# Patient Record
Sex: Female | Born: 1965
Health system: Southern US, Community
[De-identification: ages and names within clinical notes are randomized; demographics above are authoritative.]

## PROBLEM LIST (undated history)

## (undated) DIAGNOSIS — C220 Liver cell carcinoma: Secondary | ICD-10-CM

## (undated) DIAGNOSIS — R519 Headache, unspecified: Secondary | ICD-10-CM

## (undated) DIAGNOSIS — D689 Coagulation defect, unspecified: Secondary | ICD-10-CM

## (undated) DIAGNOSIS — B1920 Unspecified viral hepatitis C without hepatic coma: Secondary | ICD-10-CM

## (undated) DIAGNOSIS — K7460 Unspecified cirrhosis of liver: Secondary | ICD-10-CM

## (undated) DIAGNOSIS — R569 Unspecified convulsions: Secondary | ICD-10-CM

## (undated) DIAGNOSIS — F32A Depression, unspecified: Secondary | ICD-10-CM

## (undated) DIAGNOSIS — I10 Essential (primary) hypertension: Secondary | ICD-10-CM

## (undated) DIAGNOSIS — C3490 Malignant neoplasm of unspecified part of unspecified bronchus or lung: Secondary | ICD-10-CM

## (undated) DIAGNOSIS — F445 Conversion disorder with seizures or convulsions: Secondary | ICD-10-CM

## (undated) DIAGNOSIS — G8929 Other chronic pain: Secondary | ICD-10-CM

## (undated) DIAGNOSIS — F319 Bipolar disorder, unspecified: Secondary | ICD-10-CM

## (undated) HISTORY — DX: Malignant neoplasm of unspecified part of unspecified bronchus or lung: C34.90

## (undated) HISTORY — DX: Coagulation defect, unspecified: D68.9

## (undated) HISTORY — DX: Depression, unspecified: F32.A

## (undated) HISTORY — DX: Essential (primary) hypertension: I10

## (undated) HISTORY — DX: Other chronic pain: G89.29

## (undated) HISTORY — DX: Headache, unspecified: R51.9

---

## 2016-11-26 ENCOUNTER — Emergency Department (HOSPITAL_COMMUNITY): Payer: Self-pay

## 2016-11-26 ENCOUNTER — Observation Stay (HOSPITAL_COMMUNITY)
Admission: EM | Admit: 2016-11-26 | Discharge: 2016-11-27 | Disposition: A | Discharge status: Home / Self Care | Payer: Self-pay | Attending: Internal Medicine | Admitting: Internal Medicine

## 2016-11-26 ENCOUNTER — Encounter (HOSPITAL_COMMUNITY): Payer: Self-pay

## 2016-11-26 DIAGNOSIS — I10 Essential (primary) hypertension: Secondary | ICD-10-CM | POA: Insufficient documentation

## 2016-11-26 DIAGNOSIS — F1210 Cannabis abuse, uncomplicated: Secondary | ICD-10-CM

## 2016-11-26 DIAGNOSIS — B1920 Unspecified viral hepatitis C without hepatic coma: Secondary | ICD-10-CM | POA: Diagnosis present

## 2016-11-26 DIAGNOSIS — R9082 White matter disease, unspecified: Secondary | ICD-10-CM | POA: Insufficient documentation

## 2016-11-26 DIAGNOSIS — R748 Abnormal levels of other serum enzymes: Secondary | ICD-10-CM | POA: Diagnosis present

## 2016-11-26 DIAGNOSIS — D696 Thrombocytopenia, unspecified: Secondary | ICD-10-CM | POA: Diagnosis present

## 2016-11-26 DIAGNOSIS — Z79899 Other long term (current) drug therapy: Secondary | ICD-10-CM | POA: Insufficient documentation

## 2016-11-26 DIAGNOSIS — D649 Anemia, unspecified: Secondary | ICD-10-CM

## 2016-11-26 DIAGNOSIS — R531 Weakness: Secondary | ICD-10-CM

## 2016-11-26 DIAGNOSIS — G451 Carotid artery syndrome (hemispheric): Secondary | ICD-10-CM

## 2016-11-26 DIAGNOSIS — F17200 Nicotine dependence, unspecified, uncomplicated: Secondary | ICD-10-CM | POA: Insufficient documentation

## 2016-11-26 DIAGNOSIS — Z833 Family history of diabetes mellitus: Secondary | ICD-10-CM | POA: Insufficient documentation

## 2016-11-26 DIAGNOSIS — G43409 Hemiplegic migraine, not intractable, without status migrainosus: Principal | ICD-10-CM

## 2016-11-26 DIAGNOSIS — F445 Conversion disorder with seizures or convulsions: Secondary | ICD-10-CM | POA: Insufficient documentation

## 2016-11-26 DIAGNOSIS — Z885 Allergy status to narcotic agent status: Secondary | ICD-10-CM | POA: Insufficient documentation

## 2016-11-26 DIAGNOSIS — K7460 Unspecified cirrhosis of liver: Secondary | ICD-10-CM | POA: Diagnosis present

## 2016-11-26 DIAGNOSIS — G43909 Migraine, unspecified, not intractable, without status migrainosus: Secondary | ICD-10-CM

## 2016-11-26 DIAGNOSIS — Z886 Allergy status to analgesic agent status: Secondary | ICD-10-CM | POA: Insufficient documentation

## 2016-11-26 HISTORY — DX: Unspecified cirrhosis of liver: K74.60

## 2016-11-26 HISTORY — DX: Unspecified viral hepatitis C without hepatic coma: B19.20

## 2016-11-26 HISTORY — DX: Unspecified convulsions: R56.9

## 2016-11-26 HISTORY — DX: Conversion disorder with seizures or convulsions: F44.5

## 2016-11-26 LAB — COMPREHENSIVE METABOLIC PANEL
ALT: 135 U/L — ABNORMAL HIGH (ref 14–54)
ANION GAP: 10 (ref 5–15)
AST: 119 U/L — AB (ref 15–41)
Albumin: 3.6 g/dL (ref 3.5–5.0)
Alkaline Phosphatase: 99 U/L (ref 38–126)
BILIRUBIN TOTAL: 1.3 mg/dL — AB (ref 0.3–1.2)
BUN: 11 mg/dL (ref 6–20)
CHLORIDE: 106 mmol/L (ref 101–111)
CO2: 22 mmol/L (ref 22–32)
Calcium: 9 mg/dL (ref 8.9–10.3)
Creatinine, Ser: 0.8 mg/dL (ref 0.44–1.00)
GFR calc Af Amer: >60 mL/min (ref >60)
GFR calc non Af Amer: >60 mL/min (ref >60)
GLUCOSE: 89 mg/dL (ref 65–99)
POTASSIUM: 3.6 mmol/L (ref 3.5–5.1)
Sodium: 138 mmol/L (ref 135–145)
TOTAL PROTEIN: 6.6 g/dL (ref 6.5–8.1)

## 2016-11-26 LAB — I-STAT CHEM 8, ED
BUN: 12 mg/dL (ref 6–20)
CALCIUM ION: 1.09 mmol/L — AB (ref 1.15–1.40)
Chloride: 104 mmol/L (ref 101–111)
Creatinine, Ser: 0.7 mg/dL (ref 0.44–1.00)
Glucose, Bld: 88 mg/dL (ref 65–99)
HEMATOCRIT: 36 % (ref 36.0–46.0)
Hemoglobin: 12.2 g/dL (ref 12.0–15.0)
Potassium: 3.7 mmol/L (ref 3.5–5.1)
SODIUM: 143 mmol/L (ref 135–145)
TCO2: 26 mmol/L (ref 0–100)

## 2016-11-26 LAB — DIFFERENTIAL
BASOS ABS: 0 10*3/uL (ref 0.0–0.1)
BASOS PCT: 0 %
EOS ABS: 0.1 10*3/uL (ref 0.0–0.7)
EOS PCT: 2 %
LYMPHS ABS: 3.7 10*3/uL (ref 0.7–4.0)
Lymphocytes Relative: 68 %
Monocytes Absolute: 0.3 10*3/uL (ref 0.1–1.0)
Monocytes Relative: 6 %
NEUTROS PCT: 25 %
Neutro Abs: 1.3 10*3/uL — ABNORMAL LOW (ref 1.7–7.7)

## 2016-11-26 LAB — CBC
HCT: 34.7 % — ABNORMAL LOW (ref 36.0–46.0)
HEMOGLOBIN: 12.2 g/dL (ref 12.0–15.0)
MCH: 32.4 pg (ref 26.0–34.0)
MCHC: 35.2 g/dL (ref 30.0–36.0)
MCV: 92.3 fL (ref 78.0–100.0)
PLATELETS: 55 10*3/uL — AB (ref 150–400)
RBC: 3.76 MIL/uL — ABNORMAL LOW (ref 3.87–5.11)
RDW: 13.6 % (ref 11.5–15.5)
WBC: 5.4 10*3/uL (ref 4.0–10.5)

## 2016-11-26 LAB — CBG MONITORING, ED: GLUCOSE-CAPILLARY: 89 mg/dL (ref 65–99)

## 2016-11-26 LAB — APTT: APTT: 34 s (ref 24–36)

## 2016-11-26 LAB — I-STAT TROPONIN, ED: TROPONIN I, POC: 0 ng/mL (ref 0.00–0.08)

## 2016-11-26 LAB — AMMONIA: Ammonia: 30 umol/L (ref 9–35)

## 2016-11-26 LAB — PROTIME-INR
INR: 1.1
PROTHROMBIN TIME: 14.2 s (ref 11.4–15.2)

## 2016-11-26 MED ORDER — STROKE: EARLY STAGES OF RECOVERY BOOK
Freq: Once | Status: DC
  Filled 2016-11-26: qty 1

## 2016-11-26 MED ORDER — ACETAMINOPHEN 160 MG/5ML PO SOLN: 650.0000 mg | ORAL | Status: DC | PRN

## 2016-11-26 MED ORDER — ACETAMINOPHEN 650 MG RE SUPP: 650.0000 mg | RECTAL | Status: DC | PRN

## 2016-11-26 MED ORDER — SENNOSIDES-DOCUSATE SODIUM 8.6-50 MG PO TABS
1.0000 | ORAL_TABLET | Freq: Every evening | ORAL | Status: DC | PRN
  Filled 2016-11-26: qty 1

## 2016-11-26 MED ORDER — SODIUM CHLORIDE 0.9 % IV SOLN
INTRAVENOUS | Status: DC
  Administered 2016-11-27: 04:00:00 via INTRAVENOUS

## 2016-11-26 MED ORDER — ACETAMINOPHEN 325 MG PO TABS: 650.0000 mg | ORAL_TABLET | ORAL | Status: DC | PRN

## 2016-11-26 MED ORDER — ASPIRIN 300 MG RE SUPP: 300.0000 mg | Freq: Every day | RECTAL | Status: DC

## 2016-11-26 MED ORDER — ASPIRIN 325 MG PO TABS
325.0000 mg | ORAL_TABLET | Freq: Every day | ORAL | Status: DC
  Administered 2016-11-27: 325 mg via ORAL
  Filled 2016-11-26: qty 1

## 2016-11-26 NOTE — ED Notes (Signed)
Code Stroke cancelled @ 2049

## 2016-11-26 NOTE — ED Triage Notes (Signed)
Pt comes via [facility]. LSN 2007 R sided facial spasm and R sided weakness with abnormal gait and leg cramps today. Facial spasm resolved upon EMS arrival.

## 2016-11-26 NOTE — ED Provider Notes (Signed)
[facility] DEPT Provider Note   CSN: [id number] Arrival date & time: 11/26/16  2041   An emergency department physician performed an initial assessment on this suspected stroke patient at 2041.  History   Chief Complaint Chief Complaint  Patient presents with  . Transient Ischemic Attack    HPI Valerie [name] is a 51 y.o. female.  HPI  51 year old female history of cirrhosis and pseudoseizures who presents today via EMS as code stroke. Port is that last known normal was 8 PM. EMS was called and she has resolution of symptoms on presentation to ED. She is initially seen at the bridge with the neurologist. He cleared her as a code stroke and feels that she needs to be evaluated as a [place].  Report is that she had leg cramping and difficulty walking earlier today. She then had right-sided leg, arm and face weakness.  She is complaining of paresthesia of the rue.    Past Medical History:  Diagnosis Date  . Liver cirrhosis ([place])   . Pseudoseizures     There are no active problems to display for this patient.   Past Surgical History:  Procedure Laterality Date  . CESAREAN SECTION      OB History    No data available       Home Medications    Prior to Admission medications   Not on File    Family History No family history on file.  Social History Social History  Substance Use Topics  . Smoking status: Current Every Day Smoker  . Smokeless tobacco: Never Used  . Alcohol use No     Allergies   Patient has no known allergies.   Review of Systems Review of Systems  All other systems reviewed and are negative.    Physical Exam Updated Vital Signs BP (!) 161/97   Pulse 72   Temp 97.8 F (36.6 C)   Resp 18   Ht 1.676 m (5\' 6" )   Wt 66.7 kg (147 lb)   SpO2 100%   BMI 23.73 kg/m   Physical Exam  Constitutional: She is oriented to person, place, and time. She appears well-developed and well-nourished. No distress.  HENT:  Head: Normocephalic and  atraumatic.  Right Ear: External ear normal.  Left Ear: External ear normal.  Nose: Nose normal.  Mouth/Throat: Oropharynx is clear and moist.  Eyes: Pupils are equal, round, and reactive to light. EOM are normal.  Neck: Normal range of motion. Neck supple.  Cardiovascular: Normal rate and regular rhythm.   Pulmonary/Chest: Effort normal and breath sounds normal.  Abdominal: Soft. Bowel sounds are normal.  Musculoskeletal: Normal range of motion.  Neurological: She is alert and oriented to person, place, and time. She displays normal reflexes. No cranial nerve deficit or sensory deficit. She exhibits normal muscle tone. Coordination normal.  Patient with some decreased strength rle >lle, mild palmar drift. No sensory deficit  Skin: Skin is warm and dry. Capillary refill takes less than 2 seconds.  Psychiatric: She has a normal mood and affect.  Nursing note and vitals reviewed.    ED Treatments / Results  Labs (all labs ordered are listed, but only abnormal results are displayed) Labs Reviewed  CBC - Abnormal; Notable for the following:       Result Value   RBC 3.76 (*)    HCT 34.7 (*)    All other components within normal limits  DIFFERENTIAL - Abnormal; Notable for the following:    Neutro Abs 1.3 (*)  All other components within normal limits  COMPREHENSIVE METABOLIC PANEL - Abnormal; Notable for the following:    AST 119 (*)    ALT 135 (*)    Total Bilirubin 1.3 (*)    All other components within normal limits  I-STAT CHEM 8, ED - Abnormal; Notable for the following:    Calcium, Ion 1.09 (*)    All other components within normal limits  APTT  PROTIME-INR  AMMONIA  I-STAT TROPONIN, ED  CBG MONITORING, ED    EKG  EKG Interpretation  Date/Time:  Saturday November 26 2016 21:04:51 EDT Ventricular Rate:  70 PR Interval:    QRS Duration: 93 QT Interval:  439 QTC Calculation: 474 R Axis:   117 Text Interpretation:  Right and left arm electrode reversal,  interpretation assumes no reversal Sinus or ectopic atrial rhythm Short PR interval Probable lateral infarct, age indeterminate Confirmed by Pattricia Boss [phone]) on 11/26/2016 10:20:49 PM       Radiology Ct Head Code Stroke Wo Contrast  Result Date: 11/26/2016 CLINICAL DATA:  Code stroke. Facial droop and facial weakness. Code stroke. EXAM: CT HEAD WITHOUT CONTRAST TECHNIQUE: Contiguous axial images were obtained from the base of the skull through the vertex without intravenous contrast. COMPARISON:  None. FINDINGS: Brain: The brain has normal appearance without evidence of atrophy, old or acute infarction, mass lesion, hemorrhage, hydrocephalus or extra-axial collection. Vascular: There is atherosclerotic calcification of the major vessels at the base of the brain. Skull: Normal Sinuses/Orbits: Clear/normal Other: None ASPECTS ([place] Stroke Program Early CT Score) - Ganglionic level infarction (caudate, lentiform nuclei, internal capsule, insula, M1-M3 cortex): 7 - Supraganglionic infarction (M4-M6 cortex): 3 Total score (0-10 with 10 being normal): 10 IMPRESSION: 1. Normal parenchymal head CT. The patient does have some calcification of the major vessels at the base of the brain, premature for age. 2. ASPECTS is 10. Page placed at the time of interpretation on 11/26/2016 at 9:00 pm to Dr. Cheral Marker. Electronically Signed   By: Nelson Chimes M.D.   On: 11/26/2016 21:02    Procedures Procedures (including critical care time)  Medications Ordered in ED Medications - No data to display   Initial Impression / Assessment and Plan / ED Course  I have reviewed the triage vital signs and the nursing notes.  Pertinent labs & imaging results that were available during my care of the patient were reviewed by me and considered in my medical decision making (see chart for details).     51 year old female presents with episode of right-sided weakness. Symptoms had resolved prior to evaluation although  appears to have some mild right upper extremity and right lower extremity weakness patient seen and evaluated by neurology on arrival. Dr. Cheral Marker advises that no acute intervention is indicated-specifically TPA. On my reevaluation, patient appears to have no acute neurological deficit. Review of head CT shows normal parenchymal head CT, although some calcification of the major vessels at the base of the brain premature for age.   Vitals:   11/26/16 2200 11/26/16 2230  BP: (!) 115/59 111/76  Pulse: 72 74  Resp: 15 18  Temp:    SpO2: 97% 98%   Consult placed for mission for TIA evaluation. Final Clinical Impressions(s) / ED Diagnoses   Final diagnoses:  Hemispheric carotid artery syndrome    New Prescriptions New Prescriptions   No medications on file     Pattricia Boss, MD 11/27/16 1614

## 2016-11-26 NOTE — H&P (Signed)
History and Physical    [name] KWI:[id number] DOB: 11/08/1967 DOA: 11/26/2016  Referring MD/NP/PA: Dr. Pattricia Boss PCP: No primary care provider on file.  Patient coming from:   via EMS   Chief Complaint: Right-sided weakness  HPI: Valerie [name] is a 51 y.o. female with medical history significant of pseudo-seizures, hepatitis C, liver cirrhosis; who presents with complaints of right-sided weakness which start while she was moving out of her current place of residence sometime after 8 PM last night. Patient is a poor historian. She reports having facial spasms on the right as well as inability to move her right arm or leg. Denies any alcohol or illicit drug use. She did not report any palpitations, loss of consciousness, change in vision, or speech.  She reports having a history of hepatitis C for which she states she's been treated previously at [facility] in [place], [place] , and/or [facility]. Denies having any nausea, vomiting, or bleeding.  ED Course: Patient was brought in with EMS as a code stroke. Upon admission to the emergency department patient was seen to be afebrile with vital signs relatively within normal limits. Dr. Cheral Marker of neurology saw the patient and some of patient's symptoms were improving. It was recommended TRH admit for completion of TIA workup. Labs revealed plate count of 55, AST 119, ALT 135, total bilirubin 1.3. CT scan of the brain showed no acute abnormalities.  Review of Systems: Review of Systems  Constitutional: Negative for chills, fever and malaise/fatigue.  HENT: Negative for ear pain and tinnitus.   Eyes: Negative for double vision.  Respiratory: Negative for sputum production and shortness of breath.   Cardiovascular: Positive for chest pain. Negative for leg swelling.  Gastrointestinal: Negative for abdominal pain, diarrhea and vomiting.  Genitourinary: Negative for dysuria and flank pain.  Musculoskeletal: Negative for falls.  Skin:  Negative for itching and rash.  Neurological: Positive for sensory change and focal weakness.  Psychiatric/Behavioral: Negative for memory loss and substance abuse.    Past Medical History:  Diagnosis Date  . Liver cirrhosis ([place])   . Pseudoseizures     Past Surgical History:  Procedure Laterality Date  . CESAREAN SECTION       reports that she has been smoking.  She has never used smokeless tobacco. She reports that she does not drink alcohol or use drugs.  No Known Allergies  Family History  Problem Relation Age of Onset  . Heart disease Neg Hx   Positive family history for diabetes  Prior to Admission medications   None     Physical Exam:  Constitutional: Female who appears older than stated age is currently resting, but easily awakened and will follow commands. Vitals:   11/26/16 2130 11/26/16 2200 11/26/16 2230 11/26/16 2241  BP: 131/78 (!) 115/59 111/76   Pulse: 75 72 74   Resp: 15 15 18    Temp:    97.6 F (36.4 C)  SpO2: 97% 97% 98%   Weight:      Height:       Eyes: PERRL, lids and conjunctivae normal ENMT: Mucous membranes are moist. Posterior pharynx clear of any exudate or lesions.Poor dentition.  Neck: normal, supple, no masses, no thyromegaly Respiratory: clear to auscultation bilaterally, no wheezing, no crackles. Normal respiratory effort. No accessory muscle use.  Cardiovascular: Regular rate and rhythm, no murmurs / rubs / gallops. No extremity edema. 2+ pedal pulses. No carotid bruits.  Abdomen: no tenderness, no masses palpated. No hepatosplenomegaly. Bowel sounds positive.  Musculoskeletal: no clubbing / cyanosis. No joint deformity upper and lower extremities. Good ROM, no contractures. Normal muscle tone.  Skin: no rashes, lesions, ulcers. No induration Neurologic: CN 2-12 grossly intact. Sensation intact, DTR normal. Strength 5/5 on the left upper and lower extremity. Strength 4/5 on the right upper extremity and 4/5 on the left lower  extremity with positive drift. Psychiatric: Normal judgment and insight. Alert and oriented x 3. Normal mood.     Labs on Admission: I have personally reviewed following labs and imaging studies  CBC:  Recent Labs Lab 11/26/16 2043 11/26/16 2055  WBC 5.4  --   NEUTROABS 1.3*  --   HGB 12.2 12.2  HCT 34.7* 36.0  MCV 92.3  --   PLT 55*  --    Basic Metabolic Panel:  Recent Labs Lab 11/26/16 2043 11/26/16 2055  NA 138 143  K 3.6 3.7  CL 106 104  CO2 22  --   GLUCOSE 89 88  BUN 11 12  CREATININE 0.80 0.70  CALCIUM 9.0  --    GFR: Estimated Creatinine Clearance: 79.6 mL/min (by C-G formula based on SCr of 0.7 mg/dL). Liver Function Tests:  Recent Labs Lab 11/26/16 2043  AST 119*  ALT 135*  ALKPHOS 99  BILITOT 1.3*  PROT 6.6  ALBUMIN 3.6   No results for input(s): LIPASE, AMYLASE in the last 168 hours.  Recent Labs Lab 11/26/16 2052  AMMONIA 30   Coagulation Profile:  Recent Labs Lab 11/26/16 2039  INR 1.10   Cardiac Enzymes: No results for input(s): CKTOTAL, CKMB, CKMBINDEX, TROPONINI in the last 168 hours. BNP (last 3 results) No results for input(s): PROBNP in the last 8760 hours. HbA1C: No results for input(s): HGBA1C in the last 72 hours. CBG:  Recent Labs Lab 11/26/16 2044  GLUCAP 89   Lipid Profile: No results for input(s): CHOL, HDL, LDLCALC, TRIG, CHOLHDL, LDLDIRECT in the last 72 hours. Thyroid Function Tests: No results for input(s): TSH, T4TOTAL, FREET4, T3FREE, THYROIDAB in the last 72 hours. Anemia Panel: No results for input(s): VITAMINB12, FOLATE, FERRITIN, TIBC, IRON, RETICCTPCT in the last 72 hours. Urine analysis: No results found for: COLORURINE, APPEARANCEUR, LABSPEC, PHURINE, GLUCOSEU, HGBUR, BILIRUBINUR, KETONESUR, PROTEINUR, UROBILINOGEN, NITRITE, LEUKOCYTESUR Sepsis Labs: No results found for this or any previous visit (from the past 240 hour(s)).   Radiological Exams on Admission: Ct Head Code Stroke Wo  Contrast  Result Date: 11/26/2016 CLINICAL DATA:  Code stroke. Facial droop and facial weakness. Code stroke. EXAM: CT HEAD WITHOUT CONTRAST TECHNIQUE: Contiguous axial images were obtained from the base of the skull through the vertex without intravenous contrast. COMPARISON:  None. FINDINGS: Brain: The brain has normal appearance without evidence of atrophy, old or acute infarction, mass lesion, hemorrhage, hydrocephalus or extra-axial collection. Vascular: There is atherosclerotic calcification of the major vessels at the base of the brain. Skull: Normal Sinuses/Orbits: Clear/normal Other: None ASPECTS ([place] Stroke Program Early CT Score) - Ganglionic level infarction (caudate, lentiform nuclei, internal capsule, insula, M1-M3 cortex): 7 - Supraganglionic infarction (M4-M6 cortex): 3 Total score (0-10 with 10 being normal): 10 IMPRESSION: 1. Normal parenchymal head CT. The patient does have some calcification of the major vessels at the base of the brain, premature for age. 2. ASPECTS is 10. Page placed at the time of interpretation on 11/26/2016 at 9:00 pm to Dr. Cheral Marker. Electronically Signed   By: Nelson Chimes M.D.   On: 11/26/2016 21:02    EKG: Independently reviewed. Sinus rhythm  Assessment/Plan  Right-sided weakness: Acute. Patient presents with right-sided weakness similar symptoms appear to be resolving at this time. Initial CT scan of brain negative for any signs of acute stroke. Neurology was consulted and recommended completion of stroke workup. - Admit to telemetry bed - Stroke order set initiated - Neuro checks -  Follow up  MRI  head w/o contrast - PT/OT/Speech to eval and treat - Check echocardiogram and Vas carotid U/S - Check Hemoglobin A1c and lipid panel in a.m. - ASA - Appreciate neurology consultative services, will follow-up   History of hepatitis C, Liver cirrhosis with elevated liver enzymes: Patient presents with elevated AST, ALT, and total bilirubin on admission.  Ammonia level noted to be within normal limits. Unable to find outside records on car everywhere.  - Check acute hepatitis panel - May warrant further investigation if unable to find outside records  Thrombocytopenia: Initial platelet count 55. No previous lab work available at this time to compare. - Recheck CBC in a.m.  DVT prophylaxis: SCDs Code Status: Full Family Communication: Discuss plan of care with the patient and family present at bedside Disposition Plan: TBD Consults called: Neurology  Admission status: Observation  Norval Morton MD [facility] Pager [phone]   If 7PM-7AM, please contact night-coverage [email or web address] Password [email or web address]  11/26/2016, 11:19 PM

## 2016-11-27 ENCOUNTER — Observation Stay (HOSPITAL_COMMUNITY): Payer: Self-pay

## 2016-11-27 ENCOUNTER — Encounter (HOSPITAL_COMMUNITY): Payer: Self-pay | Admitting: Internal Medicine

## 2016-11-27 DIAGNOSIS — R531 Weakness: Secondary | ICD-10-CM

## 2016-11-27 DIAGNOSIS — K7460 Unspecified cirrhosis of liver: Secondary | ICD-10-CM | POA: Diagnosis present

## 2016-11-27 DIAGNOSIS — R748 Abnormal levels of other serum enzymes: Secondary | ICD-10-CM | POA: Diagnosis present

## 2016-11-27 DIAGNOSIS — G451 Carotid artery syndrome (hemispheric): Secondary | ICD-10-CM

## 2016-11-27 DIAGNOSIS — D649 Anemia, unspecified: Secondary | ICD-10-CM

## 2016-11-27 DIAGNOSIS — G43909 Migraine, unspecified, not intractable, without status migrainosus: Secondary | ICD-10-CM

## 2016-11-27 DIAGNOSIS — F1210 Cannabis abuse, uncomplicated: Secondary | ICD-10-CM

## 2016-11-27 DIAGNOSIS — G43409 Hemiplegic migraine, not intractable, without status migrainosus: Secondary | ICD-10-CM

## 2016-11-27 DIAGNOSIS — D696 Thrombocytopenia, unspecified: Secondary | ICD-10-CM

## 2016-11-27 DIAGNOSIS — B1920 Unspecified viral hepatitis C without hepatic coma: Secondary | ICD-10-CM

## 2016-11-27 LAB — CBC WITH DIFFERENTIAL/PLATELET
BASOS ABS: 0 10*3/uL (ref 0.0–0.1)
BASOS PCT: 0 %
EOS ABS: 0.1 10*3/uL (ref 0.0–0.7)
Eosinophils Relative: 1 %
HCT: 34.3 % — ABNORMAL LOW (ref 36.0–46.0)
HEMOGLOBIN: 11.7 g/dL — AB (ref 12.0–15.0)
Lymphocytes Relative: 74 %
Lymphs Abs: 4.3 10*3/uL — ABNORMAL HIGH (ref 0.7–4.0)
MCH: 31 pg (ref 26.0–34.0)
MCHC: 34.1 g/dL (ref 30.0–36.0)
MCV: 91 fL (ref 78.0–100.0)
Monocytes Absolute: 0.4 10*3/uL (ref 0.1–1.0)
Monocytes Relative: 6 %
NEUTROS PCT: 19 %
Neutro Abs: 1.1 10*3/uL — ABNORMAL LOW (ref 1.7–7.7)
Platelets: 52 10*3/uL — ABNORMAL LOW (ref 150–400)
RBC: 3.77 MIL/uL — ABNORMAL LOW (ref 3.87–5.11)
RDW: 13.5 % (ref 11.5–15.5)
WBC: 5.8 10*3/uL (ref 4.0–10.5)

## 2016-11-27 LAB — RAPID URINE DRUG SCREEN, HOSP PERFORMED
Amphetamines: NOT DETECTED
Barbiturates: NOT DETECTED
Benzodiazepines: NOT DETECTED
COCAINE: NOT DETECTED
OPIATES: NOT DETECTED
TETRAHYDROCANNABINOL: POSITIVE — AB

## 2016-11-27 LAB — TROPONIN I: Troponin I: <0.03 ng/mL (ref <0.03)

## 2016-11-27 MED ORDER — KETOROLAC TROMETHAMINE 30 MG/ML IJ SOLN
30.0000 mg | Freq: Once | INTRAMUSCULAR | Status: AC
  Administered 2016-11-27: 30 mg via INTRAVENOUS
  Filled 2016-11-27: qty 1

## 2016-11-27 MED ORDER — ASPIRIN EC 81 MG PO TBEC: 81.0000 mg | DELAYED_RELEASE_TABLET | Freq: Every day | ORAL | 0 refills | Status: AC

## 2016-11-27 MED ORDER — MAGNESIUM SULFATE IN D5W 1-5 GM/100ML-% IV SOLN
1.0000 g | Freq: Once | INTRAVENOUS | Status: AC
  Administered 2016-11-27: 1 g via INTRAVENOUS
  Filled 2016-11-27: qty 100

## 2016-11-27 NOTE — ED Notes (Signed)
Neurologist at bedside. 

## 2016-11-27 NOTE — Consult Note (Addendum)
Referring Physician: Dr. Tamala Julian    Chief Complaint: Right facial droop with RUE and RLE weakness  HPI: Valerie [name] is an 51 y.o. female who was in her USOH until 8:07 PM, when her daughter noticed a right sided facial spasm in conjunction with abnormal gait. The patient states that with the above, her RUE and RLE were weak. She notes that she has had bilateral leg cramps today. Symptoms of right sided weakness were still present during EMS transport, but had resolved upon arrival to the ED, with subtle residual RLE weakness detectable on exam.   LSN: 2007 tPA Given: No: Symptoms resolved except for minimal residual RLE deficit  Past Medical History:  Diagnosis Date  . Liver cirrhosis ([place])   . Pseudoseizures     Past Surgical History:  Procedure Laterality Date  . CESAREAN SECTION      No family history on file. Social History:  reports that she has been smoking.  She has never used smokeless tobacco. She reports that she does not drink alcohol or use drugs.  Allergies: No Known Allergies  Home Medications:  No home medications listed in EPIC  ROS: RUE paresthesias. Other ROS as per HPI,  Physical Examination: Blood pressure 118/71, pulse 69, temperature 97.6 F (36.4 C), resp. rate 16, height '5\' 6"'$  (1.676 m), weight 66.7 kg (147 lb), SpO2 98 %.  HEENT: [place]/AT Lungs: Respirations unlabored Ext: No edema  Neurologic Examination: Mental Status: Alert, oriented, thought content appropriate.  Speech fluent without evidence of aphasia.  Able to follow all commands without difficulty. Cranial Nerves: II:  Visual fields intact, PERRL  III,IV, VI: ptosis not present, EOMI without nystagmus V,VII: smile symmetric, facial temp sensation normal bilaterally VIII: hearing intact to conversation IX,X: no hypophonia XI: symmetric XII: midline tongue extension  Motor: Right : Upper extremity   5/5    Left:     Upper extremity   5/5  Lower extremity   4+/5     Lower extremity    5/5 Tone and bulk: Normal tone throughout; no atrophy noted Sensory: Temp and light touch intact throughout, bilaterally. No extinction. Deep Tendon Reflexes:  2+ bilateral upper and lower extremities. Toes downgoing bilaterally.  Cerebellar: No ataxia with FNF bilaterally. Gait: Deferred  Results for orders placed or performed during the hospital encounter of 11/26/16 (from the past 48 hour(s))  Protime-INR     Status: None   Collection Time: 11/26/16  8:39 PM  Result Value Ref Range   Prothrombin Time 14.2 11.4 - 15.2 seconds   INR 1.10   APTT     Status: None   Collection Time: 11/26/16  8:43 PM  Result Value Ref Range   aPTT 34 24 - 36 seconds  CBC     Status: Abnormal   Collection Time: 11/26/16  8:43 PM  Result Value Ref Range   WBC 5.4 4.0 - 10.5 K/uL   RBC 3.76 (L) 3.87 - 5.11 MIL/uL   Hemoglobin 12.2 12.0 - 15.0 g/dL   HCT 34.7 (L) 36.0 - 46.0 %   MCV 92.3 78.0 - 100.0 fL   MCH 32.4 26.0 - 34.0 pg   MCHC 35.2 30.0 - 36.0 g/dL   RDW 13.6 11.5 - 15.5 %   Platelets 55 (L) 150 - 400 K/uL    Comment: REPEATED TO VERIFY PLATELET COUNT CONFIRMED BY SMEAR   Differential     Status: Abnormal   Collection Time: 11/26/16  8:43 PM  Result Value Ref Range  Neutrophils Relative % 25 %   Neutro Abs 1.3 (L) 1.7 - 7.7 K/uL   Lymphocytes Relative 68 %   Lymphs Abs 3.7 0.7 - 4.0 K/uL   Monocytes Relative 6 %   Monocytes Absolute 0.3 0.1 - 1.0 K/uL   Eosinophils Relative 2 %   Eosinophils Absolute 0.1 0.0 - 0.7 K/uL   Basophils Relative 0 %   Basophils Absolute 0.0 0.0 - 0.1 K/uL  Comprehensive metabolic panel     Status: Abnormal   Collection Time: 11/26/16  8:43 PM  Result Value Ref Range   Sodium 138 135 - 145 mmol/L   Potassium 3.6 3.5 - 5.1 mmol/L   Chloride 106 101 - 111 mmol/L   CO2 22 22 - 32 mmol/L   Glucose, Bld 89 65 - 99 mg/dL   BUN 11 6 - 20 mg/dL   Creatinine, Ser 0.80 0.44 - 1.00 mg/dL   Calcium 9.0 8.9 - 10.3 mg/dL   Total Protein 6.6 6.5 - 8.1 g/dL    Albumin 3.6 3.5 - 5.0 g/dL   AST 119 (H) 15 - 41 U/L   ALT 135 (H) 14 - 54 U/L   Alkaline Phosphatase 99 38 - 126 U/L   Total Bilirubin 1.3 (H) 0.3 - 1.2 mg/dL   GFR calc non Af Amer >60 >60 mL/min   GFR calc Af Amer >60 >60 mL/min    Comment: (NOTE) The eGFR has been calculated using the CKD EPI equation. This calculation has not been validated in all clinical situations. eGFR's persistently <60 mL/min signify possible Chronic Kidney Disease.    Anion gap 10 5 - 15  CBG monitoring, ED     Status: None   Collection Time: 11/26/16  8:44 PM  Result Value Ref Range   Glucose-Capillary 89 65 - 99 mg/dL  Ammonia     Status: None   Collection Time: 11/26/16  8:52 PM  Result Value Ref Range   Ammonia 30 9 - 35 umol/L  I-stat troponin, ED     Status: None   Collection Time: 11/26/16  8:53 PM  Result Value Ref Range   Troponin i, poc 0.00 0.00 - 0.08 ng/mL   Comment 3            Comment: Due to the release kinetics of cTnI, a negative result within the first hours of the onset of symptoms does not rule out myocardial infarction with certainty. If myocardial infarction is still suspected, repeat the test at appropriate intervals.   I-Stat Chem 8, ED     Status: Abnormal   Collection Time: 11/26/16  8:55 PM  Result Value Ref Range   Sodium 143 135 - 145 mmol/L   Potassium 3.7 3.5 - 5.1 mmol/L   Chloride 104 101 - 111 mmol/L   BUN 12 6 - 20 mg/dL   Creatinine, Ser 0.70 0.44 - 1.00 mg/dL   Glucose, Bld 88 65 - 99 mg/dL   Calcium, Ion 1.09 (L) 1.15 - 1.40 mmol/L   TCO2 26 0 - 100 mmol/L   Hemoglobin 12.2 12.0 - 15.0 g/dL   HCT 36.0 36.0 - 46.0 %   Ct Head Code Stroke Wo Contrast  Result Date: 11/26/2016 CLINICAL DATA:  Code stroke. Facial droop and facial weakness. Code stroke. EXAM: CT HEAD WITHOUT CONTRAST TECHNIQUE: Contiguous axial images were obtained from the base of the skull through the vertex without intravenous contrast. COMPARISON:  None. FINDINGS: Brain: The brain has  normal appearance without evidence of atrophy,  old or acute infarction, mass lesion, hemorrhage, hydrocephalus or extra-axial collection. Vascular: There is atherosclerotic calcification of the major vessels at the base of the brain. Skull: Normal Sinuses/Orbits: Clear/normal Other: None ASPECTS ([place] Stroke Program Early CT Score) - Ganglionic level infarction (caudate, lentiform nuclei, internal capsule, insula, M1-M3 cortex): 7 - Supraganglionic infarction (M4-M6 cortex): 3 Total score (0-10 with 10 being normal): 10 IMPRESSION: 1. Normal parenchymal head CT. The patient does have some calcification of the major vessels at the base of the brain, premature for age. 2. ASPECTS is 10. Page placed at the time of interpretation on 11/26/2016 at 9:00 pm to Dr. Cheral Marker. Electronically Signed   By: Nelson Chimes M.D.   On: 11/26/2016 21:02    Assessment: 51 y.o. female with acute onset of right facial droop with RUE and RLE weakness. 1. CT head without acute abnormality.  2. Hepatitis C with liver cirrhosis 3. History of pseudoseizures  Plan: 1. HgbA1c, fasting lipid panel 2. MRI, MRA of the brain without contrast 3. PT consult, OT consult, Speech consult 4. Echocardiogram 5. Carotid dopplers 6. Start ASA 81 mg po qd 7. Risk factor modification 8. Telemetry monitoring 9. Frequent neuro checks 10. Permissive HTN x 24 hours 11. Given Hepatitis C with liver cirrhosis, benefits of statin may be outweighed by risks  '@Electronically'$  signed: Dr. Kerney Elbe  11/27/2016, 12:27 AM

## 2016-11-27 NOTE — Discharge Summary (Signed)
Physician Discharge Summary  Valerie [name] ZOX:[id number] DOB: 11/08/1967 DOA: 11/26/2016  PCP: Patient, No Pcp Per  Admit date: 11/26/2016 Discharge date: 11/27/2016  Admitted From: Home Disposition:  Home  Recommendations for Outpatient Follow-up:  1. Follow up with and establish with PCP in 1-2 weeks; Case Management consulted to arrange Follow Up 2. Outpatient follow up for Elevated LFT's by getting U/S of the Abdomen and Acute Hepatitis Panel 3. Outpatient follow up and workup  for Thrombocytopenia and Normocytic Anemia 4. Please obtain CMP/CBC, Mag, Phos in one week 5. Follow up with Neurology as an outpatient. Ambulatory Referral to [facility] sent  Home Health: No  Equipment/Devices: None  Discharge Condition: Stable CODE STATUS: FULL CODE Diet recommendation: Heart Healthy Diet  Brief/Interim Summary: Valerie [name] is a 51 y.o. female with medical history significant of pseudo-seizures, hepatitis C, liver cirrhosis; who presents with complaints of right-sided weakness which start while she was moving out of her current place of residence sometime after 8 PM last night. Patient is a poor historian. She reports having facial spasms on the right as well as inability to move her right arm or leg. Denies any alcohol or illicit drug use. She did not report any palpitations, loss of consciousness, change in vision, or speech.  She reports having a history of hepatitis C for which she states she's been treated previously at [facility] in [place], [place] , and/or [facility]. Denies having any nausea, vomiting, or bleeding. Patient was brought in with EMS as a code stroke. Upon admission to the emergency department patient was seen to be afebrile with vital signs relatively within normal limits. Dr. Cheral Marker of neurology saw the patient and some of patient's symptoms were improving. It was recommended [facility] admit for completion of TIA workup. Labs revealed plate count of 55, AST 119, ALT 135, total  bilirubin 1.3. CT scan of the brain showed no acute abnormalities. MRI and MRA was negative. Neurology Dr. Lorraine Lax saw the patient and felt that it was not a TIA and felt as if her symptoms were related to a Complex Migraine. She was given IV Toradol and IV Magnesium with improvement of her symptoms. She was given ASA 81 mg script at D/C at the recommendation of Neurology. A referral to Ambulatory Neurology was made. Care Management was consulted to help get patient established with a PCP as patient had no insurance. She was deemed medically stable at this time as symptoms improved and will be D/C'd Home.   Discharge Diagnoses:  Principal Problem:   Right sided weakness Active Problems:   Hepatitis C   Thrombocytopenia (HCC)   Liver cirrhosis (HCC)   Elevated liver enzymes   Migraine   Hemiplegic migraine   Marijuana abuse   Normocytic anemia  Right-sided weakness 2/2 to Complex Migraine; Improved and essentially Resolved  -Acute. Patient presents with right-sided weakness similar symptoms appear to be resolving at this time. Initial CT scan of brain negative for any signs of acute stroke. Neurology was consulted and recommended completion of stroke workup. -MRI of Brain done and showed No acute intracranial process.  Minimal white matter changes most compatible with chronic small vessel ischemic disease -MRA showed symmetric carotid and vertebral arteries. Fetal type left PCA. No branch occlusion, beading, stenosis, or aneurysm and was read as a Negative intracranial MRA -Stroke order set initiated but Neurology Consulted and recommended D/Cing as MRI/MRA were negative -Given IV Ketorolac 30 mg and 1 gram of IV Magnesium with improvement in Symptoms -Neurology  recommending ASA 81 mg as an outpatient and no Migraine prophylaxis -Ambulatory referral made to [facility] Neurology   -Patient has Hx of Pseudoseizures -Hemoglobin A1c and lipid panel can be done as an outpatient -Care Management consulted  to have patient establish with PCP  History of Hepatitis C, Liver Cirrhosis With elevated Liver enzymes:  -Liver Cirrhosis does not seem Decompensated -Patient presents with elevated AST, ALT, and total bilirubin on admission.  -AST was 119, ALT was 135, and T Bili was 1.3 -Ammonia level noted to be within normal limits at 30. Unable to find outside records on care everywhere.  -Outpatient work up with Acute Hepatitis Panel and RUQ of Abdomen -Patient not complaining of any Abdominal Pain -Will need to establish with PCP and follow up within 1-2 weeks and Care Management consulted to assist in PCP follow Up  Thrombocytopenia: -Initial platelet count 55 and repeat was 52. No previous lab work available at this time to compare. -Not Actively Bleeding -Likely from Cirrhosis  -Establish with and follow up with PCP; Care Management Consulted to help. Per Nurse Care Management not here on weekends but will call patient to establish PCP follow up.  Marijuana Abuse -UDS Positive for THC -Counseling Given  Normocytic Anemia -Mild as Hb/Hct went from 12.2/34.7 -> 11.7/34.3 -Outpatient workup  -Will need to follow up with and Establish with PCP  Discharge Instructions  Discharge Instructions    Ambulatory referral to Neurology    Complete by:  As directed    An appointment is requested in approximately: 1-2 Weeks; Complex Migraine   Call MD for:  difficulty breathing, headache or visual disturbances    Complete by:  As directed    Call MD for:  extreme fatigue    Complete by:  As directed    Call MD for:  hives    Complete by:  As directed    Call MD for:  persistant dizziness or light-headedness    Complete by:  As directed    Call MD for:  persistant nausea and vomiting    Complete by:  As directed    Call MD for:  redness, tenderness, or signs of infection (pain, swelling, redness, odor or green/yellow discharge around incision site)    Complete by:  As directed    Call MD for:   severe uncontrolled pain    Complete by:  As directed    Call MD for:  temperature >100.4    Complete by:  As directed    Diet - low sodium heart healthy    Complete by:  As directed    Discharge instructions    Complete by:  As directed    Follow up with PCP at D/C and take all medications as prescribed. If symptoms change or worsen please return to the ED for evaluation.   Increase activity slowly    Complete by:  As directed      Allergies as of 11/27/2016      Reactions   Norco [hydrocodone-acetaminophen] Anaphylaxis, Shortness Of Breath   Tylenol [acetaminophen] Other (See Comments)   Cannot take due to liver      Medication List    TAKE these medications   aspirin EC 81 MG tablet Take 1 tablet (81 mg total) by mouth daily.       Allergies  Allergen Reactions  . Norco [Hydrocodone-Acetaminophen] Anaphylaxis and Shortness Of Breath  . Tylenol [Acetaminophen] Other (See Comments)    Cannot take due to liver   Consultations:  Neurology Dr. Judyann Munson. [place]  Procedures/Studies: Mr Jodene Nam Head Wo Contrast  Result Date: 11/27/2016 CLINICAL DATA:  TIA. Acute onset of right facial spasm abnormal gait and right-sided weakness. EXAM: MRA HEAD WITHOUT CONTRAST TECHNIQUE: Angiographic images of the Circle of Willis were obtained using MRA technique without intravenous contrast. COMPARISON:  Brain MRI from earlier today FINDINGS: Symmetric carotid and vertebral arteries. Fetal type left PCA. No branch occlusion, beading, stenosis, or aneurysm. IMPRESSION: Negative intracranial MRA. Electronically Signed   By: Monte Fantasia M.D.   On: 11/27/2016 09:06   Mr Brain Wo Contrast  Result Date: 11/27/2016 CLINICAL DATA:  Acute onset RIGHT facial spasm, abnormal gait and RIGHT-sided weakness. Last seen normal at 2007 hours. History of cirrhosis and pseudo seizures. EXAM: MRI HEAD WITHOUT CONTRAST TECHNIQUE: Multiplanar, multiecho pulse sequences of the brain and surrounding structures  were obtained without intravenous contrast. COMPARISON:  CT HEAD November 26, 2016 FINDINGS: BRAIN: No reduced diffusion to suggest acute ischemia, hyperacute demyelination or postictal state. No susceptibility artifact to suggest hemorrhage. The ventricles and sulci are normal for patient's age. Scattered punctate supratentorial white matter FLAIR T2 hyperintensities in a nonspecific distribution. No suspicious parenchymal signal, mass or mass effect. No abnormal extra-axial fluid collections. VASCULAR: Normal major intracranial vascular flow voids present at skull base. SKULL AND UPPER CERVICAL SPINE: No abnormal sellar expansion. No suspicious calvarial bone marrow signal. Craniocervical junction maintained. SINUSES/ORBITS: The mastoid air-cells and included paranasal sinuses are well-aerated. The included ocular globes and orbital contents are non-suspicious. OTHER: Absent maxillary teeth. IMPRESSION: 1. No acute intracranial process. 2. Minimal white matter changes most compatible with chronic small vessel ischemic disease. Electronically Signed   By: Elon Alas M.D.   On: 11/27/2016 02:57   Ct Head Code Stroke Wo Contrast  Result Date: 11/26/2016 CLINICAL DATA:  Code stroke. Facial droop and facial weakness. Code stroke. EXAM: CT HEAD WITHOUT CONTRAST TECHNIQUE: Contiguous axial images were obtained from the base of the skull through the vertex without intravenous contrast. COMPARISON:  None. FINDINGS: Brain: The brain has normal appearance without evidence of atrophy, old or acute infarction, mass lesion, hemorrhage, hydrocephalus or extra-axial collection. Vascular: There is atherosclerotic calcification of the major vessels at the base of the brain. Skull: Normal Sinuses/Orbits: Clear/normal Other: None ASPECTS ([place] Stroke Program Early CT Score) - Ganglionic level infarction (caudate, lentiform nuclei, internal capsule, insula, M1-M3 cortex): 7 - Supraganglionic infarction (M4-M6 cortex): 3  Total score (0-10 with 10 being normal): 10 IMPRESSION: 1. Normal parenchymal head CT. The patient does have some calcification of the major vessels at the base of the brain, premature for age. 2. ASPECTS is 10. Page placed at the time of interpretation on 11/26/2016 at 9:00 pm to Dr. Cheral Marker. Electronically Signed   By: Nelson Chimes M.D.   On: 11/26/2016 21:02    Subjective: Seen and examined and symptoms had essentially resolved. Patient stated the heaviness feeling in her arm totally resolved. Still had minor tingling in fingers but had significantly improved. Also stated headache was improving. No CP or SOB. No abdominal Pain and denied any bloody stools. Ready to go home.   Discharge Exam: Vitals:   11/27/16 1040 11/27/16 1045  BP: (!) 140/96   Pulse: 70 61  Resp: 18 18  Temp:    SpO2: 98% 98%   Vitals:   11/27/16 0930 11/27/16 0945 11/27/16 1040 11/27/16 1045  BP: (!) 150/84 131/82 (!) 140/96   Pulse: 69 71 70 61  Resp: 14 18 18  18  Temp:      SpO2: 98% 98% 98% 98%  Weight:      Height:       General: Pt is alert, awake, not in acute distress Cardiovascular: RRR, S1/S2 +, no rubs, no gallops Respiratory: CTA bilaterally, no wheezing, no rhonchi Abdominal: Soft, NT, ND, bowel sounds + Extremities: no edema, no cyanosis  The results of significant diagnostics from this hospitalization (including imaging, microbiology, ancillary and laboratory) are listed below for reference.    Microbiology: No results found for this or any previous visit (from the past 240 hour(s)).   Labs: BNP (last 3 results) No results for input(s): BNP in the last 8760 hours. Basic Metabolic Panel:  Recent Labs Lab 11/26/16 2043 11/26/16 2055  NA 138 143  K 3.6 3.7  CL 106 104  CO2 22  --   GLUCOSE 89 88  BUN 11 12  CREATININE 0.80 0.70  CALCIUM 9.0  --    Liver Function Tests:  Recent Labs Lab 11/26/16 2043  AST 119*  ALT 135*  ALKPHOS 99  BILITOT 1.3*  PROT 6.6  ALBUMIN 3.6    No results for input(s): LIPASE, AMYLASE in the last 168 hours.  Recent Labs Lab 11/26/16 2052  AMMONIA 30   CBC:  Recent Labs Lab 11/26/16 2043 11/26/16 2055 11/27/16 0027  WBC 5.4  --  5.8  NEUTROABS 1.3*  --  1.1*  HGB 12.2 12.2 11.7*  HCT 34.7* 36.0 34.3*  MCV 92.3  --  91.0  PLT 55*  --  52*   Cardiac Enzymes:  Recent Labs Lab 11/27/16 0027  TROPONINI <0.03   BNP: Invalid input(s): POCBNP CBG:  Recent Labs Lab 11/26/16 2044  GLUCAP 89   D-Dimer No results for input(s): DDIMER in the last 72 hours. Hgb A1c No results for input(s): HGBA1C in the last 72 hours. Lipid Profile No results for input(s): CHOL, HDL, LDLCALC, TRIG, CHOLHDL, LDLDIRECT in the last 72 hours. Thyroid function studies No results for input(s): TSH, T4TOTAL, T3FREE, THYROIDAB in the last 72 hours.  Invalid input(s): FREET3 Anemia work up No results for input(s): VITAMINB12, FOLATE, FERRITIN, TIBC, IRON, RETICCTPCT in the last 72 hours. Urinalysis No results found for: COLORURINE, APPEARANCEUR, LABSPEC, [place], GLUCOSEU, HGBUR, BILIRUBINUR, KETONESUR, PROTEINUR, UROBILINOGEN, NITRITE, LEUKOCYTESUR Sepsis Labs Invalid input(s): PROCALCITONIN,  WBC,  LACTICIDVEN Microbiology No results found for this or any previous visit (from the past 240 hour(s)).  Time coordinating discharge: 35 minutes  SIGNED:  Kerney Elbe, DO [facility] 11/27/2016, 11:57 AM Pager [phone]  If 7PM-7AM, please contact night-coverage [email or web address] Password [email or web address]

## 2016-11-27 NOTE — ED Notes (Signed)
Patient transported to MRI 

## 2016-11-27 NOTE — Progress Notes (Signed)
Patient still feels weak on subjectively weak on right side, however MRI Negative, MRa shows no significant stenosis.  She has r side throbbing headache associated Nasuea x 2 days. Her symptoms are likely from a complicated migraine and remote migraine history.   Recommend IV toradol 30 mg. Magnesium.  Can continue ASA 81mg   as outpatient. No need for migraine ppx as heasaches rare.  Can be discharged if feeling better.

## 2017-06-11 ENCOUNTER — Emergency Department (HOSPITAL_COMMUNITY)
Admission: EM | Admit: 2017-06-11 | Discharge: 2017-06-11 | Disposition: A | Discharge status: Home / Self Care | Payer: Self-pay | Attending: Emergency Medicine | Admitting: Emergency Medicine

## 2017-06-11 ENCOUNTER — Encounter (HOSPITAL_COMMUNITY): Payer: Self-pay | Admitting: Emergency Medicine

## 2017-06-11 ENCOUNTER — Emergency Department (HOSPITAL_COMMUNITY): Payer: Self-pay

## 2017-06-11 DIAGNOSIS — F17210 Nicotine dependence, cigarettes, uncomplicated: Secondary | ICD-10-CM | POA: Insufficient documentation

## 2017-06-11 DIAGNOSIS — Z7982 Long term (current) use of aspirin: Secondary | ICD-10-CM | POA: Insufficient documentation

## 2017-06-11 DIAGNOSIS — M79671 Pain in right foot: Secondary | ICD-10-CM | POA: Insufficient documentation

## 2017-06-11 MED ORDER — MELOXICAM 15 MG PO TABS: 15.0000 mg | ORAL_TABLET | Freq: Every day | ORAL | 0 refills | Status: DC

## 2017-06-11 NOTE — ED Notes (Signed)
Declined W/C at D/C and was escorted to lobby by RN. 

## 2017-06-11 NOTE — ED Provider Notes (Signed)
[facility] EMERGENCY DEPARTMENT Provider Note   CSN: [id number] Arrival date & time: 06/11/17  0704     History   Chief Complaint Chief Complaint  Patient presents with  . Foot Pain    HPI Valerie [name] is a 52 y.o. female who presents the emergency department chief complaint of right foot pain.  Patient states that she injured her right foot back in December 2018.  It got better however over the past week she has had worsening pain in the foot and now states she has difficulty ambulating.  She claims of pain along the medial arch and in the right 1st MTP joint.  She does not have pain with palpation or movement but has sharp pain every time she tries to bear weight.  HPI  Past Medical History:  Diagnosis Date  . Hepatitis C   . Liver cirrhosis ([place])   . Pseudoseizures     Patient Active Problem List   Diagnosis Date Noted  . Hepatitis C 11/27/2016  . Thrombocytopenia ([place]) 11/27/2016  . Liver cirrhosis ([place]) 11/27/2016  . Elevated liver enzymes 11/27/2016  . Migraine 11/27/2016  . Hemiplegic migraine 11/27/2016  . Marijuana abuse 11/27/2016  . Normocytic anemia 11/27/2016  . Right sided weakness 11/26/2016    Past Surgical History:  Procedure Laterality Date  . CESAREAN SECTION      OB History    No data available       Home Medications    Prior to Admission medications   Medication Sig Start Date End Date Taking? Authorizing Provider  aspirin EC 81 MG tablet Take 1 tablet (81 mg total) by mouth daily. 11/27/16 11/27/17  Kerney Elbe, DO    Family History Family History  Problem Relation Age of Onset  . Heart disease Neg Hx     Social History Social History   Tobacco Use  . Smoking status: Current Every Day Smoker  . Smokeless tobacco: Never Used  Substance Use Topics  . Alcohol use: No  . Drug use: No     Allergies   Norco [hydrocodone-acetaminophen] and Tylenol [acetaminophen]   Review of Systems Review of  Systems  Ten systems reviewed and are negative for acute change, except as noted in the HPI.   Physical Exam Updated Vital Signs BP (!) 154/92 (BP Location: Right Arm)   Pulse 79   Temp 98.1 F (36.7 C) (Oral)   Resp 18   SpO2 100%   Physical Exam  Constitutional: She is oriented to person, place, and time. She appears well-developed and well-nourished. No distress.  HENT:  Head: Normocephalic and atraumatic.  Eyes: Conjunctivae are normal. No scleral icterus.  Neck: Normal range of motion.  Cardiovascular: Normal rate, regular rhythm and normal heart sounds. Exam reveals no gallop and no friction rub.  No murmur heard. Pulmonary/Chest: Effort normal and breath sounds normal. No respiratory distress.  Abdominal: Soft. Bowel sounds are normal. She exhibits no distension and no mass. There is no tenderness. There is no guarding.  Musculoskeletal:  Right foot without any significant deformities, swelling or tenderness to palpation.  Patient has pain with ambulation and weightbearing, antalgic gait.  Normal pulse and sensation.  Neurological: She is alert and oriented to person, place, and time.  Skin: Skin is warm and dry. She is not diaphoretic.  Psychiatric: Her behavior is normal.  Nursing note and vitals reviewed.    ED Treatments / Results  Labs (all labs ordered are listed, but only abnormal results  are displayed) Labs Reviewed - No data to display  EKG  EKG Interpretation None       Radiology Dg Foot Complete Right  Result Date: 06/11/2017 CLINICAL DATA:  Right foot pain for the past 2 months. Patient reports injury in December of last year. EXAM: RIGHT FOOT COMPLETE - 3+ VIEW COMPARISON:  None. FINDINGS: No fracture or dislocation. Joint spaces are preserved. No significant hallux valgus deformity. Diminutive appearance of the tuft of the distal phalanx of the second digit, potentially the sequela of remote trauma. No erosions. No plantar calcaneal spur. Regional  soft tissues appear normal. IMPRESSION: No explanation for patient's right foot pain. Electronically Signed   By: Sandi Mariscal M.D.   On: 06/11/2017 08:15    Procedures Procedures (including critical care time)  Medications Ordered in ED Medications - No data to display   Initial Impression / Assessment and Plan / ED Course  I have reviewed the triage vital signs and the nursing notes.  Pertinent labs & imaging results that were available during my care of the patient were reviewed by me and considered in my medical decision making (see chart for details).     Patient X-Ray negative for obvious fracture or dislocation. Pain managed in ED. Pt advised to follow up with orthopedics if symptoms persist for possibility of missed fracture diagnosis. Patient given brace while in ED, conservative therapy recommended and discussed. Patient will be dc home & is agreeable with above plan.   Final Clinical Impressions(s) / ED Diagnoses   Final diagnoses:  Foot pain, right    ED Discharge Orders    None       Margarita Mail, PA-C 06/11/17 1509    Virgel Manifold, MD 06/11/17 1529

## 2017-06-11 NOTE — ED Triage Notes (Addendum)
Pt to ER for evaluation of right foot pain x2 months, states before christmas, she hit her foot on the edge of a counter. Complaining of right medial foot pain, worse with walking. States after the injury it got better, but then the pain came back. No redness, swelling. ROM intact.

## 2017-06-11 NOTE — Discharge Instructions (Signed)
Get help right away if: Your foot is numb or tingling. Your foot or toes are swollen. Your foot or toes turn white or blue. You have warmth and redness along your foot.

## 2017-09-02 ENCOUNTER — Encounter (HOSPITAL_COMMUNITY): Payer: Self-pay

## 2017-09-02 ENCOUNTER — Emergency Department (HOSPITAL_COMMUNITY): Payer: Self-pay

## 2017-09-02 ENCOUNTER — Other Ambulatory Visit: Payer: Self-pay

## 2017-09-02 ENCOUNTER — Emergency Department (HOSPITAL_COMMUNITY)
Admission: EM | Admit: 2017-09-02 | Discharge: 2017-09-02 | Disposition: A | Discharge status: Home / Self Care | Payer: Self-pay | Attending: Emergency Medicine | Admitting: Emergency Medicine

## 2017-09-02 DIAGNOSIS — F17200 Nicotine dependence, unspecified, uncomplicated: Secondary | ICD-10-CM | POA: Insufficient documentation

## 2017-09-02 DIAGNOSIS — Z7982 Long term (current) use of aspirin: Secondary | ICD-10-CM | POA: Insufficient documentation

## 2017-09-02 DIAGNOSIS — Z79899 Other long term (current) drug therapy: Secondary | ICD-10-CM | POA: Insufficient documentation

## 2017-09-02 DIAGNOSIS — F445 Conversion disorder with seizures or convulsions: Secondary | ICD-10-CM | POA: Insufficient documentation

## 2017-09-02 LAB — COMPREHENSIVE METABOLIC PANEL
ALT: 144 U/L — ABNORMAL HIGH (ref 14–54)
AST: 123 U/L — AB (ref 15–41)
Albumin: 3.6 g/dL (ref 3.5–5.0)
Alkaline Phosphatase: 113 U/L (ref 38–126)
Anion gap: 9 (ref 5–15)
BUN: 9 mg/dL (ref 6–20)
CO2: 24 mmol/L (ref 22–32)
CREATININE: 0.8 mg/dL (ref 0.44–1.00)
Calcium: 9.2 mg/dL (ref 8.9–10.3)
Chloride: 109 mmol/L (ref 101–111)
GFR calc Af Amer: >60 mL/min (ref >60)
Glucose, Bld: 84 mg/dL (ref 65–99)
Potassium: 3.8 mmol/L (ref 3.5–5.1)
Sodium: 142 mmol/L (ref 135–145)
Total Bilirubin: 0.9 mg/dL (ref 0.3–1.2)
Total Protein: 7 g/dL (ref 6.5–8.1)

## 2017-09-02 LAB — CBG MONITORING, ED: Glucose-Capillary: 74 mg/dL (ref 65–99)

## 2017-09-02 LAB — CBC WITH DIFFERENTIAL/PLATELET
Basophils Absolute: 0 10*3/uL (ref 0.0–0.1)
Basophils Relative: 0 %
EOS ABS: 0 10*3/uL (ref 0.0–0.7)
EOS PCT: 1 %
HCT: 36.8 % (ref 36.0–46.0)
Hemoglobin: 12.3 g/dL (ref 12.0–15.0)
LYMPHS ABS: 2 10*3/uL (ref 0.7–4.0)
Lymphocytes Relative: 54 %
MCH: 31.4 pg (ref 26.0–34.0)
MCHC: 33.4 g/dL (ref 30.0–36.0)
MCV: 93.9 fL (ref 78.0–100.0)
Monocytes Absolute: 0.3 10*3/uL (ref 0.1–1.0)
Monocytes Relative: 7 %
Neutro Abs: 1.4 10*3/uL — ABNORMAL LOW (ref 1.7–7.7)
Neutrophils Relative %: 38 %
PLATELETS: 48 10*3/uL — AB (ref 150–400)
RBC: 3.92 MIL/uL (ref 3.87–5.11)
RDW: 13.8 % (ref 11.5–15.5)
WBC: 3.7 10*3/uL — AB (ref 4.0–10.5)

## 2017-09-02 MED ORDER — HYDROXYZINE HCL 25 MG PO TABS
25.0000 mg | ORAL_TABLET | Freq: Once | ORAL | Status: AC
  Administered 2017-09-02: 25 mg via ORAL
  Filled 2017-09-02: qty 1

## 2017-09-02 MED ORDER — HYDROXYZINE HCL 25 MG PO TABS: 25.0000 mg | ORAL_TABLET | Freq: Three times a day (TID) | ORAL | 0 refills | Status: DC | PRN

## 2017-09-02 NOTE — ED Notes (Signed)
Bed: WA02 Expected date: 09/02/17 Expected time: 12:29 PM Means of arrival: Ambulance Comments: seizure

## 2017-09-02 NOTE — ED Triage Notes (Signed)
Pt arrives from home via EMS after having 4 witnessed (by family) seizures today. Each around 1-2 min, normal for pt. Hx of seizures, has not taken depakote in over 2 months. A/ox4, ambulatory.

## 2017-09-02 NOTE — ED Provider Notes (Signed)
[facility] DEPT Provider Note   CSN: [id number] Arrival date & time: 09/02/17  1228     History   Chief Complaint Chief Complaint  Patient presents with  . Seizures    HPI Valerie [name] is a 52 y.o. female.  HPI Patient has history of pseudoseizures.  States she had for the seizures today.  States they come on with stress.  States she has a history of this.  Previously was on Depakote but states she is not on that anymore because she was told was bad for liver and she has a history of cirrhosis.  Smokes marijuana but otherwise denies substance abuse.  States he does have a headache today which she normally gets with the seizures.  States she did however hit her head.  No chest or abdominal pain.  No extremity pain. Past Medical History:  Diagnosis Date  . Hepatitis C   . Liver cirrhosis ([place])   . Pseudoseizures     Patient Active Problem List   Diagnosis Date Noted  . Hepatitis C 11/27/2016  . Thrombocytopenia ([place]) 11/27/2016  . Liver cirrhosis ([place]) 11/27/2016  . Elevated liver enzymes 11/27/2016  . Migraine 11/27/2016  . Hemiplegic migraine 11/27/2016  . Marijuana abuse 11/27/2016  . Normocytic anemia 11/27/2016  . Right sided weakness 11/26/2016    Past Surgical History:  Procedure Laterality Date  . CESAREAN SECTION       OB History   None      Home Medications    Prior to Admission medications   Medication Sig Start Date End Date Taking? Authorizing Provider  aspirin EC 81 MG tablet Take 1 tablet (81 mg total) by mouth daily. Patient not taking: Reported on 09/02/2017 11/27/16 11/27/17  Raiford Noble Latif, DO  hydrOXYzine (ATARAX/VISTARIL) 25 MG tablet Take 1 tablet (25 mg total) by mouth every 8 (eight) hours as needed for anxiety. 09/02/17   Davonna Belling, MD  meloxicam (MOBIC) 15 MG tablet Take 1 tablet (15 mg total) by mouth daily. Patient not taking: Reported on 09/02/2017 06/11/17   Margarita Mail, PA-C    Family  History Family History  Problem Relation Age of Onset  . Heart disease Neg Hx     Social History Social History   Tobacco Use  . Smoking status: Current Every Day Smoker  . Smokeless tobacco: Never Used  Substance Use Topics  . Alcohol use: No  . Drug use: No     Allergies   Norco [hydrocodone-acetaminophen] and Tylenol [acetaminophen]   Review of Systems Review of Systems  Constitutional: Negative for appetite change.  Cardiovascular: Negative for chest pain.  Gastrointestinal: Negative for abdominal pain.  Musculoskeletal: Negative for back pain.  Neurological: Positive for seizures and headaches.  Psychiatric/Behavioral: The patient is nervous/anxious.      Physical Exam Updated Vital Signs BP 135/81 (BP Location: Right Arm)   Pulse 75   Temp 98.1 F (36.7 C) (Oral)   Resp (!) 22   Ht 5\' 6"  (1.676 m)   Wt 61.2 kg (135 lb)   SpO2 100%   BMI 21.79 kg/m   Physical Exam  Constitutional: She appears well-developed.  HENT:  Occipital tenderness  Eyes: Pupils are equal, round, and reactive to light.  Neck: Neck supple.  Cardiovascular: Normal rate.  Pulmonary/Chest: Effort normal.  Abdominal: Soft.  Musculoskeletal: She exhibits no tenderness.  Neurological: She is alert.  Skin: Skin is warm. Capillary refill takes less than 2 seconds.  Psychiatric: She has a normal  mood and affect.     ED Treatments / Results  Labs (all labs ordered are listed, but only abnormal results are displayed) Labs Reviewed  COMPREHENSIVE METABOLIC PANEL - Abnormal; Notable for the following components:      Result Value   AST 123 (*)    ALT 144 (*)    All other components within normal limits  CBC WITH DIFFERENTIAL/PLATELET - Abnormal; Notable for the following components:   WBC 3.7 (*)    Platelets 48 (*)    Neutro Abs 1.4 (*)    All other components within normal limits  CBG MONITORING, ED    EKG None  Radiology Ct Head Wo Contrast  Result Date:  09/02/2017 CLINICAL DATA:  Frontal headache after having a seizure and falling and hitting her head today. EXAM: CT HEAD WITHOUT CONTRAST TECHNIQUE: Contiguous axial images were obtained from the base of the skull through the vertex without intravenous contrast. COMPARISON:  Head CT dated 11/26/2016 and brain MR dated 11/27/2016. FINDINGS: Brain: Normal appearing cerebral hemispheres and posterior fossa structures. Normal size and position of the ventricles. No intracranial hemorrhage, mass lesion or CT evidence of acute infarction. Vascular: No hyperdense vessel or unexpected calcification. Skull: Normal. Negative for fracture or focal lesion. Sinuses/Orbits: Unremarkable. Other: None. IMPRESSION: Normal examination. Electronically Signed   By: Claudie Revering M.D.   On: 09/02/2017 14:16    Procedures Procedures (including critical care time)  Medications Ordered in ED Medications  hydrOXYzine (ATARAX/VISTARIL) tablet 25 mg (25 mg Oral Given 09/02/17 1456)     Initial Impression / Assessment and Plan / ED Course  I have reviewed the triage vital signs and the nursing notes.  Pertinent labs & imaging results that were available during my care of the patient were reviewed by me and considered in my medical decision making (see chart for details).     Patient with likely pseudoseizure.  Had been on Depakote but no longer on it.  Given Atarax.  Head CT done and reassuring.  Continues to be at baseline.  Will discharge home and given resources for follow-up.  Final Clinical Impressions(s) / ED Diagnoses   Final diagnoses:  Psychogenic nonepileptic seizure    ED Discharge Orders        Ordered    hydrOXYzine (ATARAX/VISTARIL) 25 MG tablet  Every 8 hours PRN     09/02/17 1605       Davonna Belling, MD 09/02/17 1609

## 2018-08-02 ENCOUNTER — Ambulatory Visit: Payer: Self-pay | Admitting: Family Medicine

## 2019-06-09 ENCOUNTER — Emergency Department (HOSPITAL_COMMUNITY)
Admission: EM | Admit: 2019-06-09 | Discharge: 2019-06-09 | Disposition: A | Discharge status: Home / Self Care | Payer: Medicaid Other | Attending: Emergency Medicine | Admitting: Emergency Medicine

## 2019-06-09 ENCOUNTER — Encounter (HOSPITAL_COMMUNITY): Payer: Self-pay | Admitting: Emergency Medicine

## 2019-06-09 ENCOUNTER — Other Ambulatory Visit: Payer: Self-pay

## 2019-06-09 DIAGNOSIS — Y999 Unspecified external cause status: Secondary | ICD-10-CM | POA: Insufficient documentation

## 2019-06-09 DIAGNOSIS — M546 Pain in thoracic spine: Secondary | ICD-10-CM | POA: Insufficient documentation

## 2019-06-09 DIAGNOSIS — Y939 Activity, unspecified: Secondary | ICD-10-CM | POA: Insufficient documentation

## 2019-06-09 DIAGNOSIS — F17210 Nicotine dependence, cigarettes, uncomplicated: Secondary | ICD-10-CM | POA: Insufficient documentation

## 2019-06-09 DIAGNOSIS — Y929 Unspecified place or not applicable: Secondary | ICD-10-CM | POA: Insufficient documentation

## 2019-06-09 DIAGNOSIS — X58XXXA Exposure to other specified factors, initial encounter: Secondary | ICD-10-CM | POA: Insufficient documentation

## 2019-06-09 DIAGNOSIS — M25511 Pain in right shoulder: Secondary | ICD-10-CM | POA: Insufficient documentation

## 2019-06-09 DIAGNOSIS — S161XXA Strain of muscle, fascia and tendon at neck level, initial encounter: Secondary | ICD-10-CM | POA: Insufficient documentation

## 2019-06-09 DIAGNOSIS — R0789 Other chest pain: Secondary | ICD-10-CM | POA: Insufficient documentation

## 2019-06-09 MED ORDER — METHOCARBAMOL 500 MG PO TABS
500.0000 mg | ORAL_TABLET | Freq: Once | ORAL | Status: AC
  Administered 2019-06-09: 08:00:00 500 mg via ORAL
  Filled 2019-06-09: qty 1

## 2019-06-09 MED ORDER — KETOROLAC TROMETHAMINE 30 MG/ML IJ SOLN
15.0000 mg | Freq: Once | INTRAMUSCULAR | Status: AC
  Administered 2019-06-09: 08:00:00 15 mg via INTRAMUSCULAR
  Filled 2019-06-09: qty 1

## 2019-06-09 MED ORDER — OXYCODONE HCL 5 MG PO TABS
5.0000 mg | ORAL_TABLET | Freq: Once | ORAL | Status: AC
  Administered 2019-06-09: 08:00:00 5 mg via ORAL
  Filled 2019-06-09: qty 1

## 2019-06-09 MED ORDER — DICLOFENAC SODIUM 1 % EX GEL: 2.0000 g | Freq: Four times a day (QID) | CUTANEOUS | 0 refills | Status: DC

## 2019-06-09 MED ORDER — METHOCARBAMOL 500 MG PO TABS: 500.0000 mg | ORAL_TABLET | Freq: Two times a day (BID) | ORAL | 0 refills | Status: DC

## 2019-06-09 MED ORDER — METHYLPREDNISOLONE 4 MG PO TBPK: ORAL_TABLET | ORAL | 0 refills | Status: DC

## 2019-06-09 NOTE — Discharge Instructions (Addendum)
Take the medications as prescribed.  Take the entire course of steroids regardless of symptom improvement. Follow-up with your primary care provider or the primary care provider listed below for further evaluation and management of your symptoms. Return to the ED if you start to develop worsening chest pain, shortness of breath, neck stiffness with fever, injuries or falls, numbness in arms or legs.

## 2019-06-09 NOTE — ED Triage Notes (Signed)
Patient reports right lateral neck pain radiating to right shoulder and right upper back onset this week , denies injury , pain increases with movement / changing positions .

## 2019-06-09 NOTE — ED Provider Notes (Signed)
[facility] EMERGENCY DEPARTMENT Provider Note   CSN: [id number] Arrival date & time: 06/09/19  4098     History Chief Complaint  Patient presents with  . Neck Pain    Valerie [name] is a 54 y.o. female with a past medical history of hep C, cirrhosis, presenting to the ED with a chief complaint of right-sided neck pain.  Describes the pain as aching, worse with movement and changing positions.  Pain will intermittently radiate to her right shoulder, right upper back and right upper chest.  She has been taking over-the-counter painkillers and muscle rubs with some improvement in her symptoms.  States that she had trouble lifting and moving things at work secondary to the pain.  She denies any shortness of breath, cough but sometimes pain will radiate throughout her entire lower back.  Denies any injuries, falls, numbness in arms or legs, vomiting, neck stiffness or fever, headache, vision changes, leg swelling.  She believes that the pain could be due to muscle pain rather than viral cause such as influenza or Covid.  HPI     Past Medical History:  Diagnosis Date  . Hepatitis C   . Liver cirrhosis ([place])   . Pseudoseizures     Patient Active Problem List   Diagnosis Date Noted  . Hepatitis C 11/27/2016  . Thrombocytopenia ([place]) 11/27/2016  . Liver cirrhosis ([place]) 11/27/2016  . Elevated liver enzymes 11/27/2016  . Migraine 11/27/2016  . Hemiplegic migraine 11/27/2016  . Marijuana abuse 11/27/2016  . Normocytic anemia 11/27/2016  . Right sided weakness 11/26/2016    Past Surgical History:  Procedure Laterality Date  . CESAREAN SECTION       OB History   No obstetric history on file.     Family History  Problem Relation Age of Onset  . Heart disease Neg Hx     Social History   Tobacco Use  . Smoking status: Current Every Day Smoker  . Smokeless tobacco: Never Used  Substance Use Topics  . Alcohol use: No  . Drug use: No    Home  Medications Prior to Admission medications   Medication Sig Start Date End Date Taking? Authorizing Provider  diclofenac Sodium (VOLTAREN) 1 % GEL Apply 2 g topically 4 (four) times daily. 06/09/19   Chyrel Taha, PA-C  hydrOXYzine (ATARAX/VISTARIL) 25 MG tablet Take 1 tablet (25 mg total) by mouth every 8 (eight) hours as needed for anxiety. 09/02/17   Davonna Belling, MD  meloxicam (MOBIC) 15 MG tablet Take 1 tablet (15 mg total) by mouth daily. Patient not taking: Reported on 09/02/2017 06/11/17   Margarita Mail, PA-C  methocarbamol (ROBAXIN) 500 MG tablet Take 1 tablet (500 mg total) by mouth 2 (two) times daily. 06/09/19   Nikeshia Keetch, PA-C  methylPREDNISolone (MEDROL DOSEPAK) 4 MG TBPK tablet Taper over 6 days. 06/09/19   Delia Heady, PA-C    Allergies    Norco [hydrocodone-acetaminophen] and Tylenol [acetaminophen]  Review of Systems   Review of Systems  Constitutional: Negative for appetite change, chills and fever.  HENT: Negative for ear pain, rhinorrhea, sneezing and sore throat.   Eyes: Negative for photophobia and visual disturbance.  Respiratory: Negative for cough, chest tightness, shortness of breath and wheezing.   Cardiovascular: Negative for chest pain and palpitations.  Gastrointestinal: Negative for abdominal pain, blood in stool, constipation, diarrhea, nausea and vomiting.  Genitourinary: Negative for dysuria, hematuria and urgency.  Musculoskeletal: Positive for back pain and myalgias.  Skin: Negative for rash.  Neurological: Negative for dizziness, weakness and light-headedness.    Physical Exam Updated Vital Signs BP (!) 142/93 (BP Location: Right Arm)   Pulse 98   Temp 99.1 F (37.3 C) (Oral)   Resp 16   Ht 5\' 2"  (1.575 m)   Wt 60 kg   SpO2 99%   BMI 24.19 kg/m   Physical Exam Vitals and nursing note reviewed.  Constitutional:      General: She is not in acute distress.    Appearance: She is well-developed.  HENT:     Head: Normocephalic and  atraumatic.     Nose: Nose normal.  Eyes:     General: No scleral icterus.       Left eye: No discharge.     Conjunctiva/sclera: Conjunctivae normal.  Neck:      Comments: No midline spinal tenderness present in lumbar, thoracic or cervical spine. No step-off palpated. No visible bruising, edema or temperature change noted. No objective signs of numbness present. No saddle anesthesia. 2+ DP pulses bilaterally. Sensation intact to light touch. Strength 5/5 in bilateral lower extremities. Cardiovascular:     Rate and Rhythm: Normal rate and regular rhythm.     Heart sounds: Normal heart sounds. No murmur. No friction rub. No gallop.   Pulmonary:     Effort: Pulmonary effort is normal. No respiratory distress.     Breath sounds: Normal breath sounds.  Abdominal:     General: Bowel sounds are normal. There is no distension.     Palpations: Abdomen is soft.     Tenderness: There is no abdominal tenderness. There is no guarding.  Musculoskeletal:        General: Normal range of motion.     Cervical back: Normal range of motion and neck supple. Muscular tenderness present.     Comments: Full range of motion of neck, bilateral shoulders without difficulty.  2+ radial pulse palpated bilaterally.  Equal grip strength bilaterally.  Strength 5/5 in bilateral lower extremities.  Skin:    General: Skin is warm and dry.     Findings: No rash.  Neurological:     Mental Status: She is alert.     Motor: No abnormal muscle tone.     Coordination: Coordination normal.     ED Results / Procedures / Treatments   Labs (all labs ordered are listed, but only abnormal results are displayed) Labs Reviewed - No data to display  EKG EKG Interpretation  Date/Time:  Sunday June 09 2019 07:45:26 EST Ventricular Rate:  87 PR Interval:    QRS Duration: 89 QT Interval:  361 QTC Calculation: 435 R Axis:   82 Text Interpretation: Sinus rhythm No significant change since last tracing Confirmed by  Dorie Rank [phone]) on 06/09/2019 8:08:15 AM   Radiology No results found.  Procedures Procedures (including critical care time)  Medications Ordered in ED Medications  ketorolac (TORADOL) 30 MG/ML injection 15 mg (15 mg Intramuscular Given 06/09/19 0751)  methocarbamol (ROBAXIN) tablet 500 mg (500 mg Oral Given 06/09/19 0750)  oxyCODONE (Oxy IR/ROXICODONE) immediate release tablet 5 mg (5 mg Oral Given 06/09/19 0750)    ED Course  I have reviewed the triage vital signs and the nursing notes.  Pertinent labs & imaging results that were available during my care of the patient were reviewed by me and considered in my medical decision making (see chart for details).  Clinical Course as of Jun 08 834  Sun Jun 09, 2019  0835 Resting comfortably, speaking on  phone during reassessment.   [HK]    Clinical Course User Index [HK] Delia Heady, PA-C   MDM Rules/Calculators/A&P                      54 year old female presents to the ED with a chief complaint of right-sided neck pain.  Pain is aching, worse with movement and will radiate to right shoulder and arm, right upper back and right upper chest.  Denies any injuries or falls and symptoms have been going on for the past 3 to 4 days.  Denies any shortness of breath, cough.  Pain is worse with lifting at work.  On exam there is some tenderness palpation of the right lateral neck without midline tenderness.  Upper extremities and lower extremities are neurovascularly intact.  Normal range of motion noted of the neck and shoulders.  Lungs are clear to auscultation bilaterally.  No leg swelling, calf tenderness of concern me for DVT.  No recent immobilization.  Due to chest pain, EKG was obtained which showed normal sinus rhythm, no changes from prior tracings.  Vital signs are within normal limits.  Patient was given anti-inflammatories, Robaxin and oxycodone with improvement in her symptoms.  She is concerned that it could be due to muscle pain  and spasms. I doubt infectious cause such as meningitis or vascular cause such as dissection or other emergent cause based on her reassuring exam and vital signs today.  We will have her continue medications at home and return for worsening symptoms.  Patient is hemodynamically stable, in NAD, and able to ambulate in the ED. Evaluation does not show pathology that would require ongoing emergent intervention or inpatient treatment. I have personally reviewed and interpreted all lab work and imaging at today's ED visit. I explained the diagnosis to the patient. Pain has been managed and has no complaints prior to discharge. Patient is comfortable with above plan and is stable for discharge at this time. All questions were answered prior to disposition. Strict return precautions for returning to the ED were discussed. Encouraged follow up with PCP.   An After Visit Summary was printed and given to the patient.   Portions of this note were generated with [employer]. Dictation errors may occur despite best attempts at proofreading.  Final Clinical Impression(s) / ED Diagnoses Final diagnoses:  Acute strain of neck muscle, initial encounter    Rx / DC Orders ED Discharge Orders         Ordered    methylPREDNISolone (MEDROL DOSEPAK) 4 MG TBPK tablet  Status:  Discontinued     06/09/19 0833    methocarbamol (ROBAXIN) 500 MG tablet  2 times daily     06/09/19 0833    diclofenac Sodium (VOLTAREN) 1 % GEL  4 times daily     06/09/19 0833    methylPREDNISolone (MEDROL DOSEPAK) 4 MG TBPK tablet     06/09/19 0834           Delia Heady, PA-C 06/09/19 8280    Dorie Rank, MD 06/10/19 1016

## 2019-06-21 ENCOUNTER — Emergency Department (HOSPITAL_COMMUNITY)
Admission: EM | Admit: 2019-06-21 | Discharge: 2019-06-22 | Disposition: A | Discharge status: Home / Self Care | Payer: Medicaid Other | Attending: Emergency Medicine | Admitting: Emergency Medicine

## 2019-06-21 ENCOUNTER — Emergency Department (HOSPITAL_COMMUNITY): Payer: Medicaid Other

## 2019-06-21 ENCOUNTER — Encounter (HOSPITAL_COMMUNITY): Payer: Self-pay | Admitting: Emergency Medicine

## 2019-06-21 ENCOUNTER — Other Ambulatory Visit: Payer: Self-pay

## 2019-06-21 DIAGNOSIS — B182 Chronic viral hepatitis C: Secondary | ICD-10-CM | POA: Insufficient documentation

## 2019-06-21 DIAGNOSIS — Z20822 Contact with and (suspected) exposure to covid-19: Secondary | ICD-10-CM | POA: Diagnosis not present

## 2019-06-21 DIAGNOSIS — R112 Nausea with vomiting, unspecified: Secondary | ICD-10-CM | POA: Insufficient documentation

## 2019-06-21 DIAGNOSIS — R197 Diarrhea, unspecified: Secondary | ICD-10-CM | POA: Diagnosis not present

## 2019-06-21 DIAGNOSIS — F17210 Nicotine dependence, cigarettes, uncomplicated: Secondary | ICD-10-CM | POA: Diagnosis not present

## 2019-06-21 DIAGNOSIS — M7918 Myalgia, other site: Secondary | ICD-10-CM | POA: Insufficient documentation

## 2019-06-21 DIAGNOSIS — R1110 Vomiting, unspecified: Secondary | ICD-10-CM

## 2019-06-21 DIAGNOSIS — R0789 Other chest pain: Secondary | ICD-10-CM | POA: Insufficient documentation

## 2019-06-21 LAB — TROPONIN I (HIGH SENSITIVITY): Troponin I (High Sensitivity): 6 ng/L (ref <18)

## 2019-06-21 LAB — BASIC METABOLIC PANEL
Anion gap: 8 (ref 5–15)
BUN: 11 mg/dL (ref 6–20)
CO2: 25 mmol/L (ref 22–32)
Calcium: 8.7 mg/dL — ABNORMAL LOW (ref 8.9–10.3)
Chloride: 102 mmol/L (ref 98–111)
Creatinine, Ser: 0.82 mg/dL (ref 0.44–1.00)
GFR calc Af Amer: >60 mL/min (ref >60)
GFR calc non Af Amer: >60 mL/min (ref >60)
Glucose, Bld: 134 mg/dL — ABNORMAL HIGH (ref 70–99)
Potassium: 4.1 mmol/L (ref 3.5–5.1)
Sodium: 135 mmol/L (ref 135–145)

## 2019-06-21 LAB — CBC
HCT: 36.5 % (ref 36.0–46.0)
Hemoglobin: 12 g/dL (ref 12.0–15.0)
MCH: 31.5 pg (ref 26.0–34.0)
MCHC: 32.9 g/dL (ref 30.0–36.0)
MCV: 95.8 fL (ref 80.0–100.0)
Platelets: UNDETERMINED 10*3/uL (ref 150–400)
RBC: 3.81 MIL/uL — ABNORMAL LOW (ref 3.87–5.11)
RDW: 14.7 % (ref 11.5–15.5)
WBC: 6.8 10*3/uL (ref 4.0–10.5)
nRBC: 0 % (ref 0.0–0.2)

## 2019-06-21 LAB — I-STAT BETA HCG BLOOD, ED (MC, WL, AP ONLY): I-stat hCG, quantitative: <5 m[IU]/mL (ref <5)

## 2019-06-21 LAB — PROTIME-INR
INR: 1 (ref 0.8–1.2)
Prothrombin Time: 13.5 seconds (ref 11.4–15.2)

## 2019-06-21 MED ORDER — SODIUM CHLORIDE 0.9% FLUSH: 3.0000 mL | Freq: Once | INTRAVENOUS | Status: DC

## 2019-06-21 NOTE — ED Triage Notes (Signed)
Patient arrived with EMS from home reports persistent central chest pain and generalized abdominal pain for 1 week with emesis and diarrhea . Occasional dry cough and exertional dyspnea.

## 2019-06-22 LAB — SARS CORONAVIRUS 2 (TAT 6-24 HRS): SARS Coronavirus 2: NEGATIVE

## 2019-06-22 MED ORDER — METHOCARBAMOL 500 MG PO TABS
750.0000 mg | ORAL_TABLET | Freq: Once | ORAL | Status: AC
  Administered 2019-06-22: 750 mg via ORAL
  Filled 2019-06-22: qty 2

## 2019-06-22 MED ORDER — IBUPROFEN 600 MG PO TABS: 600.0000 mg | ORAL_TABLET | Freq: Four times a day (QID) | ORAL | 0 refills | Status: DC | PRN

## 2019-06-22 MED ORDER — DIPHENOXYLATE-ATROPINE 2.5-0.025 MG PO TABS: 2.0000 | ORAL_TABLET | Freq: Four times a day (QID) | ORAL | 0 refills | Status: DC | PRN

## 2019-06-22 MED ORDER — METHOCARBAMOL 500 MG PO TABS: 500.0000 mg | ORAL_TABLET | Freq: Two times a day (BID) | ORAL | 0 refills | Status: DC

## 2019-06-22 MED ORDER — IBUPROFEN 400 MG PO TABS
600.0000 mg | ORAL_TABLET | Freq: Once | ORAL | Status: AC
  Administered 2019-06-22: 600 mg via ORAL
  Filled 2019-06-22: qty 1

## 2019-06-22 MED ORDER — ONDANSETRON 4 MG PO TBDP: 4.0000 mg | ORAL_TABLET | Freq: Three times a day (TID) | ORAL | 0 refills | Status: DC | PRN

## 2019-06-22 NOTE — ED Notes (Signed)
Jonell Cluck, daughter, [phone] would like to speak to a nurse when possible about her mother's condition

## 2019-06-22 NOTE — ED Provider Notes (Signed)
[facility] EMERGENCY DEPARTMENT Provider Note   CSN: [id number] Arrival date & time: 06/21/19  2126     History Chief Complaint  Patient presents with  . Chest Pain  . Abdominal Pain    Valerie [name] is a 54 y.o. female.  Patient to ED with pain in the right side of her neck that radiates to the right shoulder and right chest wall. Symptoms started 2 weeks ago. She was seen and evaluated in the ED for same and treated with Robaxin, Voltaren Gel and a steroid dose pack. She states she got some relief but her symptoms are now heightened again. 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 She is taking over-the-counter medications at this point but reports little relief. The pain is exacerbated by movement, improved by lying on her right side. No headache, SoB, cough, congestion, fever. She reports onset nausea with infrequent vomiting and >10 daily episodes diarrhea that started 3 days ago. She feels she has had a fever for the same 3 days. No urinary symptoms. She reports a positive COVID exposure at work over the past week.  The history is provided by the patient. No language interpreter was used.  Chest Pain Associated symptoms: nausea and vomiting   Associated symptoms: no abdominal pain, no cough, no fever and no shortness of breath   Abdominal Pain Associated symptoms: chest pain, diarrhea, nausea and vomiting   Associated symptoms: no chills, no cough, no fever and no shortness of breath        Past Medical History:  Diagnosis Date  . Hepatitis C   . Liver cirrhosis ([place])   . Pseudoseizures     Patient Active Problem List   Diagnosis Date Noted  . Hepatitis C 11/27/2016  . Thrombocytopenia ([place]) 11/27/2016  . Liver cirrhosis ([place]) 11/27/2016  . Elevated liver enzymes 11/27/2016  . Migraine 11/27/2016  . Hemiplegic migraine 11/27/2016  . Marijuana abuse 11/27/2016  . Normocytic anemia 11/27/2016  . Right sided weakness 11/26/2016     Past Surgical History:  Procedure Laterality Date  . CESAREAN SECTION       OB History   No obstetric history on file.     Family History  Problem Relation Age of Onset  . Heart disease Neg Hx     Social History   Tobacco Use  . Smoking status: Current Every Day Smoker  . Smokeless tobacco: Never Used  Substance Use Topics  . Alcohol use: No  . Drug use: No    Home Medications Prior to Admission medications   Medication Sig Start Date End Date Taking? Authorizing Provider  diclofenac Sodium (VOLTAREN) 1 % GEL Apply 2 g topically 4 (four) times daily. Patient not taking: Reported on 06/22/2019 06/09/19   Delia Heady, PA-C  hydrOXYzine (ATARAX/VISTARIL) 25 MG tablet Take 1 tablet (25 mg total) by mouth every 8 (eight) hours as needed for anxiety. Patient not taking: Reported on 06/22/2019 09/02/17   Davonna Belling, MD  methocarbamol (ROBAXIN) 500 MG tablet Take 1 tablet (500 mg total) by mouth 2 (two) times daily. Patient not taking: Reported on 06/22/2019 06/09/19   Delia Heady, PA-C  methylPREDNISolone (MEDROL DOSEPAK) 4 MG TBPK tablet Taper over 6 days. Patient not taking: Reported on 06/22/2019 06/09/19   Delia Heady, PA-C    Allergies    Norco [hydrocodone-acetaminophen] and Tylenol [acetaminophen]  Review of Systems   Review of Systems  Constitutional: Positive for appetite change. Negative for chills and fever.  HENT: Negative.   Respiratory: Negative.  Negative for cough and shortness of breath.   Cardiovascular: Positive for chest pain.  Gastrointestinal: Positive for diarrhea, nausea and vomiting. Negative for abdominal pain.  Genitourinary: Negative.   Musculoskeletal:       See HPI.  Skin: Negative.   Neurological: Negative.     Physical Exam Updated Vital Signs BP (!) 157/94   Pulse 96   Temp 100 F (37.8 C) (Oral)   Resp 16   Ht 5\' 2"  (1.575 m)   Wt 60 kg   SpO2 100%   BMI 24.19 kg/m   Physical Exam Vitals and nursing note reviewed.   Constitutional:      General: She is not in acute distress.    Appearance: She is well-developed.  HENT:     Head: Normocephalic and atraumatic.  Cardiovascular:     Rate and Rhythm: Normal rate and regular rhythm.     Heart sounds: No systolic murmur.  Pulmonary:     Effort: Pulmonary effort is normal.     Breath sounds: Normal breath sounds. No wheezing, rhonchi or rales.  Chest:     Chest wall: Tenderness (right anterior chest wall) present.  Abdominal:     General: Bowel sounds are normal.     Palpations: Abdomen is soft.     Tenderness: There is no abdominal tenderness. There is no guarding or rebound.  Musculoskeletal:        General: Normal range of motion.     Cervical back: Normal range of motion and neck supple.     Comments: Tenderness to right lateral neck without induration or mass. Movement of the right UE exacerbates neck and chest pain. No swelling. No midline cervical tenderness.   Skin:    General: Skin is warm and dry.     Findings: No rash.  Neurological:     Mental Status: She is alert and oriented to person, place, and time.     ED Results / Procedures / Treatments   Labs (all labs ordered are listed, but only abnormal results are displayed) Labs Reviewed  BASIC METABOLIC PANEL - Abnormal; Notable for the following components:      Result Value   Glucose, Bld 134 (*)    Calcium 8.7 (*)    All other components within normal limits  CBC - Abnormal; Notable for the following components:   RBC 3.81 (*)    All other components within normal limits  SARS CORONAVIRUS 2 (TAT 6-24 HRS)  PROTIME-INR  I-STAT BETA HCG BLOOD, ED ([facility], [facility], [facility] ONLY)  TROPONIN I (HIGH SENSITIVITY)  TROPONIN I (HIGH SENSITIVITY)   Results for orders placed or performed during the hospital encounter of 78/93/81  Basic metabolic panel  Result Value Ref Range   Sodium 135 135 - 145 mmol/L   Potassium 4.1 3.5 - 5.1 mmol/L   Chloride 102 98 - 111 mmol/L   CO2 25 22 - 32 mmol/L    Glucose, Bld 134 (H) 70 - 99 mg/dL   BUN 11 6 - 20 mg/dL   Creatinine, Ser 0.82 0.44 - 1.00 mg/dL   Calcium 8.7 (L) 8.9 - 10.3 mg/dL   GFR calc non Af Amer >60 >60 mL/min   GFR calc Af Amer >60 >60 mL/min   Anion gap 8 5 - 15  CBC  Result Value Ref Range   WBC 6.8 4.0 - 10.5 K/uL   RBC 3.81 (L) 3.87 - 5.11 MIL/uL   Hemoglobin 12.0 12.0 -  15.0 g/dL   HCT 36.5 36.0 - 46.0 %   MCV 95.8 80.0 - 100.0 fL   MCH 31.5 26.0 - 34.0 pg   MCHC 32.9 30.0 - 36.0 g/dL   RDW 14.7 11.5 - 15.5 %   Platelets PLATELET CLUMPS NOTED ON SMEAR, UNABLE TO ESTIMATE 150 - 400 K/uL   nRBC 0.0 0.0 - 0.2 %  Protime-INR (order if Patient is taking Coumadin / Warfarin)  Result Value Ref Range   Prothrombin Time 13.5 11.4 - 15.2 seconds   INR 1.0 0.8 - 1.2  I-Stat beta hCG blood, ED  Result Value Ref Range   I-stat hCG, quantitative <5.0 <5 mIU/mL   Comment 3          Troponin I (High Sensitivity)  Result Value Ref Range   Troponin I (High Sensitivity) 6 <18 ng/L    EKG None  Radiology DG Chest 2 View  Result Date: 06/21/2019 CLINICAL DATA:  Chest pain EXAM: CHEST - 2 VIEW COMPARISON:  None. FINDINGS: Cardiac shadow is within normal limits. Mild right basilar atelectasis is noted. No effusion is noted. No bony abnormality is seen. Aortic calcifications are noted. IMPRESSION: Mild right basilar atelectasis. Electronically Signed   By: Inez Catalina M.D.   On: 06/21/2019 22:05    Procedures Procedures (including critical care time)  Medications Ordered in ED Medications  sodium chloride flush (NS) 0.9 % injection 3 mL (3 mLs Intravenous Not Given 06/22/19 0206)  methocarbamol (ROBAXIN) tablet 750 mg (has no administration in time range)  ibuprofen (ADVIL) tablet 600 mg (has no administration in time range)    ED Course  I have reviewed the triage vital signs and the nursing notes.  Pertinent labs & imaging results that were available during my care of the patient were reviewed by me and considered in  my medical decision making (see chart for details).    MDM Rules/Calculators/A&P                      Patient to ED with ongoing, progressive right sided neck and chest pain, identical to ED presentation on 2/28. Pain is reproducible to palpation. She reports muscle relaxers and anti-inflammatories helped symptoms improve. Likely ongoing musculoskeletal pain.   She has had new onset nausea, vomiting, diarrhea over the last 3 days. New COVID exposure. Denies SOB, CP, cough. COVID swab collected for send out. She has tried Imodium with limited benefit. Will provide Rx Zofran and Lomotil. No vomiting witnessed in the ED. No multiple trips to the bathroom for bowel movements. She does not appear dehydrated.   She was referred to [facility] but failed to make an appointment for follow up. This was discussed. She is strongly encouraged to call for an appointment.  Final Clinical Impression(s) / ED Diagnoses Final diagnoses:  None   1. Muscular neck pain 2. Chest wall pain 3. Vomiting and diarrhea.   Rx / DC Orders ED Discharge Orders    None       Charlann Lange, PA-C 06/22/19 7989    Ward, Delice Bison, DO 06/22/19 0345

## 2019-06-25 ENCOUNTER — Telehealth: Payer: Self-pay

## 2019-06-25 NOTE — Telephone Encounter (Signed)
Pt notified of negative COVID-19 results. Understanding verbalized.  [name]   

## 2019-06-28 ENCOUNTER — Encounter (HOSPITAL_COMMUNITY): Payer: Self-pay | Admitting: *Deleted

## 2019-06-28 ENCOUNTER — Other Ambulatory Visit: Payer: Self-pay

## 2019-06-28 ENCOUNTER — Ambulatory Visit (HOSPITAL_COMMUNITY)
Admission: EM | Admit: 2019-06-28 | Discharge: 2019-06-28 | Payer: Medicaid Other | Attending: Family Medicine | Admitting: Family Medicine

## 2019-06-28 ENCOUNTER — Emergency Department (HOSPITAL_COMMUNITY): Payer: Medicaid Other

## 2019-06-28 ENCOUNTER — Inpatient Hospital Stay (HOSPITAL_COMMUNITY)
Admission: EM | Admit: 2019-06-28 | Discharge: 2019-07-04 | DRG: 435 | Disposition: A | Discharge status: Home / Self Care | Payer: Medicaid Other | Attending: Internal Medicine | Admitting: Internal Medicine

## 2019-06-28 ENCOUNTER — Encounter (HOSPITAL_COMMUNITY): Payer: Self-pay

## 2019-06-28 DIAGNOSIS — I700 Atherosclerosis of aorta: Secondary | ICD-10-CM | POA: Diagnosis present

## 2019-06-28 DIAGNOSIS — R160 Hepatomegaly, not elsewhere classified: Secondary | ICD-10-CM

## 2019-06-28 DIAGNOSIS — D638 Anemia in other chronic diseases classified elsewhere: Secondary | ICD-10-CM | POA: Diagnosis present

## 2019-06-28 DIAGNOSIS — F319 Bipolar disorder, unspecified: Secondary | ICD-10-CM | POA: Diagnosis present

## 2019-06-28 DIAGNOSIS — K819 Cholecystitis, unspecified: Secondary | ICD-10-CM | POA: Diagnosis present

## 2019-06-28 DIAGNOSIS — H538 Other visual disturbances: Secondary | ICD-10-CM | POA: Diagnosis present

## 2019-06-28 DIAGNOSIS — F17210 Nicotine dependence, cigarettes, uncomplicated: Secondary | ICD-10-CM | POA: Diagnosis present

## 2019-06-28 DIAGNOSIS — D696 Thrombocytopenia, unspecified: Secondary | ICD-10-CM | POA: Diagnosis present

## 2019-06-28 DIAGNOSIS — R109 Unspecified abdominal pain: Secondary | ICD-10-CM | POA: Diagnosis present

## 2019-06-28 DIAGNOSIS — C787 Secondary malignant neoplasm of liver and intrahepatic bile duct: Principal | ICD-10-CM | POA: Diagnosis present

## 2019-06-28 DIAGNOSIS — R400 Somnolence: Secondary | ICD-10-CM | POA: Diagnosis not present

## 2019-06-28 DIAGNOSIS — Z923 Personal history of irradiation: Secondary | ICD-10-CM

## 2019-06-28 DIAGNOSIS — E440 Moderate protein-calorie malnutrition: Secondary | ICD-10-CM | POA: Diagnosis present

## 2019-06-28 DIAGNOSIS — E876 Hypokalemia: Secondary | ICD-10-CM | POA: Diagnosis present

## 2019-06-28 DIAGNOSIS — Z20822 Contact with and (suspected) exposure to covid-19: Secondary | ICD-10-CM | POA: Diagnosis present

## 2019-06-28 DIAGNOSIS — R748 Abnormal levels of other serum enzymes: Secondary | ICD-10-CM | POA: Diagnosis present

## 2019-06-28 DIAGNOSIS — R519 Headache, unspecified: Secondary | ICD-10-CM | POA: Diagnosis present

## 2019-06-28 DIAGNOSIS — I8510 Secondary esophageal varices without bleeding: Secondary | ICD-10-CM | POA: Diagnosis present

## 2019-06-28 DIAGNOSIS — Z515 Encounter for palliative care: Secondary | ICD-10-CM | POA: Diagnosis not present

## 2019-06-28 DIAGNOSIS — Z66 Do not resuscitate: Secondary | ICD-10-CM | POA: Diagnosis present

## 2019-06-28 DIAGNOSIS — K7460 Unspecified cirrhosis of liver: Secondary | ICD-10-CM | POA: Diagnosis present

## 2019-06-28 DIAGNOSIS — F419 Anxiety disorder, unspecified: Secondary | ICD-10-CM | POA: Diagnosis present

## 2019-06-28 DIAGNOSIS — M542 Cervicalgia: Secondary | ICD-10-CM

## 2019-06-28 DIAGNOSIS — D62 Acute posthemorrhagic anemia: Secondary | ICD-10-CM | POA: Diagnosis not present

## 2019-06-28 DIAGNOSIS — R000 Tachycardia, unspecified: Secondary | ICD-10-CM | POA: Diagnosis present

## 2019-06-28 DIAGNOSIS — R1011 Right upper quadrant pain: Secondary | ICD-10-CM

## 2019-06-28 DIAGNOSIS — R159 Full incontinence of feces: Secondary | ICD-10-CM

## 2019-06-28 DIAGNOSIS — R509 Fever, unspecified: Secondary | ICD-10-CM | POA: Diagnosis present

## 2019-06-28 DIAGNOSIS — I2510 Atherosclerotic heart disease of native coronary artery without angina pectoris: Secondary | ICD-10-CM | POA: Diagnosis present

## 2019-06-28 DIAGNOSIS — Z888 Allergy status to other drugs, medicaments and biological substances status: Secondary | ICD-10-CM

## 2019-06-28 DIAGNOSIS — Z8505 Personal history of malignant neoplasm of liver: Secondary | ICD-10-CM

## 2019-06-28 DIAGNOSIS — I81 Portal vein thrombosis: Secondary | ICD-10-CM | POA: Diagnosis not present

## 2019-06-28 DIAGNOSIS — F1210 Cannabis abuse, uncomplicated: Secondary | ICD-10-CM | POA: Diagnosis present

## 2019-06-28 DIAGNOSIS — I10 Essential (primary) hypertension: Secondary | ICD-10-CM | POA: Diagnosis present

## 2019-06-28 DIAGNOSIS — B182 Chronic viral hepatitis C: Secondary | ICD-10-CM | POA: Diagnosis present

## 2019-06-28 DIAGNOSIS — Z6822 Body mass index (BMI) 22.0-22.9, adult: Secondary | ICD-10-CM

## 2019-06-28 DIAGNOSIS — M25511 Pain in right shoulder: Secondary | ICD-10-CM

## 2019-06-28 DIAGNOSIS — R0789 Other chest pain: Secondary | ICD-10-CM | POA: Diagnosis present

## 2019-06-28 DIAGNOSIS — F445 Conversion disorder with seizures or convulsions: Secondary | ICD-10-CM | POA: Diagnosis present

## 2019-06-28 DIAGNOSIS — Z885 Allergy status to narcotic agent status: Secondary | ICD-10-CM

## 2019-06-28 DIAGNOSIS — K5790 Diverticulosis of intestine, part unspecified, without perforation or abscess without bleeding: Secondary | ICD-10-CM | POA: Diagnosis present

## 2019-06-28 DIAGNOSIS — R6510 Systemic inflammatory response syndrome (SIRS) of non-infectious origin without acute organ dysfunction: Secondary | ICD-10-CM | POA: Diagnosis present

## 2019-06-28 DIAGNOSIS — I864 Gastric varices: Secondary | ICD-10-CM | POA: Diagnosis present

## 2019-06-28 HISTORY — DX: Bipolar disorder, unspecified: F31.9

## 2019-06-28 LAB — COMPREHENSIVE METABOLIC PANEL
ALT: 62 U/L — ABNORMAL HIGH (ref 0–44)
AST: 187 U/L — ABNORMAL HIGH (ref 15–41)
Albumin: 2.9 g/dL — ABNORMAL LOW (ref 3.5–5.0)
Alkaline Phosphatase: 238 U/L — ABNORMAL HIGH (ref 38–126)
Anion gap: 9 (ref 5–15)
BUN: 11 mg/dL (ref 6–20)
CO2: 20 mmol/L — ABNORMAL LOW (ref 22–32)
Calcium: 8.6 mg/dL — ABNORMAL LOW (ref 8.9–10.3)
Chloride: 108 mmol/L (ref 98–111)
Creatinine, Ser: 0.67 mg/dL (ref 0.44–1.00)
GFR calc Af Amer: >60 mL/min (ref >60)
GFR calc non Af Amer: >60 mL/min (ref >60)
Glucose, Bld: 123 mg/dL — ABNORMAL HIGH (ref 70–99)
Potassium: 4.1 mmol/L (ref 3.5–5.1)
Sodium: 137 mmol/L (ref 135–145)
Total Bilirubin: 1.5 mg/dL — ABNORMAL HIGH (ref 0.3–1.2)
Total Protein: 7.1 g/dL (ref 6.5–8.1)

## 2019-06-28 LAB — PROTIME-INR
INR: 1.1 (ref 0.8–1.2)
Prothrombin Time: 14.1 seconds (ref 11.4–15.2)

## 2019-06-28 LAB — CBC WITH DIFFERENTIAL/PLATELET
Abs Immature Granulocytes: 0.02 10*3/uL (ref 0.00–0.07)
Basophils Absolute: 0 10*3/uL (ref 0.0–0.1)
Basophils Relative: 0 %
Eosinophils Absolute: 0.1 10*3/uL (ref 0.0–0.5)
Eosinophils Relative: 2 %
HCT: 34.8 % — ABNORMAL LOW (ref 36.0–46.0)
Hemoglobin: 11.7 g/dL — ABNORMAL LOW (ref 12.0–15.0)
Immature Granulocytes: 0 %
Lymphocytes Relative: 23 %
Lymphs Abs: 1.3 10*3/uL (ref 0.7–4.0)
MCH: 31.7 pg (ref 26.0–34.0)
MCHC: 33.6 g/dL (ref 30.0–36.0)
MCV: 94.3 fL (ref 80.0–100.0)
Monocytes Absolute: 0.5 10*3/uL (ref 0.1–1.0)
Monocytes Relative: 8 %
Neutro Abs: 3.9 10*3/uL (ref 1.7–7.7)
Neutrophils Relative %: 67 %
Platelets: 108 10*3/uL — ABNORMAL LOW (ref 150–400)
RBC: 3.69 MIL/uL — ABNORMAL LOW (ref 3.87–5.11)
RDW: 15.3 % (ref 11.5–15.5)
WBC: 5.9 10*3/uL (ref 4.0–10.5)
nRBC: 0 % (ref 0.0–0.2)

## 2019-06-28 LAB — I-STAT BETA HCG BLOOD, ED (MC, WL, AP ONLY): I-stat hCG, quantitative: <5 m[IU]/mL (ref <5)

## 2019-06-28 LAB — URINALYSIS, ROUTINE W REFLEX MICROSCOPIC
Bilirubin Urine: NEGATIVE
Glucose, UA: NEGATIVE mg/dL
Ketones, ur: NEGATIVE mg/dL
Nitrite: NEGATIVE
Protein, ur: NEGATIVE mg/dL
Specific Gravity, Urine: 1.019 (ref 1.005–1.030)
pH: 5 (ref 5.0–8.0)

## 2019-06-28 LAB — LIPASE, BLOOD: Lipase: 43 U/L (ref 11–51)

## 2019-06-28 LAB — TROPONIN I (HIGH SENSITIVITY): Troponin I (High Sensitivity): 6 ng/L (ref <18)

## 2019-06-28 LAB — LACTIC ACID, PLASMA: Lactic Acid, Venous: 1.2 mmol/L (ref 0.5–1.9)

## 2019-06-28 LAB — APTT: aPTT: 32 seconds (ref 24–36)

## 2019-06-28 MED ORDER — METRONIDAZOLE IN NACL 5-0.79 MG/ML-% IV SOLN
500.0000 mg | Freq: Once | INTRAVENOUS | Status: AC
  Administered 2019-06-28: 500 mg via INTRAVENOUS
  Filled 2019-06-28: qty 100

## 2019-06-28 MED ORDER — ONDANSETRON 4 MG PO TBDP
4.0000 mg | ORAL_TABLET | Freq: Once | ORAL | Status: AC
  Administered 2019-06-28: 4 mg via ORAL

## 2019-06-28 MED ORDER — ONDANSETRON 4 MG PO TBDP
ORAL_TABLET | ORAL | Status: AC
  Filled 2019-06-28: qty 1

## 2019-06-28 MED ORDER — SODIUM CHLORIDE 0.9 % IV BOLUS
1000.0000 mL | Freq: Once | INTRAVENOUS | Status: AC
  Administered 2019-06-28: 1000 mL via INTRAVENOUS

## 2019-06-28 MED ORDER — FENTANYL CITRATE (PF) 100 MCG/2ML IJ SOLN: 50.0000 ug | Freq: Once | INTRAMUSCULAR | Status: DC

## 2019-06-28 MED ORDER — SODIUM CHLORIDE 0.9 % IV SOLN
2.0000 g | Freq: Once | INTRAVENOUS | Status: AC
  Administered 2019-06-28: 2 g via INTRAVENOUS
  Filled 2019-06-28: qty 20

## 2019-06-28 MED ORDER — IBUPROFEN 400 MG PO TABS
600.0000 mg | ORAL_TABLET | Freq: Once | ORAL | Status: AC
  Administered 2019-06-28: 600 mg via ORAL
  Filled 2019-06-28: qty 1

## 2019-06-28 MED ORDER — IOHEXOL 300 MG/ML  SOLN
100.0000 mL | Freq: Once | INTRAMUSCULAR | Status: AC | PRN
  Administered 2019-06-28: 100 mL via INTRAVENOUS

## 2019-06-28 NOTE — ED Notes (Signed)
Patient is being discharged from the Urgent [facility] and sent to the Emergency Department via wheelchair by staff. Per Dr Joseph Art, patient is stable but in need of higher level of care due to possible aneurysm. Patient is aware and verbalizes understanding of plan of care.    Vitals:   06/28/19 1909  BP: (!) 162/101  Pulse: (!) 114  Resp: (!) 24  SpO2: 97%

## 2019-06-28 NOTE — ED Triage Notes (Addendum)
Pt reports that for three weeks, she has had right posterior head pain, radiates down the right side of her neck and around to her chest. She has had diarrhea, vomiting, and leg cramping. Pt seen here prior for same symptoms, sent from urgent care today for further eval, see notes. Pt febrile in triage, says that she has also had fevers for 3 weeks.

## 2019-06-28 NOTE — ED Provider Notes (Signed)
[facility] EMERGENCY DEPARTMENT Provider Note   CSN: [id number] Arrival date & time: 06/28/19  2011     History Chief Complaint  Patient presents with  . Headache    Valerie [name] is a 54 y.o. female with a past medical history significant for hepatitis C, liver cirrhosis, pseudoseizures, and thrombocytopenia who presents to the ED from urgent care due to multiple complaints.  Patient states she has been having right-sided neck pain that radiates into her right shoulder for the past 3 weeks.  Pain also radiates to the posterior aspect the right side of her head.  Headache and neck pain associated with blurry vision and photophobia.  She was seen in the ER on 2/28 and 3/12 for the same complaint and diagnosed with MSK related neck pain.  Patient admits to intermittent fevers for the past 3 weeks which typically occur in the evenings.  T-max 102.5 on 3/12.  Patient also admits to generalized abdominal pain worse in the right upper quadrant and epigastric region associated with constant nonbloody diarrhea, nausea, and nonbloody, nonbilious emesis.  She also admits to chest pain that radiates throughout her bilateral breasts and down both her sides.  She has tried ibuprofen and muscle relaxer with no relief.  Denies sick contacts and Covid exposures.  History obtained from patient and past medical records. No interpreter used during encounter.      Past Medical History:  Diagnosis Date  . Hepatitis C   . Liver cirrhosis ([place])   . Pseudoseizures     Patient Active Problem List   Diagnosis Date Noted  . Hepatitis C 11/27/2016  . Thrombocytopenia ([place]) 11/27/2016  . Liver cirrhosis ([place]) 11/27/2016  . Elevated liver enzymes 11/27/2016  . Migraine 11/27/2016  . Hemiplegic migraine 11/27/2016  . Marijuana abuse 11/27/2016  . Normocytic anemia 11/27/2016  . Right sided weakness 11/26/2016    Past Surgical History:  Procedure Laterality Date  . CESAREAN SECTION        OB History   No obstetric history on file.     Family History  Problem Relation Age of Onset  . Heart disease Neg Hx     Social History   Tobacco Use  . Smoking status: Current Every Day Smoker  . Smokeless tobacco: Never Used  Substance Use Topics  . Alcohol use: No  . Drug use: No    Home Medications Prior to Admission medications   Medication Sig Start Date End Date Taking? Authorizing Provider  diclofenac Sodium (VOLTAREN) 1 % GEL Apply 2 g topically 4 (four) times daily. Patient not taking: Reported on 06/22/2019 06/09/19   Delia Heady, PA-C  diphenoxylate-atropine (LOMOTIL) 2.5-0.025 MG tablet Take 2 tablets by mouth 4 (four) times daily as needed for diarrhea or loose stools. 06/22/19   Charlann Lange, PA-C  hydrOXYzine (ATARAX/VISTARIL) 25 MG tablet Take 1 tablet (25 mg total) by mouth every 8 (eight) hours as needed for anxiety. Patient not taking: Reported on 06/22/2019 09/02/17   Davonna Belling, MD  ibuprofen (ADVIL) 600 MG tablet Take 1 tablet (600 mg total) by mouth every 6 (six) hours as needed. 06/22/19   Charlann Lange, PA-C  methocarbamol (ROBAXIN) 500 MG tablet Take 1 tablet (500 mg total) by mouth 2 (two) times daily. 06/22/19   Charlann Lange, PA-C  methylPREDNISolone (MEDROL DOSEPAK) 4 MG TBPK tablet Taper over 6 days. Patient not taking: Reported on 06/22/2019 06/09/19   Delia Heady, PA-C  ondansetron (ZOFRAN ODT) 4 MG disintegrating tablet Take 1  tablet (4 mg total) by mouth every 8 (eight) hours as needed for nausea or vomiting. 06/22/19   Charlann Lange, PA-C    Allergies    Norco [hydrocodone-acetaminophen] and Tylenol [acetaminophen]  Review of Systems   Review of Systems  Constitutional: Positive for chills and fever.  Respiratory: Positive for cough. Negative for shortness of breath.   Cardiovascular: Positive for chest pain. Negative for leg swelling.  Gastrointestinal: Positive for abdominal pain, diarrhea, nausea and vomiting.   Genitourinary: Negative for dysuria.  Musculoskeletal: Positive for arthralgias (right shoulder) and neck pain. Negative for neck stiffness.  Neurological: Positive for headaches.  All other systems reviewed and are negative.   Physical Exam Updated Vital Signs BP (!) 167/107   Pulse (!) 105   Temp (!) 101.5 F (38.6 C) (Oral)   Resp (!) 21   Ht '5\' 2"'$  (1.575 m)   Wt 58.5 kg   SpO2 98%   BMI 23.59 kg/m   Physical Exam Vitals and nursing note reviewed.  Constitutional:      General: She is not in acute distress.    Appearance: She is not toxic-appearing.  HENT:     Head: Normocephalic.  Eyes:     Pupils: Pupils are equal, round, and reactive to light.  Neck:     Comments: No cervical midline tenderness.  No meningismus.  Full range of motion of neck.  Reproducible right-sided trapezius tenderness to palpation. Cardiovascular:     Rate and Rhythm: Normal rate and regular rhythm.     Pulses: Normal pulses.     Heart sounds: Normal heart sounds. No murmur. No friction rub. No gallop.   Pulmonary:     Effort: Pulmonary effort is normal.     Breath sounds: Normal breath sounds.  Chest:       Comments: Reproducible anterior chest wall tenderness below anterior breasts Abdominal:     General: Abdomen is flat. Bowel sounds are normal. There is no distension.     Palpations: Abdomen is soft.     Tenderness: There is abdominal tenderness. There is guarding. There is no rebound.     Comments: Tenderness to palpation in RUQ and epigastric region with voluntary guarding.  Musculoskeletal:     Cervical back: Neck supple.  Skin:    General: Skin is warm and dry.  Neurological:     General: No focal deficit present.     Mental Status: She is alert.     Comments: Speech is clear, able to follow commands CN III-XII intact Normal strength in upper and lower extremities bilaterally including dorsiflexion and plantar flexion, strong and equal grip strength Sensation grossly intact  throughout Moves extremities without ataxia, coordination intact No pronator drift      ED Results / Procedures / Treatments   Labs (all labs ordered are listed, but only abnormal results are displayed) Labs Reviewed  CULTURE, BLOOD (ROUTINE X 2)  CULTURE, BLOOD (ROUTINE X 2)  URINE CULTURE  LACTIC ACID, PLASMA  LACTIC ACID, PLASMA  COMPREHENSIVE METABOLIC PANEL  CBC WITH DIFFERENTIAL/PLATELET  APTT  PROTIME-INR  URINALYSIS, ROUTINE W REFLEX MICROSCOPIC  LIPASE, BLOOD  I-STAT BETA HCG BLOOD, ED ([facility], [facility], [facility] ONLY)  TROPONIN I (HIGH SENSITIVITY)    EKG EKG Interpretation  Date/Time:  Friday June 28 2019 20:25:37 EDT Ventricular Rate:  108 PR Interval:  118 QRS Duration: 86 QT Interval:  340 QTC Calculation: 455 R Axis:   42 Text Interpretation: Sinus tachycardia no acute ST/T changes rate is faster  compared to Jun 21 2019 Confirmed by Sherwood Gambler [phone]) on 06/28/2019 8:52:55 PM   Radiology No results found.  Procedures Procedures (including critical care time)  Medications Ordered in ED Medications  cefTRIAXone (ROCEPHIN) 2 g in sodium chloride 0.9 % 100 mL IVPB (has no administration in time range)  sodium chloride 0.9 % bolus 1,000 mL (has no administration in time range)  ibuprofen (ADVIL) tablet 600 mg (has no administration in time range)    ED Course  I have reviewed the triage vital signs and the nursing notes.  Pertinent labs & imaging results that were available during my care of the patient were reviewed by me and considered in my medical decision making (see chart for details).  Clinical Course as of Jun 29 15  Fri Jun 28, 2019  2218 Lactic Acid, Venous: 1.2 [CA]  2218 WBC: 5.9 [CA]  2232 AST(!): 187 [CA]  2232 ALT(!): 62 [CA]  2232 Alkaline Phosphatase(!): 238 [CA]  2355 Discussed case with Dr. Bobbye Morton   [CA]    Clinical Course User Index [CA] Karel Jarvis Dois Davenport   MDM Rules/Calculators/A&P                     54 year old  female presents to the ED due to multiple complaints.  Patient admits to right-sided neck pain that radiates up to her head and down her right shoulder.  Patient also admits to severe abdominal pain associated with nonbloody diarrhea and nonbloody, nonbilious emesis.  She also admits to anterior chest wall pain located below her bilateral breasts.  Patient has been evaluated 2 times in the ED and once in urgent care for similar symptoms.  Upon arrival, patient febrile at 101.5 and tachycardic at 114. Patient in no acute distress and non-toxic appearing.  Abdomen soft, nondistended with tenderness in the right upper quadrant and epigastric region with voluntary guarding.  Reproducible right-sided neck tenderness.  No meningismus.  Doubt meningitis.  Reproducible anterior chest wall tenderness directly below her bilateral breasts.  Will obtain sepsis labs, abdominal labs, right upper quadrant ultrasound, and chest x-ray.  Patient started on IV fluids and IV antibiotics after initial evaluation.  CBC reassuring with no leukocytosis. CMP significant for elevated LFTs (AST 187 and ALT 62) and elevated alk phos at 238. LFTs appear to be chronically elevated in the past. Lipase normal at 43. Doubt pancreatitis.  UA significant for moderate amount of hematuria and trace leukocytes but rare bacteria.  Urine culture pending.  Right upper quadrant ultrasound personally reviewed which demonstrates: IMPRESSION:  Under distension of the gallbladder. Trace pericholecystic fluid is  nonspecific with absence of other features of acute cholecystitis.  Should be considered within the appropriate clinical setting.    Heterogeneous 4.6 cm mass in the posterior right lobe liver, highly  concerning for hepatocellular carcinoma given background of  cirrhosis. Consider further characterization with multiphase MRI or  CT, preferably on an outpatient basis when patient is better able to  tolerate scanning procedure.   Will  obtain CT abdomen for further evaluation of abdominal pain.  Chest x-ray personally reviewed which is significant for findings suggestive of congestive heart failure.  EKG personally reviewed which demonstrates sinus tachycardia with no acute ischemia.  Initial troponin normal at 6. Will obtain delta troponin to rule out ACS. Low suspicion given CP sounds atypical in nature. Patient will most likely need to be admitted to the hospital given repeat visits for further work-up.  CT abdomen personally reviewed which  demonstrates: IMPRESSION:  1. Probable liver cirrhosis. Large heterogeneous suspicion mass  measuring at least 8.8 cm, probably arising from the right hepatic  lobe of the liver but extending cephalad toward the right lung base  with possible violation of the capsule and invasion/involvement of  the right diaphragm. At least 1 additional smaller solid mass in the  inferior right hepatic lobe either representing metastatic disease  or multifocal disease. Correlation with MRI should be considered.  2. Numerous pulmonary nodules consistent with metastatic disease  3. Possible gallbladder wall thickening or small amount of  pericholecystic fluid as seen on recent ultrasound  4. Diffuse diverticular disease of the colon with possible wall  thickening/mild acute inflammation at the cecum and ascending colon.  5. Small amount of free fluid in the pelvis.   Discussed case with Dr. Regenia Skeeter who evaluated patient at bedside who agrees with assessment and plan. Will consult general surgery due to possible acute cholecystitis.  Discussed case with general surgery. See note above. Discussed case with Dr. Hal Hope who agrees to evaluate and admit patient for further work-up. Rapid COVID test ordered per request of Dr. Hal Hope if patient needs surgery for acute cholecystitis.  Final Clinical Impression(s) / ED Diagnoses Final diagnoses:  RUQ pain    Rx / DC Orders ED Discharge Orders     None       Suzy Bouchard, PA-C 06/29/19 0019    Sherwood Gambler, MD 06/30/19 [phone]

## 2019-06-28 NOTE — ED Triage Notes (Signed)
Patient reports continued symptoms of leg pain, neck pain that radiates into her chest wall, nausea, and inability to control bowels at time.

## 2019-06-28 NOTE — ED Notes (Signed)
Pt to US.

## 2019-06-28 NOTE — Discharge Instructions (Addendum)
You need emergency evaluation for this continuing pain

## 2019-06-28 NOTE — ED Provider Notes (Signed)
[facility]    CSN: [id number] Arrival date & time: 06/28/19  1856      History   Chief Complaint Chief Complaint  Patient presents with  . Nausea  . Neck Pain    HPI Valerie [name] is a 54 y.o. female.   Initial [facility] patient visit.  Patient reports continued symptoms of leg pain, neck pain that radiates into her chest wall, nausea, and inability to control bowels at time.   Patient has had spontaneous onset of posterior neck pain and right shoulder pain radiating down her side and arm for 2-1/2 weeks.  She normally is in good health and works a regular job.  This pain is totally incapacitated her.  She has been to the emergency room twice and a chest x-ray was done along with some lab work none of which showed the etiology of this pain.  Note from 06/25/2019: Patient to ED with pain in the right side of her neck that radiates to the right shoulder and right chest wall. Symptoms started 2 weeks ago. She was seen and evaluated in the ED for same and treated with Robaxin, Voltaren Gel and a steroid dose pack. She states she got some relief but her symptoms are now heightened again. 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 She is taking over-the-counter medications at this point but reports little relief. The pain is exacerbated by movement, improved by lying on her right side. No headache, SoB, cough, congestion, fever. She reports onset nausea with infrequent vomiting and >10 daily episodes diarrhea that started 3 days ago. She feels she has had a fever for the same 3 days. No urinary symptoms. She reports a positive COVID exposure at work over the past week.     Past Medical History:  Diagnosis Date  . Hepatitis C   . Liver cirrhosis ([place])   . Pseudoseizures     Patient Active Problem List   Diagnosis Date Noted  . Hepatitis C 11/27/2016  . Thrombocytopenia ([place]) 11/27/2016  . Liver cirrhosis ([place]) 11/27/2016  . Elevated liver enzymes 11/27/2016  .  Migraine 11/27/2016  . Hemiplegic migraine 11/27/2016  . Marijuana abuse 11/27/2016  . Normocytic anemia 11/27/2016  . Right sided weakness 11/26/2016    Past Surgical History:  Procedure Laterality Date  . CESAREAN SECTION      OB History   No obstetric history on file.      Home Medications    Prior to Admission medications   Medication Sig Start Date End Date Taking? Authorizing Provider  diclofenac Sodium (VOLTAREN) 1 % GEL Apply 2 g topically 4 (four) times daily. Patient not taking: Reported on 06/22/2019 06/09/19   Delia Heady, PA-C  diphenoxylate-atropine (LOMOTIL) 2.5-0.025 MG tablet Take 2 tablets by mouth 4 (four) times daily as needed for diarrhea or loose stools. 06/22/19   Charlann Lange, PA-C  hydrOXYzine (ATARAX/VISTARIL) 25 MG tablet Take 1 tablet (25 mg total) by mouth every 8 (eight) hours as needed for anxiety. Patient not taking: Reported on 06/22/2019 09/02/17   Davonna Belling, MD  ibuprofen (ADVIL) 600 MG tablet Take 1 tablet (600 mg total) by mouth every 6 (six) hours as needed. 06/22/19   Charlann Lange, PA-C  methocarbamol (ROBAXIN) 500 MG tablet Take 1 tablet (500 mg total) by mouth 2 (two) times daily. 06/22/19   Charlann Lange, PA-C  methylPREDNISolone (MEDROL DOSEPAK) 4 MG TBPK tablet Taper over 6 days. Patient not taking: Reported on 06/22/2019 06/09/19  Khatri, Hina, PA-C  ondansetron (ZOFRAN ODT) 4 MG disintegrating tablet Take 1 tablet (4 mg total) by mouth every 8 (eight) hours as needed for nausea or vomiting. 06/22/19   Charlann Lange, PA-C    Family History Family History  Problem Relation Age of Onset  . Heart disease Neg Hx     Social History Social History   Tobacco Use  . Smoking status: Current Every Day Smoker  . Smokeless tobacco: Never Used  Substance Use Topics  . Alcohol use: No  . Drug use: No     Allergies   Norco [hydrocodone-acetaminophen] and Tylenol [acetaminophen]   Review of Systems Review of Systems    Respiratory: Positive for chest tightness.   Gastrointestinal: Positive for diarrhea, nausea and vomiting.  All other systems reviewed and are negative.    Physical Exam Triage Vital Signs ED Triage Vitals  Enc Vitals Group     BP 06/28/19 1909 (!) 162/101     Pulse Rate 06/28/19 1909 (!) 114     Resp 06/28/19 1909 (!) 24     Temp --      Temp src --      SpO2 06/28/19 1909 97 %     Weight --      Height --      Head Circumference --      Peak Flow --      Pain Score 06/28/19 1908 10     Pain Loc --      Pain Edu? --      Excl. in [place]? --    No data found.  Updated Vital Signs BP (!) 162/101 (BP Location: Left Arm)   Pulse (!) 114   Resp (!) 24   SpO2 97%    Physical Exam Vitals and nursing note reviewed.  Constitutional:      General: She is in acute distress.     Appearance: She is ill-appearing.  HENT:     Head: Normocephalic.  Eyes:     Conjunctiva/sclera: Conjunctivae normal.  Neck:     Vascular: No carotid bruit.  Cardiovascular:     Rate and Rhythm: Tachycardia present.     Heart sounds: Normal heart sounds.  Pulmonary:     Effort: Pulmonary effort is normal.     Breath sounds: Normal breath sounds.  Abdominal:     Palpations: Abdomen is soft.     Tenderness: There is abdominal tenderness.  Musculoskeletal:        General: Normal range of motion.  Skin:    General: Skin is warm and dry.  Neurological:     General: No focal deficit present.     Mental Status: She is alert and oriented to person, place, and time.  Psychiatric:        Mood and Affect: Mood normal.        Behavior: Behavior normal.        Thought Content: Thought content normal.        Judgment: Judgment normal.   Patient is writhing on the exam table holding the back of her right neck and right rib cage.   UC Treatments / Results  Labs (all labs ordered are listed, but only abnormal results are displayed) Labs Reviewed - No data to display  EKG   Radiology No results  found.  Procedures Procedures (including critical care time)  Medications Ordered in UC Medications  ondansetron (ZOFRAN-ODT) disintegrating tablet 4 mg (4 mg Oral Given 06/28/19 1908)    Initial  Impression / Assessment and Plan / UC Course  I have reviewed the triage vital signs and the nursing notes.  Pertinent labs & imaging results that were available during my care of the patient were reviewed by me and considered in my medical decision making (see chart for details).  The only time I seen somebody in this much pain in this area has been a patient with an aneurysm.  Patient normally is very appropriate and is not a complainer.  This new syndrome needs further work-up.    Final Clinical Impressions(s) / UC Diagnoses   Final diagnoses:  Neck pain  Incontinence of feces, unspecified fecal incontinence type  Acute pain of right shoulder     Discharge Instructions     You need emergency evaluation for this continuing pain    ED Prescriptions    None     I have reviewed the PDMP during this encounter.   Robyn Haber, MD 06/28/19 2008

## 2019-06-29 ENCOUNTER — Inpatient Hospital Stay (HOSPITAL_COMMUNITY): Payer: Medicaid Other

## 2019-06-29 ENCOUNTER — Other Ambulatory Visit (HOSPITAL_COMMUNITY): Payer: Medicaid Other

## 2019-06-29 ENCOUNTER — Encounter (HOSPITAL_COMMUNITY): Payer: Self-pay | Admitting: Internal Medicine

## 2019-06-29 DIAGNOSIS — Z888 Allergy status to other drugs, medicaments and biological substances status: Secondary | ICD-10-CM | POA: Diagnosis not present

## 2019-06-29 DIAGNOSIS — B182 Chronic viral hepatitis C: Secondary | ICD-10-CM | POA: Diagnosis present

## 2019-06-29 DIAGNOSIS — D649 Anemia, unspecified: Secondary | ICD-10-CM

## 2019-06-29 DIAGNOSIS — R000 Tachycardia, unspecified: Secondary | ICD-10-CM | POA: Diagnosis present

## 2019-06-29 DIAGNOSIS — C787 Secondary malignant neoplasm of liver and intrahepatic bile duct: Secondary | ICD-10-CM | POA: Diagnosis present

## 2019-06-29 DIAGNOSIS — F1210 Cannabis abuse, uncomplicated: Secondary | ICD-10-CM | POA: Diagnosis present

## 2019-06-29 DIAGNOSIS — D693 Immune thrombocytopenic purpura: Secondary | ICD-10-CM

## 2019-06-29 DIAGNOSIS — Z66 Do not resuscitate: Secondary | ICD-10-CM | POA: Diagnosis present

## 2019-06-29 DIAGNOSIS — D638 Anemia in other chronic diseases classified elsewhere: Secondary | ICD-10-CM | POA: Diagnosis present

## 2019-06-29 DIAGNOSIS — Z515 Encounter for palliative care: Secondary | ICD-10-CM | POA: Diagnosis not present

## 2019-06-29 DIAGNOSIS — R1011 Right upper quadrant pain: Secondary | ICD-10-CM | POA: Diagnosis present

## 2019-06-29 DIAGNOSIS — R509 Fever, unspecified: Secondary | ICD-10-CM | POA: Diagnosis present

## 2019-06-29 DIAGNOSIS — R519 Headache, unspecified: Secondary | ICD-10-CM | POA: Diagnosis present

## 2019-06-29 DIAGNOSIS — Z885 Allergy status to narcotic agent status: Secondary | ICD-10-CM | POA: Diagnosis not present

## 2019-06-29 DIAGNOSIS — E440 Moderate protein-calorie malnutrition: Secondary | ICD-10-CM | POA: Diagnosis present

## 2019-06-29 DIAGNOSIS — R748 Abnormal levels of other serum enzymes: Secondary | ICD-10-CM

## 2019-06-29 DIAGNOSIS — D62 Acute posthemorrhagic anemia: Secondary | ICD-10-CM | POA: Diagnosis not present

## 2019-06-29 DIAGNOSIS — R109 Unspecified abdominal pain: Secondary | ICD-10-CM | POA: Diagnosis present

## 2019-06-29 DIAGNOSIS — I10 Essential (primary) hypertension: Secondary | ICD-10-CM

## 2019-06-29 DIAGNOSIS — Z923 Personal history of irradiation: Secondary | ICD-10-CM | POA: Diagnosis not present

## 2019-06-29 DIAGNOSIS — I8510 Secondary esophageal varices without bleeding: Secondary | ICD-10-CM | POA: Diagnosis present

## 2019-06-29 DIAGNOSIS — R6510 Systemic inflammatory response syndrome (SIRS) of non-infectious origin without acute organ dysfunction: Secondary | ICD-10-CM

## 2019-06-29 DIAGNOSIS — R0789 Other chest pain: Secondary | ICD-10-CM | POA: Diagnosis present

## 2019-06-29 DIAGNOSIS — I81 Portal vein thrombosis: Secondary | ICD-10-CM | POA: Diagnosis not present

## 2019-06-29 DIAGNOSIS — Z20822 Contact with and (suspected) exposure to covid-19: Secondary | ICD-10-CM | POA: Diagnosis present

## 2019-06-29 DIAGNOSIS — D696 Thrombocytopenia, unspecified: Secondary | ICD-10-CM

## 2019-06-29 DIAGNOSIS — F445 Conversion disorder with seizures or convulsions: Secondary | ICD-10-CM | POA: Diagnosis present

## 2019-06-29 DIAGNOSIS — F419 Anxiety disorder, unspecified: Secondary | ICD-10-CM | POA: Diagnosis present

## 2019-06-29 DIAGNOSIS — H538 Other visual disturbances: Secondary | ICD-10-CM | POA: Diagnosis present

## 2019-06-29 DIAGNOSIS — K7460 Unspecified cirrhosis of liver: Secondary | ICD-10-CM | POA: Diagnosis present

## 2019-06-29 LAB — PROTIME-INR
INR: 1.2 (ref 0.8–1.2)
Prothrombin Time: 14.7 seconds (ref 11.4–15.2)

## 2019-06-29 LAB — LACTIC ACID, PLASMA
Lactic Acid, Venous: 0.7 mmol/L (ref 0.5–1.9)
Lactic Acid, Venous: 1.1 mmol/L (ref 0.5–1.9)
Lactic Acid, Venous: 0.8 mmol/L (ref 0.5–1.9)

## 2019-06-29 LAB — IRON AND TIBC
Iron: 52 ug/dL (ref 28–170)
Saturation Ratios: 22 % (ref 10.4–31.8)
TIBC: 241 ug/dL — ABNORMAL LOW (ref 250–450)
UIBC: 189 ug/dL

## 2019-06-29 LAB — BASIC METABOLIC PANEL
Anion gap: 10 (ref 5–15)
BUN: 8 mg/dL (ref 6–20)
CO2: 21 mmol/L — ABNORMAL LOW (ref 22–32)
Calcium: 8.2 mg/dL — ABNORMAL LOW (ref 8.9–10.3)
Chloride: 106 mmol/L (ref 98–111)
Creatinine, Ser: 0.65 mg/dL (ref 0.44–1.00)
GFR calc Af Amer: >60 mL/min (ref >60)
GFR calc non Af Amer: >60 mL/min (ref >60)
Glucose, Bld: 128 mg/dL — ABNORMAL HIGH (ref 70–99)
Potassium: 3.2 mmol/L — ABNORMAL LOW (ref 3.5–5.1)
Sodium: 137 mmol/L (ref 135–145)

## 2019-06-29 LAB — CBC WITH DIFFERENTIAL/PLATELET
Abs Immature Granulocytes: 0.01 10*3/uL (ref 0.00–0.07)
Basophils Absolute: 0 10*3/uL (ref 0.0–0.1)
Basophils Relative: 0 %
Eosinophils Absolute: 0.1 10*3/uL (ref 0.0–0.5)
Eosinophils Relative: 2 %
HCT: 31.2 % — ABNORMAL LOW (ref 36.0–46.0)
Hemoglobin: 10.6 g/dL — ABNORMAL LOW (ref 12.0–15.0)
Immature Granulocytes: 0 %
Lymphocytes Relative: 32 %
Lymphs Abs: 1.9 10*3/uL (ref 0.7–4.0)
MCH: 32 pg (ref 26.0–34.0)
MCHC: 34 g/dL (ref 30.0–36.0)
MCV: 94.3 fL (ref 80.0–100.0)
Monocytes Absolute: 0.6 10*3/uL (ref 0.1–1.0)
Monocytes Relative: 10 %
Neutro Abs: 3.3 10*3/uL (ref 1.7–7.7)
Neutrophils Relative %: 56 %
Platelets: 97 10*3/uL — ABNORMAL LOW (ref 150–400)
RBC: 3.31 MIL/uL — ABNORMAL LOW (ref 3.87–5.11)
RDW: 15.3 % (ref 11.5–15.5)
WBC: 6 10*3/uL (ref 4.0–10.5)
nRBC: 0 % (ref 0.0–0.2)

## 2019-06-29 LAB — FOLATE: Folate: 7.2 ng/mL (ref >5.9)

## 2019-06-29 LAB — FERRITIN: Ferritin: 745 ng/mL — ABNORMAL HIGH (ref 11–307)

## 2019-06-29 LAB — HEPATIC FUNCTION PANEL
ALT: 53 U/L — ABNORMAL HIGH (ref 0–44)
AST: 151 U/L — ABNORMAL HIGH (ref 15–41)
Albumin: 2.5 g/dL — ABNORMAL LOW (ref 3.5–5.0)
Alkaline Phosphatase: 207 U/L — ABNORMAL HIGH (ref 38–126)
Bilirubin, Direct: 0.5 mg/dL — ABNORMAL HIGH (ref 0.0–0.2)
Indirect Bilirubin: 0.6 mg/dL (ref 0.3–0.9)
Total Bilirubin: 1.1 mg/dL (ref 0.3–1.2)
Total Protein: 6.1 g/dL — ABNORMAL LOW (ref 6.5–8.1)

## 2019-06-29 LAB — TROPONIN I (HIGH SENSITIVITY): Troponin I (High Sensitivity): 8 ng/L (ref <18)

## 2019-06-29 LAB — HIV ANTIBODY (ROUTINE TESTING W REFLEX): HIV Screen 4th Generation wRfx: NONREACTIVE

## 2019-06-29 LAB — RESPIRATORY PANEL BY RT PCR (FLU A&B, COVID)
Influenza A by PCR: NEGATIVE
Influenza B by PCR: NEGATIVE
SARS Coronavirus 2 by RT PCR: NEGATIVE

## 2019-06-29 LAB — RETICULOCYTES
Immature Retic Fract: 9.3 % (ref 2.3–15.9)
RBC.: 3.51 MIL/uL — ABNORMAL LOW (ref 3.87–5.11)
Retic Count, Absolute: 70.9 10*3/uL (ref 19.0–186.0)
Retic Ct Pct: 2 % (ref 0.4–3.1)

## 2019-06-29 LAB — PROCALCITONIN: Procalcitonin: 0.37 ng/mL

## 2019-06-29 LAB — APTT: aPTT: 32 seconds (ref 24–36)

## 2019-06-29 LAB — VITAMIN B12: Vitamin B-12: 2620 pg/mL — ABNORMAL HIGH (ref 180–914)

## 2019-06-29 MED ORDER — TRAMADOL HCL 50 MG PO TABS
50.0000 mg | ORAL_TABLET | Freq: Once | ORAL | Status: AC
  Administered 2019-06-29: 50 mg via ORAL
  Filled 2019-06-29: qty 1

## 2019-06-29 MED ORDER — ONDANSETRON HCL 4 MG/2ML IJ SOLN
4.0000 mg | Freq: Four times a day (QID) | INTRAMUSCULAR | Status: DC | PRN
  Administered 2019-06-30 – 2019-07-04 (×3): 4 mg via INTRAVENOUS
  Filled 2019-06-29 (×3): qty 2

## 2019-06-29 MED ORDER — TRAMADOL HCL 50 MG PO TABS
50.0000 mg | ORAL_TABLET | Freq: Four times a day (QID) | ORAL | Status: DC | PRN
  Administered 2019-06-29 – 2019-07-04 (×9): 50 mg via ORAL
  Filled 2019-06-29 (×9): qty 1

## 2019-06-29 MED ORDER — HYDRALAZINE HCL 20 MG/ML IJ SOLN
5.0000 mg | Freq: Four times a day (QID) | INTRAMUSCULAR | Status: DC | PRN
  Administered 2019-07-02: 5 mg via INTRAVENOUS
  Filled 2019-06-29: qty 1

## 2019-06-29 MED ORDER — HYDRALAZINE HCL 20 MG/ML IJ SOLN: 10.0000 mg | INTRAMUSCULAR | Status: DC | PRN

## 2019-06-29 MED ORDER — PIPERACILLIN-TAZOBACTAM 3.375 G IVPB 30 MIN
3.3750 g | Freq: Once | INTRAVENOUS | Status: AC
  Administered 2019-06-29: 3.375 g via INTRAVENOUS
  Filled 2019-06-29: qty 50

## 2019-06-29 MED ORDER — ENSURE ENLIVE PO LIQD
237.0000 mL | Freq: Two times a day (BID) | ORAL | Status: DC
  Administered 2019-06-29 – 2019-07-04 (×6): 237 mL via ORAL

## 2019-06-29 MED ORDER — POTASSIUM CHLORIDE 10 MEQ/100ML IV SOLN
10.0000 meq | Freq: Once | INTRAVENOUS | Status: AC
  Administered 2019-06-29: 10 meq via INTRAVENOUS
  Filled 2019-06-29: qty 100

## 2019-06-29 MED ORDER — FENTANYL CITRATE (PF) 100 MCG/2ML IJ SOLN
50.0000 ug | Freq: Once | INTRAMUSCULAR | Status: AC
  Administered 2019-06-29: 50 ug via INTRAVENOUS
  Filled 2019-06-29: qty 2

## 2019-06-29 MED ORDER — POTASSIUM CHLORIDE 10 MEQ/100ML IV SOLN
10.0000 meq | INTRAVENOUS | Status: AC
  Administered 2019-06-29 (×4): 10 meq via INTRAVENOUS
  Filled 2019-06-29 (×2): qty 100

## 2019-06-29 MED ORDER — PIPERACILLIN-TAZOBACTAM 3.375 G IVPB
3.3750 g | Freq: Three times a day (TID) | INTRAVENOUS | Status: DC
  Administered 2019-06-29 – 2019-06-30 (×4): 3.375 g via INTRAVENOUS
  Filled 2019-06-29 (×5): qty 50

## 2019-06-29 MED ORDER — METOPROLOL TARTRATE 5 MG/5ML IV SOLN
5.0000 mg | Freq: Four times a day (QID) | INTRAVENOUS | Status: DC
  Administered 2019-06-29 – 2019-07-04 (×20): 5 mg via INTRAVENOUS
  Filled 2019-06-29 (×20): qty 5

## 2019-06-29 MED ORDER — GADOBUTROL 1 MMOL/ML IV SOLN
5.4000 mL | Freq: Once | INTRAVENOUS | Status: AC | PRN
  Administered 2019-06-29: 5.4 mL via INTRAVENOUS

## 2019-06-29 MED ORDER — ONDANSETRON HCL 4 MG PO TABS: 4.0000 mg | ORAL_TABLET | Freq: Four times a day (QID) | ORAL | Status: DC | PRN

## 2019-06-29 MED ORDER — SODIUM CHLORIDE 0.9 % IV SOLN: INTRAVENOUS | Status: DC

## 2019-06-29 MED ORDER — FENTANYL CITRATE (PF) 100 MCG/2ML IJ SOLN
25.0000 ug | INTRAMUSCULAR | Status: DC | PRN
  Administered 2019-06-29 – 2019-06-30 (×6): 25 ug via INTRAVENOUS
  Filled 2019-06-29 (×6): qty 2

## 2019-06-29 NOTE — Care Management Note (Signed)
Case Management Note  Patient Details  Name: Valerie [name] MRN: [id number] Date of Birth: December 15, 1965  Subjective/Objective: 54 yo F admitted with abdominal pain, N/V and diarrhea. Hx of cirrhosis with hepatitis C. Pt doesn't have insurance. Met with pt. She reports that she lives with her daughter. She has a PCP at the [facility] and she has an appointment on April 12th. She reports that she gets her medications at the [facility]. Discussed with pt the importance of seeing her physician after she is D/C from the hospital and encouraged her to contact the [facility] after she is D/C to f/u with her physician. She reports that she has a car and she drives. She denies any D/C needs at this time. Will continue to f/u to assist with the D/C plan.    Action/Plan:   Expected Discharge Date:                  Expected Discharge Plan:     In-House Referral:     Discharge planning Services     Post Acute Care Choice:    Choice offered to:     DME Arranged:    DME Agency:     HH Arranged:    HH Agency:     Status of Service:     If discussed at [employer] of Stay Meetings, dates discussed:    Additional Comments:  Norina Buzzard, RN 06/29/2019, 2:39 PM

## 2019-06-29 NOTE — Procedures (Signed)
Echo attempted. Upon getting set up in room, nurse entered room and stated patient was moments from being transported to MRI. Echo will be attempted again.

## 2019-06-29 NOTE — Plan of Care (Signed)

## 2019-06-29 NOTE — Progress Notes (Signed)
Initial Nutrition Assessment  **RD working remotely**  DOCUMENTATION CODES:   Not applicable  INTERVENTION:  Continue Ensure Enlive po BID, each supplement provides 350 kcal and 20 grams of protein   NUTRITION DIAGNOSIS:   Predicted suboptimal nutrient intake related to nausea as evidenced by per patient/family report.    GOAL:   Patient will meet greater than or equal to 90% of their needs    MONITOR:   PO intake, Supplement acceptance, Weight trends, Labs  REASON FOR ASSESSMENT:   Malnutrition Screening Tool    ASSESSMENT:   Pt with a PMH significant for cirrhosis 2/2 HepC presented with a 3 week hx of abdominal pain, N/V, and diarrhea. Pt admitted for SIRS concerning for cholecystitis. CT revealed liver mass concerning for  Hepatocellular carcinoma and possible metastatic lesions in the lung.   RD unable to reach pt via phone.   PO intake: 25% x 1 recorded meal. Pt currently NPO for MRI of abdomen. Pt was on a Heart Healthy diet.   Pt noted to have a 8% wt loss x 1 week, which is significant for time frame. Malnutrition is suspected; however, cannot diagnose without detailed diet history and/or nutrition-focused physical exam.   Medications reviewed and include: Ensure Enlive BID, IV abx  Labs reviewed: K+ 3.2 (L)    NUTRITION - FOCUSED PHYSICAL EXAM:  RD unable to perform at this time.    Diet Order:   Diet Order            Diet NPO time specified Except for: Sips with Meds  Diet effective now              EDUCATION NEEDS:   No education needs have been identified at this time  Skin:  Skin Assessment: Reviewed RN Assessment  Last BM:  3/19  Height:   Ht Readings from Last 1 Encounters:  06/28/19 5\' 2"  (1.575 m)    Weight:   Wt Readings from Last 3 Encounters:  06/29/19 54.9 kg  06/21/19 60 kg  06/09/19 60 kg    BMI:  Body mass index is 22.15 kg/m.  Estimated Nutritional Needs:   Kcal:  1400-1600  Protein:  70-85  grams  Fluid:  >/= 1.4L   Larkin Ina, MS, RD, LDN RD pager number and weekend/on-call pager number located in [place].

## 2019-06-29 NOTE — Plan of Care (Signed)
  Problem: Education: Goal: Knowledge of General Education information will improve Description: Including pain rating scale, medication(s)/side effects and non-pharmacologic comfort measures Outcome: Progressing  Patient states understanding of disease process, such as rationale for imaging.  Problem: Clinical Measurements: Goal: Will remain free from infection Outcome: Progressing  Patient afebrile this shift.  Problem: Clinical Measurements: Goal: Respiratory complications will improve Outcome: Progressing  Pt up to bathroom w/o c/o SOB.  Problem: Pain Managment: Goal: General experience of comfort will improve Outcome: Progressing  Patient states pain well controlled with PRN PO meds.

## 2019-06-29 NOTE — Plan of Care (Signed)
New admit

## 2019-06-29 NOTE — Progress Notes (Signed)
PROGRESS NOTE    Valerie [name]  MLJ:[id number] DOB: June 22, 1965 DOA: 06/28/2019 PCP: Patient, No Pcp Per     Brief Narrative:  Valerie [name] is a 54 y.o. BF PMHx Hx  liver cirrhosis with hepatitis C and per patient patient was treated with radiation for possible liver cancer in 2004 in [place] to the ER because of persistent abdominal pain nausea vomiting diarrhea over the last 3 weeks with posterior occipital headache radiating to her neck.  Denies any weakness of the upper or lower extremities.  Had come to the ER twice.  Given these worsening symptoms patient comes back to the ER again after following up with the primary care.  Denies any chest pain or shortness of breath or productive cough.  ED Course: In the ER Covid test was negative patient was febrile with temperature of 101.5 tachycardic labs showing hemoglobin of 11.7 platelets 108 LFTs with AST of 187 ALT 62 lactic acid was normal.  Patient had CT abdomen pelvis which shows features concerning for hepatocellular carcinoma along the right hepatic lobe with also possible cecal and ascending colon colitis and also possible metastatic lesions in the lung involving the diaphragm also.  Since there was concern for possible cholecystitis jogging was seen also in the sonogram of the abdomen on-call general surgeon was consulted.  Patient started on empiric antibiotics and admitted for further management.   Subjective: A/O x4, positive RUQ abdominal pain states has been to the [facility], ED 3 times until she was finally admitted.  States hepatitis C was fully treated many years ago in [facility] does not recall the name of the GI physician.   Assessment & Plan:   Principal Problem:   SIRS (systemic inflammatory response syndrome) (HCC) Active Problems:   Thrombocytopenia (HCC)   Liver cirrhosis (HCC)   Elevated liver enzymes   Fever   Abdominal pain  SIRS/RUQ pain -Patient started on  antibiotics for cholecystitis although patient has elevated alk phos and AST/ALT do not believe secondary to infection however given patient's tenuous condition will continue empiric coverage. -Most likely secondary to metastatic HCC -Normal saline 33m/hr  Hx liver cirrhosis with hepatitis C -Acute hepatitis panel pending -Hepatitis C RNA quant pending  Metastatic Hepatocellular carcinoma -Multiphasic MRI abdomen liver protocol pending -AFP pending -After obtaining multiphasic MRI abdomen consult oncology and surgery  CHF/pulmonary HTN? -Echocardiogram pending   Hypokalemia -Potassium goal> 4 -Potassium IV 50 mEq  Chronic anemia?  (Baseline HgB= 12) -Anemia panel pending -PT/INR pending -Occult blood pending  Chronic thrombocytopenia (platelet baseline 100) -Given patient's chronically low platelets would hold all heparin products.  Pain control -Tramadol PRN -Fentanyl IV PRN  Essential HTN -Metoprolol IV 5 mg QID -Hydralazine PRN    DVT prophylaxis: SCD Code Status: DNR Family Communication: 3/20 spoke with daughter over the phone counseled on plan of care answered all questions Disposition Plan: TBD 1.  Where the patient is from 2.  Anticipated d/c place. 3.  Barriers to d/c depletion cancer work-up    Consultants:    Procedures/Significant Events:  3/19 PCXR; findings suggestive of CHF 3/19 RUQ abdominal ultrasound;-under distension of the gallbladder. -Trace pericholecystic fluid is nonspecific with absence of other features of acute cholecystitis. Should be considered within the appropriate clinical setting. -Heterogeneous 4.6 cm mass in the posterior right lobe liver, highly concerning for hepatocellular carcinoma given background of cirrhosis. Consider further characterization with multiphase MRI or CT, preferably on an outpatient basis when patient is  better able to tolerate scanning procedure. 3/19 CT abdomen pelvis W contrast;-probable liver  cirrhosis. Large heterogeneous suspicion mass measuring at least 8.8 cm, probably arising from the right hepatic lobe of the liver but extending cephalad toward the right lung base with possible violation of the capsule and invasion/involvement of the right diaphragm. -At least 1 additional smaller solid mass in the inferior right hepatic lobe either representing metastatic disease or multifocal disease. Correlation with MRI should be considered. -Numerous pulmonary nodules consistent with metastatic disease -. Possible gallbladder wall thickening or small amount of pericholecystic fluid as seen on recent ultrasound -Diffuse diverticular disease of the colon with possible wall thickening/mild acute inflammation at the cecum and ascending colon. 3/20 CT head W. Wo contrast; negative intracranial abnormality   I have personally reviewed and interpreted all radiology studies and my findings are as above.  VENTILATOR SETTINGS:    Cultures   Antimicrobials:    Devices    LINES / TUBES:      Continuous Infusions: . piperacillin-tazobactam (ZOSYN)  IV       Objective: Vitals:   06/29/19 0100 06/29/19 0115 06/29/19 0130 06/29/19 0306  BP: (!) 159/94 (!) 147/96 (!) 143/79 (!) 171/88  Pulse:    86  Resp: (!) _0 Temp:    98.2 F (36.8 C)  TempSrc:    Oral  SpO2:    97%  Weight:    54.9 kg  Height:        Intake/Output Summary (Last 24 hours) at 06/29/2019 3244 Last data filed at 06/29/2019 0600 Gross per 24 hour  Intake 138.1 ml  Output --  Net 138.1 ml   Filed Weights   06/28/19 2025 06/29/19 0306  Weight: 58.5 kg 54.9 kg    Examination:  General: A/O x4, no acute respiratory distress, cachectic Eyes: negative scleral hemorrhage, negative anisocoria, negative icterus ENT: Negative Runny nose, negative gingival bleeding, Neck:  Negative scars, masses, torticollis, lymphadenopathy, JVD Lungs: Clear to auscultation bilaterally without wheezes or  crackles Cardiovascular: Regular rate and rhythm without murmur gallop or rub normal S1 and S2 Abdomen: Positive abdominal pain RUQ>> remainder of abdomen, nondistended, positive hypoactive, bowel sounds, no rebound, no ascites, no appreciable mass Extremities: No significant cyanosis, clubbing, or edema bilateral lower extremities Skin: Negative rashes, lesions, ulcers, multiple tattoos Psychiatric:  Negative depression, negative anxiety, negative fatigue, negative mania  Central nervous system:  Cranial nerves II through XII intact, tongue/uvula midline, all extremities muscle strength 5/5, sensation intact throughout,negative dysarthria, negative expressive aphasia, negative receptive aphasia.  .     Data Reviewed: Care during the described time interval was provided by me .  I have reviewed this patient's available data, including medical history, events of note, physical examination, and all test results as part of my evaluation.  CBC: Recent Labs  Lab 06/28/19 2123 06/29/19 0418  WBC 5.9 6.0  NEUTROABS 3.9 3.3  HGB 11.7* 10.6*  HCT 34.8* 31.2*  MCV 94.3 94.3  PLT 108* 97*   Basic Metabolic Panel: Recent Labs  Lab 06/28/19 2123 06/29/19 0418  NA 137 137  K 4.1 3.2*  CL 108 106  CO2 20* 21*  GLUCOSE 123* 128*  BUN 11 8  CREATININE 0.67 0.65  CALCIUM 8.6* 8.2*   GFR: Estimated Creatinine Clearance: 64.3 mL/min (by C-G formula based on SCr of 0.65 mg/dL). Liver Function Tests: Recent Labs  Lab 06/28/19 2123 06/29/19 0418  AST 187* 151*  ALT 62* 53*  ALKPHOS 238* 207*  BILITOT 1.5* 1.1  PROT 7.1 6.1*  ALBUMIN 2.9* 2.5*   Recent Labs  Lab 06/28/19 2123  LIPASE 43   No results for input(s): AMMONIA in the last 168 hours. Coagulation Profile: Recent Labs  Lab 06/28/19 2123  INR 1.1   Cardiac Enzymes: No results for input(s): CKTOTAL, CKMB, CKMBINDEX, TROPONINI in the last 168 hours. BNP (last 3 results) No results for input(s): PROBNP in the last  8760 hours. HbA1C: No results for input(s): HGBA1C in the last 72 hours. CBG: No results for input(s): GLUCAP in the last 168 hours. Lipid Profile: No results for input(s): CHOL, HDL, LDLCALC, TRIG, CHOLHDL, LDLDIRECT in the last 72 hours. Thyroid Function Tests: No results for input(s): TSH, T4TOTAL, FREET4, T3FREE, THYROIDAB in the last 72 hours. Anemia Panel: No results for input(s): VITAMINB12, FOLATE, FERRITIN, TIBC, IRON, RETICCTPCT in the last 72 hours. Sepsis Labs: Recent Labs  Lab 06/28/19 2122 06/28/19 2249  LATICACIDVEN 1.2 0.7    Recent Results (from the past 240 hour(s))  SARS CORONAVIRUS 2 (TAT 6-24 HRS) Nasopharyngeal Nasopharyngeal Swab     Status: None   Collection Time: 06/22/19  3:16 AM   Specimen: Nasopharyngeal Swab  Result Value Ref Range Status   SARS Coronavirus 2 NEGATIVE NEGATIVE Final    Comment: Performed at [facility], [place] [address], [place], [place] [address]  Blood Culture (routine x 2)     Status: None (Preliminary result)   Collection Time: 06/28/19  9:28 PM   Specimen: BLOOD  Result Value Ref Range Status   Specimen Description BLOOD SITE NOT SPECIFIED  Final   Special Requests   Final    BOTTLES DRAWN AEROBIC AND ANAEROBIC Blood Culture results may not be optimal due to an excessive volume of blood received in culture bottles   Culture   Final    NO GROWTH < 12 HOURS Performed at [facility], [place] [address] [place], [place] [address]    Report Status PENDING  Incomplete  Blood Culture (routine x 2)     Status: None (Preliminary result)   Collection Time: 06/28/19  9:28 PM   Specimen: BLOOD  Result Value Ref Range Status   Specimen Description BLOOD SITE NOT SPECIFIED  Final   Special Requests   Final    BOTTLES DRAWN AEROBIC AND ANAEROBIC Blood Culture results may not be optimal due to an excessive volume of blood received in culture bottles   Culture   Final    NO GROWTH < 12 HOURS Performed at [facility], [place] [address], [place], [place] [address]    Report Status PENDING  Incomplete  Respiratory Panel by RT PCR (Flu A&B, Covid) - Nasopharyngeal Swab     Status: None   Collection Time: 06/29/19 12:18 AM   Specimen: Nasopharyngeal Swab  Result Value Ref Range Status   SARS Coronavirus 2 by RT PCR NEGATIVE NEGATIVE Final    Comment: (NOTE) SARS-CoV-2 target nucleic acids are NOT DETECTED. The SARS-CoV-2 RNA is generally detectable in upper respiratoy specimens during the acute phase of infection. The lowest concentration of SARS-CoV-2 viral copies this assay can detect is 131 copies/mL. A negative result does not preclude SARS-Cov-2 infection and should not be used as the sole basis for treatment or other patient management decisions. A negative result may occur with  improper specimen collection/handling, submission of specimen other than nasopharyngeal swab, presence of viral mutation(s) within the areas targeted by this assay, and inadequate number of  viral copies (<131 copies/mL). A negative result must be combined with clinical observations, patient history, and epidemiological information. The expected result is Negative. Fact Sheet for Patients:  PinkCheek.be Fact Sheet for Healthcare Providers:  GravelBags.it This test is not yet ap proved or cleared by the [place] FDA and  has been authorized for detection and/or diagnosis of SARS-CoV-2 by FDA under an Emergency Use Authorization (EUA). This EUA will remain  in effect (meaning this test can be used) for the duration of the COVID-19 declaration under Section 564(b)(1) of the Act, 21 U.S.C. section 360bbb-3(b)(1), unless the authorization is terminated or revoked sooner.    Influenza A by PCR NEGATIVE NEGATIVE Final   Influenza B by PCR NEGATIVE NEGATIVE Final    Comment: (NOTE) The Xpert Xpress SARS-CoV-2/FLU/RSV assay is intended as an aid in  the diagnosis of  influenza from Nasopharyngeal swab specimens and  should not be used as a sole basis for treatment. Nasal washings and  aspirates are unacceptable for Xpert Xpress SARS-CoV-2/FLU/RSV  testing. Fact Sheet for Patients: PinkCheek.be Fact Sheet for Healthcare Providers: GravelBags.it This test is not yet approved or cleared by the [place] FDA and  has been authorized for detection and/or diagnosis of SARS-CoV-2 by  FDA under an Emergency Use Authorization (EUA). This EUA will remain  in effect (meaning this test can be used) for the duration of the  Covid-19 declaration under Section 564(b)(1) of the Act, 21  U.S.C. section 360bbb-3(b)(1), unless the authorization is  terminated or revoked. Performed at [facility], [place] [address], [place], [place] [address]          Radiology Studies: CT ABDOMEN PELVIS W CONTRAST  Result Date: 06/28/2019 CLINICAL DATA:  Right lower quadrant pain EXAM: CT ABDOMEN AND PELVIS WITH CONTRAST TECHNIQUE: Multidetector CT imaging of the abdomen and pelvis was performed using the standard protocol following bolus administration of intravenous contrast. CONTRAST:  127m OMNIPAQUE IOHEXOL 300 MG/ML  SOLN COMPARISON:  Ultrasound 06/28/2019 FINDINGS: Lower chest: Lung bases demonstrate numerous pulmonary nodules measuring up to 8 mm in size concerning for metastatic disease. Heart size within normal limits. Hepatobiliary: Heterogenous liver. Subtle contour nodularity suggesting cirrhosis. No calcified gallstone. Small amount of pericholecystic fluid or wall thickening though normal appearance of the gallbladder by sonography. Large heterogenous mass that appears to be within the posterior right hepatic lobe. This measures approximately 8.2 x 6.9 by 8.8 cm and contains calcifications. The mass may invade the right diaphragm given its appearance on coronal views. Additional smaller mass measuring 2.9 cm,  series 3, image number 28 more inferiorly within the right hepatic lobe. Suspect that there may be additional vague hypodense masses in the right lobe. Pancreas: Unremarkable. No pancreatic ductal dilatation or surrounding inflammatory changes. Spleen: Borderline enlarged. Adrenals/Urinary Tract: Left adrenal gland is normal. Questionable involvement of right adrenal gland by the hepatic mass lesion, series 3, image number 21. Kidneys show no hydronephrosis. The bladder is normal Stomach/Bowel: Stomach nonenlarged. No dilated small bowel. Diverticular disease of the colon. Possible mild wall thickening at the cecum and ascending colon. The appendix is negative. Vascular/Lymphatic: Moderate aortic atherosclerosis. No aneurysm. Small porta hepatis and gastrohepatic nodes measuring up to 16 mm in size, series 3, image number 30. Reproductive: Uterus and bilateral adnexa are unremarkable. Other: No free air. Small amount of ascites within the abdomen and pelvis. Diffuse hazy appearance of the mesentery. Musculoskeletal: Bones appear slightly dense but are without focal lytic or focal sclerotic process. IMPRESSION: 1.  Probable liver cirrhosis. Large heterogeneous suspicion mass measuring at least 8.8 cm, probably arising from the right hepatic lobe of the liver but extending cephalad toward the right lung base with possible violation of the capsule and invasion/involvement of the right diaphragm. At least 1 additional smaller solid mass in the inferior right hepatic lobe either representing metastatic disease or multifocal disease. Correlation with MRI should be considered. 2. Numerous pulmonary nodules consistent with metastatic disease 3. Possible gallbladder wall thickening or small amount of pericholecystic fluid as seen on recent ultrasound 4. Diffuse diverticular disease of the colon with possible wall thickening/mild acute inflammation at the cecum and ascending colon. 5. Small amount of free fluid in the pelvis.  Electronically Signed   By: Donavan Foil M.D.   On: 06/28/2019 23:41   DG Chest Port 1 View  Result Date: 06/28/2019 CLINICAL DATA:  Cough and shortness of breath. EXAM: PORTABLE CHEST 1 VIEW COMPARISON:  Aug 21, 2019 FINDINGS: The cardiac silhouette is enlarged. There is vascular congestion with developing pulmonary edema. There are small bilateral pleural effusions. Aortic calcifications are noted. Bibasilar atelectasis is noted. There is no acute osseous abnormality. IMPRESSION: Findings suggestive of congestive heart failure. Electronically Signed   By: Constance Holster M.D.   On: 06/28/2019 21:43   US Abdomen Limited RUQ  Result Date: 06/28/2019 CLINICAL DATA:  Chest, right upper quadrant pain for 3 weeks, nausea, vomiting and fevers, history of cirrhosis EXAM: ULTRASOUND ABDOMEN LIMITED RIGHT UPPER QUADRANT COMPARISON:  None. FINDINGS: Gallbladder: Mildly contracted appearance of the gallbladder. No visible calcified gallstones, significant gallbladder wall thickening. Trace pericholecystic fluid. Sonographic Percell Miller sign is reportedly negative. Common bile duct: Diameter: 1.3 mm proximally, nondilated. Distal portion not visualized. Liver: There is a heterogeneous, centrally hyperechoic, peripherally hypoechoic lesion with a thin capsule in the right lobe liver measuring 3.9 x 4.0 x 4.6 cm. Coarsened heterogeneous background parenchymal echogenicity with a nodular hepatic surface contour compatible with history of cirrhosis. Portal vein is patent on color Doppler imaging with normal direction of blood flow towards the liver. Other: None. IMPRESSION: Under distension of the gallbladder. Trace pericholecystic fluid is nonspecific with absence of other features of acute cholecystitis. Should be considered within the appropriate clinical setting. Heterogeneous 4.6 cm mass in the posterior right lobe liver, highly concerning for hepatocellular carcinoma given background of cirrhosis. Consider further  characterization with multiphase MRI or CT, preferably on an outpatient basis when patient is better able to tolerate scanning procedure. Electronically Signed   By: Lovena Le M.D.   On: 06/28/2019 22:16        Scheduled Meds: . feeding supplement (ENSURE ENLIVE)  237 mL Oral BID BM   Continuous Infusions: . piperacillin-tazobactam (ZOSYN)  IV       LOS: 0 days    Time spent:40 min    Jamecia Lerman, Geraldo Docker, MD [facility] Pager [phone]  If 7PM-7AM, please contact night-coverage [email or web address] Password [facility] 06/29/2019, 7:35 AM

## 2019-06-29 NOTE — Progress Notes (Signed)
Pharmacy Antibiotic Note  Valerie [name] is a 54 y.o. female admitted on 06/28/2019 with intra-abdominal infection.  Pharmacy has been consulted for Zosyn dosing.  Plan: Zosyn 3.375g IV Q8H (4-hour infusion).  Height: 5\' 2"  (157.5 cm) Weight: 121 lb 1.6 oz (54.9 kg) IBW/kg (Calculated) : 50.1  Temp (24hrs), Avg:99.7 F (37.6 C), Min:98.2 F (36.8 C), Max:101.5 F (38.6 C)  Recent Labs  Lab 06/28/19 2122 06/28/19 2123 06/28/19 2249  WBC  --  5.9  --   CREATININE  --  0.67  --   LATICACIDVEN 1.2  --  0.7    Estimated Creatinine Clearance: 64.3 mL/min (by C-G formula based on SCr of 0.67 mg/dL).    Allergies  Allergen Reactions  . Norco [Hydrocodone-Acetaminophen] Anaphylaxis and Shortness Of Breath  . Tylenol [Acetaminophen] Other (See Comments)    Cannot take due to liver    Thank you for allowing pharmacy to be a part of this patient's care.  Wynona Neat, PharmD, BCPS  06/29/2019 3:55 AM

## 2019-06-29 NOTE — Consult Note (Signed)
Reason for Consult/Chief Complaint: cholecystitis Consultant: Karel Jarvis, PA  Valerie [name] is an 53 y.o. female.   HPI: 15F with cirrhosis 2/2 HepC p/w a three week history of R neck and shoulder pain that progressed to mostly R-sided abdominal pain. She reports presenting repeatedly to the ED/UC for these symptoms. She reports being treated for HepC in 2011. She denies current alcohol use.    Past Medical History:  Diagnosis Date  . Hepatitis C   . Liver cirrhosis ([place])   . Pseudoseizures     Past Surgical History:  Procedure Laterality Date  . CESAREAN SECTION      Family History  Problem Relation Age of Onset  . Heart disease Neg Hx     Social History:  reports that she has been smoking. She has never used smokeless tobacco. She reports that she does not drink alcohol or use drugs.  Allergies:  Allergies  Allergen Reactions  . Norco [Hydrocodone-Acetaminophen] Anaphylaxis and Shortness Of Breath  . Tylenol [Acetaminophen] Other (See Comments)    Cannot take due to liver    Medications: I have reviewed the patient's current medications.  Results for orders placed or performed during the hospital encounter of 06/28/19 (from the past 48 hour(s))  Lactic acid, plasma     Status: None   Collection Time: 06/28/19  9:22 PM  Result Value Ref Range   Lactic Acid, Venous 1.2 0.5 - 1.9 mmol/L    Comment: Performed at [facility], [place] [address] [place], [place] [address]  Comprehensive metabolic panel     Status: Abnormal   Collection Time: 06/28/19  9:23 PM  Result Value Ref Range   Sodium 137 135 - 145 mmol/L   Potassium 4.1 3.5 - 5.1 mmol/L   Chloride 108 98 - 111 mmol/L   CO2 20 (L) 22 - 32 mmol/L   Glucose, Bld 123 (H) 70 - 99 mg/dL    Comment: Glucose reference range applies only to samples taken after fasting for at least 8 hours.   BUN 11 6 - 20 mg/dL   Creatinine, Ser 0.67 0.44 - 1.00 mg/dL   Calcium 8.6 (L) 8.9 - 10.3 mg/dL   Total Protein 7.1 6.5 -  8.1 g/dL   Albumin 2.9 (L) 3.5 - 5.0 g/dL   AST 187 (H) 15 - 41 U/L   ALT 62 (H) 0 - 44 U/L   Alkaline Phosphatase 238 (H) 38 - 126 U/L   Total Bilirubin 1.5 (H) 0.3 - 1.2 mg/dL   GFR calc non Af Amer >60 >60 mL/min   GFR calc Af Amer >60 >60 mL/min   Anion gap 9 5 - 15    Comment: Performed at [facility] [address] [place], [place] [address]  CBC WITH DIFFERENTIAL     Status: Abnormal   Collection Time: 06/28/19  9:23 PM  Result Value Ref Range   WBC 5.9 4.0 - 10.5 K/uL   RBC 3.69 (L) 3.87 - 5.11 MIL/uL   Hemoglobin 11.7 (L) 12.0 - 15.0 g/dL   HCT 34.8 (L) 36.0 - 46.0 %   MCV 94.3 80.0 - 100.0 fL   MCH 31.7 26.0 - 34.0 pg   MCHC 33.6 30.0 - 36.0 g/dL   RDW 15.3 11.5 - 15.5 %   Platelets 108 (L) 150 - 400 K/uL    Comment: REPEATED TO VERIFY PLATELET COUNT CONFIRMED BY SMEAR SPECIMEN CHECKED FOR CLOTS Immature Platelet Fraction may be clinically indicated, consider ordering  this additional test [id number]    nRBC 0.0 0.0 - 0.2 %   Neutrophils Relative % 67 %   Neutro Abs 3.9 1.7 - 7.7 K/uL   Lymphocytes Relative 23 %   Lymphs Abs 1.3 0.7 - 4.0 K/uL   Monocytes Relative 8 %   Monocytes Absolute 0.5 0.1 - 1.0 K/uL   Eosinophils Relative 2 %   Eosinophils Absolute 0.1 0.0 - 0.5 K/uL   Basophils Relative 0 %   Basophils Absolute 0.0 0.0 - 0.1 K/uL   Immature Granulocytes 0 %   Abs Immature Granulocytes 0.02 0.00 - 0.07 K/uL    Comment: Performed at [facility] [address] [place], [place] [address]  APTT     Status: None   Collection Time: 06/28/19  9:23 PM  Result Value Ref Range   aPTT 32 24 - 36 seconds    Comment: Performed at [facility] [address], [place], [place] [address]  Protime-INR     Status: None   Collection Time: 06/28/19  9:23 PM  Result Value Ref Range   Prothrombin Time 14.1 11.4 - 15.2 seconds   INR 1.1 0.8 - 1.2    Comment: (NOTE) INR goal varies based on device and disease states. Performed at [facility], [place] [address] [place], [place] [address]   Lipase, blood     Status: None   Collection Time: 06/28/19  9:23 PM  Result Value Ref Range   Lipase 43 11 - 51 U/L    Comment: Performed at [facility], [place] [address], [place], [place] [address]  Troponin I (High Sensitivity)     Status: None   Collection Time: 06/28/19  9:23 PM  Result Value Ref Range   Troponin I (High Sensitivity) 6 <18 ng/L    Comment: (NOTE) Elevated high sensitivity troponin I (hsTnI) values and significant  changes across serial measurements may suggest ACS but many other  chronic and acute conditions are known to elevate hsTnI results.  Refer to the "Links" section for chest pain algorithms and additional  guidance. Performed at [facility], [place] [address] [place], [place] [address]   I-Stat beta hCG blood, ED     Status: None   Collection Time: 06/28/19  9:30 PM  Result Value Ref Range   I-stat hCG, quantitative <5.0 <5 mIU/mL   Comment 3            Comment:   GEST. AGE      CONC.  (mIU/mL)   <=1 WEEK        5 - 50     2 WEEKS       50 - 500     3 WEEKS       100 - 10,000     4 WEEKS     1,000 - 30,000        FEMALE AND NON-PREGNANT FEMALE:     LESS THAN 5 mIU/mL   Urinalysis, Routine w reflex microscopic     Status: Abnormal   Collection Time: 06/28/19  9:53 PM  Result Value Ref Range   Color, Urine YELLOW YELLOW   APPearance CLEAR CLEAR   Specific Gravity, Urine 1.019 1.005 - 1.030   pH 5.0 5.0 - 8.0   Glucose, UA NEGATIVE NEGATIVE mg/dL   Hgb urine dipstick MODERATE (A) NEGATIVE   Bilirubin Urine NEGATIVE NEGATIVE   Ketones, ur NEGATIVE NEGATIVE mg/dL   Protein, ur  NEGATIVE NEGATIVE mg/dL   Nitrite NEGATIVE NEGATIVE   Leukocytes,Ua TRACE (A) NEGATIVE   RBC / HPF 11-20 0 - 5 RBC/hpf   WBC, UA 6-10 0 - 5 WBC/hpf   Bacteria, UA RARE (A) NONE SEEN   Squamous Epithelial / LPF 0-5 0 - 5   Mucus PRESENT     Comment: Performed at [facility], [place] [address],  [place], [place] [address]  Lactic acid, plasma     Status: None   Collection Time: 06/28/19 10:49 PM  Result Value Ref Range   Lactic Acid, Venous 0.7 0.5 - 1.9 mmol/L    Comment: Performed at [facility] [address] [place], [place] [address]  Troponin I (High Sensitivity)     Status: None   Collection Time: 06/28/19 10:49 PM  Result Value Ref Range   Troponin I (High Sensitivity) 8 <18 ng/L    Comment: (NOTE) Elevated high sensitivity troponin I (hsTnI) values and significant  changes across serial measurements may suggest ACS but many other  chronic and acute conditions are known to elevate hsTnI results.  Refer to the "Links" section for chest pain algorithms and additional  guidance. Performed at [facility], [place] [address], [place], [place] [address]   Respiratory Panel by RT PCR (Flu A&B, Covid) - Nasopharyngeal Swab     Status: None   Collection Time: 06/29/19 12:18 AM   Specimen: Nasopharyngeal Swab  Result Value Ref Range   SARS Coronavirus 2 by RT PCR NEGATIVE NEGATIVE    Comment: (NOTE) SARS-CoV-2 target nucleic acids are NOT DETECTED. The SARS-CoV-2 RNA is generally detectable in upper respiratoy specimens during the acute phase of infection. The lowest concentration of SARS-CoV-2 viral copies this assay can detect is 131 copies/mL. A negative result does not preclude SARS-Cov-2 infection and should not be used as the sole basis for treatment or other patient management decisions. A negative result may occur with  improper specimen collection/handling, submission of specimen other than nasopharyngeal swab, presence of viral mutation(s) within the areas targeted by this assay, and inadequate number of viral copies (<131 copies/mL). A negative result must be combined with clinical observations, patient history, and epidemiological information. The expected result is Negative. Fact Sheet for Patients:  PinkCheek.be Fact  Sheet for Healthcare Providers:  GravelBags.it This test is not yet ap proved or cleared by the [place] FDA and  has been authorized for detection and/or diagnosis of SARS-CoV-2 by FDA under an Emergency Use Authorization (EUA). This EUA will remain  in effect (meaning this test can be used) for the duration of the COVID-19 declaration under Section 564(b)(1) of the Act, 21 U.S.C. section 360bbb-3(b)(1), unless the authorization is terminated or revoked sooner.    Influenza A by PCR NEGATIVE NEGATIVE   Influenza B by PCR NEGATIVE NEGATIVE    Comment: (NOTE) The Xpert Xpress SARS-CoV-2/FLU/RSV assay is intended as an aid in  the diagnosis of influenza from Nasopharyngeal swab specimens and  should not be used as a sole basis for treatment. Nasal washings and  aspirates are unacceptable for Xpert Xpress SARS-CoV-2/FLU/RSV  testing. Fact Sheet for Patients: PinkCheek.be Fact Sheet for Healthcare Providers: GravelBags.it This test is not yet approved or cleared by the [place] FDA and  has been authorized for detection and/or diagnosis of SARS-CoV-2 by  FDA under an Emergency Use Authorization (EUA). This EUA will remain  in effect (meaning this test can be used) for the duration of the  Covid-19 declaration under Section 564(b)(1) of the Act, 21  U.S.C. section 360bbb-3(b)(1), unless the authorization is  terminated or revoked. Performed at [facility], [place] [address] [place], [place] [address]     CT ABDOMEN PELVIS W CONTRAST  Result Date: 06/28/2019 CLINICAL DATA:  Right lower quadrant pain EXAM: CT ABDOMEN AND PELVIS WITH CONTRAST TECHNIQUE: Multidetector CT imaging of the abdomen and pelvis was performed using the standard protocol following bolus administration of intravenous contrast. CONTRAST:  148mL OMNIPAQUE IOHEXOL 300 MG/ML  SOLN COMPARISON:  Ultrasound 06/28/2019  FINDINGS: Lower chest: Lung bases demonstrate numerous pulmonary nodules measuring up to 8 mm in size concerning for metastatic disease. Heart size within normal limits. Hepatobiliary: Heterogenous liver. Subtle contour nodularity suggesting cirrhosis. No calcified gallstone. Small amount of pericholecystic fluid or wall thickening though normal appearance of the gallbladder by sonography. Large heterogenous mass that appears to be within the posterior right hepatic lobe. This measures approximately 8.2 x 6.9 by 8.8 cm and contains calcifications. The mass may invade the right diaphragm given its appearance on coronal views. Additional smaller mass measuring 2.9 cm, series 3, image number 28 more inferiorly within the right hepatic lobe. Suspect that there may be additional vague hypodense masses in the right lobe. Pancreas: Unremarkable. No pancreatic ductal dilatation or surrounding inflammatory changes. Spleen: Borderline enlarged. Adrenals/Urinary Tract: Left adrenal gland is normal. Questionable involvement of right adrenal gland by the hepatic mass lesion, series 3, image number 21. Kidneys show no hydronephrosis. The bladder is normal Stomach/Bowel: Stomach nonenlarged. No dilated small bowel. Diverticular disease of the colon. Possible mild wall thickening at the cecum and ascending colon. The appendix is negative. Vascular/Lymphatic: Moderate aortic atherosclerosis. No aneurysm. Small porta hepatis and gastrohepatic nodes measuring up to 16 mm in size, series 3, image number 30. Reproductive: Uterus and bilateral adnexa are unremarkable. Other: No free air. Small amount of ascites within the abdomen and pelvis. Diffuse hazy appearance of the mesentery. Musculoskeletal: Bones appear slightly dense but are without focal lytic or focal sclerotic process. IMPRESSION: 1. Probable liver cirrhosis. Large heterogeneous suspicion mass measuring at least 8.8 cm, probably arising from the right hepatic lobe of the  liver but extending cephalad toward the right lung base with possible violation of the capsule and invasion/involvement of the right diaphragm. At least 1 additional smaller solid mass in the inferior right hepatic lobe either representing metastatic disease or multifocal disease. Correlation with MRI should be considered. 2. Numerous pulmonary nodules consistent with metastatic disease 3. Possible gallbladder wall thickening or small amount of pericholecystic fluid as seen on recent ultrasound 4. Diffuse diverticular disease of the colon with possible wall thickening/mild acute inflammation at the cecum and ascending colon. 5. Small amount of free fluid in the pelvis. Electronically Signed   By: Donavan Foil M.D.   On: 06/28/2019 23:41   DG Chest Port 1 View  Result Date: 06/28/2019 CLINICAL DATA:  Cough and shortness of breath. EXAM: PORTABLE CHEST 1 VIEW COMPARISON:  Aug 21, 2019 FINDINGS: The cardiac silhouette is enlarged. There is vascular congestion with developing pulmonary edema. There are small bilateral pleural effusions. Aortic calcifications are noted. Bibasilar atelectasis is noted. There is no acute osseous abnormality. IMPRESSION: Findings suggestive of congestive heart failure. Electronically Signed   By: Constance Holster M.D.   On: 06/28/2019 21:43   US Abdomen Limited RUQ  Result Date: 06/28/2019 CLINICAL DATA:  Chest, right upper quadrant pain for 3 weeks, nausea, vomiting and fevers, history of cirrhosis  EXAM: ULTRASOUND ABDOMEN LIMITED RIGHT UPPER QUADRANT COMPARISON:  None. FINDINGS: Gallbladder: Mildly contracted appearance of the gallbladder. No visible calcified gallstones, significant gallbladder wall thickening. Trace pericholecystic fluid. Sonographic Percell Miller sign is reportedly negative. Common bile duct: Diameter: 1.3 mm proximally, nondilated. Distal portion not visualized. Liver: There is a heterogeneous, centrally hyperechoic, peripherally hypoechoic lesion with a thin  capsule in the right lobe liver measuring 3.9 x 4.0 x 4.6 cm. Coarsened heterogeneous background parenchymal echogenicity with a nodular hepatic surface contour compatible with history of cirrhosis. Portal vein is patent on color Doppler imaging with normal direction of blood flow towards the liver. Other: None. IMPRESSION: Under distension of the gallbladder. Trace pericholecystic fluid is nonspecific with absence of other features of acute cholecystitis. Should be considered within the appropriate clinical setting. Heterogeneous 4.6 cm mass in the posterior right lobe liver, highly concerning for hepatocellular carcinoma given background of cirrhosis. Consider further characterization with multiphase MRI or CT, preferably on an outpatient basis when patient is better able to tolerate scanning procedure. Electronically Signed   By: Lovena Le M.D.   On: 06/28/2019 22:16    ROS 10 point review of systems is negative except as listed above in HPI.   Physical Exam Blood pressure (!) 171/88, pulse 86, temperature 98.2 F (36.8 C), temperature source Oral, resp. rate 18, height 5\' 2"  (1.575 m), weight 54.9 kg, SpO2 97 %. Constitutional: well-developed, well-nourished but thin HEENT: pupils equal, round, reactive to light, 7mm b/l, moist conjunctiva, external inspection of ears and nose normal, hearing intact Oropharynx: normal oropharyngeal mucosa, normal dentition Neck: no thyromegaly, trachea midline, no midline cervical tenderness to palpation Chest: breath sounds equal bilaterally, normal respiratory effort, no midline or lateral chest wall tenderness to palpation Abdomen: soft, significant RUQ TTP, no bruising, no hepatosplenomegaly Pelvis: stable GU: normal female genitalia Back: no wounds, no thoracic/lumbar spine tenderness to palpation Rectal: deferred Extremities: 2+ radial and pedal pulses bilaterally, motor and sensation intact to bilateral UE and LE, no peripheral edema MSK: normal  gait/station, no clubbing/cyanosis of fingers/toes, normal ROM of all four extremities Skin: warm, dry, no rashes Psych: normal memory, normal mood/affect    Assessment/Plan: 65F with a history of hepatitis C and cirrhosis and large liver mass on imaging suspicious for [facility]  Biliary findings are most likely 2/2 the liver mass seen on CT that is highly suspicious for hepatocellular carcinoma. Recommend staging with CT chest with contrast and bone scan as well as MRI abdomen to better evaluate additional R hepatic lobe mass(es) not well characterized on CT. Await AFP. Treatment options will be dependent on additional workup. Will have this patient f/u with Dr. Barry Dienes.    Jesusita Oka, MD General and [facility]

## 2019-06-29 NOTE — H&P (Signed)
History and Physical    Valerie [name] HYQ:[id number] DOB: January 11, 1966 DOA: 06/28/2019  PCP: Patient, No Pcp Per  Patient coming from: Home.  Chief Complaint: Abdominal pain nausea vomiting and diarrhea.  HPI: Valerie [name] is a 54 y.o. female with history of liver cirrhosis with hepatitis C and per patient patient was treated with radiation for possible liver cancer in 2004 in [facility] presents to the ER because of persistent abdominal pain nausea vomiting diarrhea over the last 3 weeks with posterior occipital headache radiating to her neck.  Denies any weakness of the upper or lower extremities.  Had come to the ER twice.  Given these worsening symptoms patient comes back to the ER again after following up with the primary care.  Denies any chest pain or shortness of breath or productive cough.  ED Course: In the ER Covid test was negative patient was febrile with temperature of 101.5 tachycardic labs showing hemoglobin of 11.7 platelets 108 LFTs with AST of 187 ALT 62 lactic acid was normal.  Patient had CT abdomen pelvis which shows features concerning for hepatocellular carcinoma along the right hepatic lobe with also possible cecal and ascending colon colitis and also possible metastatic lesions in the lung involving the diaphragm also.  Since there was concern for possible cholecystitis jogging was seen also in the sonogram of the abdomen on-call general surgeon was consulted.  Patient started on empiric antibiotics and admitted for further management.  Review of Systems: As per HPI, rest all negative.   Past Medical History:  Diagnosis Date  . Hepatitis C   . Liver cirrhosis ([place])   . Pseudoseizures     Past Surgical History:  Procedure Laterality Date  . CESAREAN SECTION       reports that she has been smoking. She has never used smokeless tobacco. She reports that she does not drink alcohol or use drugs.  Allergies  Allergen Reactions  . Norco  [Hydrocodone-Acetaminophen] Anaphylaxis and Shortness Of Breath  . Tylenol [Acetaminophen] Other (See Comments)    Cannot take due to liver    Family History  Problem Relation Age of Onset  . Heart disease Neg Hx     Prior to Admission medications   Medication Sig Start Date End Date Taking? Authorizing Provider  diphenoxylate-atropine (LOMOTIL) 2.5-0.025 MG tablet Take 2 tablets by mouth 4 (four) times daily as needed for diarrhea or loose stools. 06/22/19  Yes Upstill, Nehemiah Settle, PA-C  ibuprofen (ADVIL) 600 MG tablet Take 1 tablet (600 mg total) by mouth every 6 (six) hours as needed. 06/22/19  Yes Upstill, Nehemiah Settle, PA-C  methocarbamol (ROBAXIN) 500 MG tablet Take 1 tablet (500 mg total) by mouth 2 (two) times daily. 06/22/19  Yes Upstill, Nehemiah Settle, PA-C  ondansetron (ZOFRAN ODT) 4 MG disintegrating tablet Take 1 tablet (4 mg total) by mouth every 8 (eight) hours as needed for nausea or vomiting. 06/22/19  Yes Upstill, Shari, PA-C  diclofenac Sodium (VOLTAREN) 1 % GEL Apply 2 g topically 4 (four) times daily. Patient not taking: Reported on 06/22/2019 06/09/19   Delia Heady, PA-C  hydrOXYzine (ATARAX/VISTARIL) 25 MG tablet Take 1 tablet (25 mg total) by mouth every 8 (eight) hours as needed for anxiety. Patient not taking: Reported on 06/22/2019 09/02/17   Davonna Belling, MD  methylPREDNISolone (MEDROL DOSEPAK) 4 MG TBPK tablet Taper over 6 days. Patient not taking: Reported on 06/22/2019 06/09/19   Delia Heady, PA-C    Physical Exam: Constitutional: Moderately built and nourished. Vitals:  06/29/19 0100 06/29/19 0115 06/29/19 0130 06/29/19 0306  BP: (!) 159/94 (!) 147/96 (!) 143/79 (!) 171/88  Pulse:    86  Resp: (!) 25 20 17 18   Temp:    98.2 F (36.8 C)  TempSrc:    Oral  SpO2:    97%  Weight:    54.9 kg  Height:       Eyes: Anicteric no pallor. ENMT: No discharge from the ears eyes nose or mouth. Neck: No mass felt.  No neck rigidity. Respiratory: No rhonchi or  crepitations. Cardiovascular: S1-S2 heard. Abdomen: Soft mildly distended no guarding or rigidity.  Tenderness in the epigastric and right upper quadrant. Musculoskeletal: No edema. Skin: No rash. Neurologic: Alert awake oriented to time place and person.  Moves all extremities. Psychiatric: Appears normal per normal affect.   Labs on Admission: I have personally reviewed following labs and imaging studies  CBC: Recent Labs  Lab 06/28/19 2123  WBC 5.9  NEUTROABS 3.9  HGB 11.7*  HCT 34.8*  MCV 94.3  PLT 341*   Basic Metabolic Panel: Recent Labs  Lab 06/28/19 2123  NA 137  K 4.1  CL 108  CO2 20*  GLUCOSE 123*  BUN 11  CREATININE 0.67  CALCIUM 8.6*   GFR: Estimated Creatinine Clearance: 64.3 mL/min (by C-G formula based on SCr of 0.67 mg/dL). Liver Function Tests: Recent Labs  Lab 06/28/19 2123  AST 187*  ALT 62*  ALKPHOS 238*  BILITOT 1.5*  PROT 7.1  ALBUMIN 2.9*   Recent Labs  Lab 06/28/19 2123  LIPASE 43   No results for input(s): AMMONIA in the last 168 hours. Coagulation Profile: Recent Labs  Lab 06/28/19 2123  INR 1.1   Cardiac Enzymes: No results for input(s): CKTOTAL, CKMB, CKMBINDEX, TROPONINI in the last 168 hours. BNP (last 3 results) No results for input(s): PROBNP in the last 8760 hours. HbA1C: No results for input(s): HGBA1C in the last 72 hours. CBG: No results for input(s): GLUCAP in the last 168 hours. Lipid Profile: No results for input(s): CHOL, HDL, LDLCALC, TRIG, CHOLHDL, LDLDIRECT in the last 72 hours. Thyroid Function Tests: No results for input(s): TSH, T4TOTAL, FREET4, T3FREE, THYROIDAB in the last 72 hours. Anemia Panel: No results for input(s): VITAMINB12, FOLATE, FERRITIN, TIBC, IRON, RETICCTPCT in the last 72 hours. Urine analysis:    Component Value Date/Time   COLORURINE YELLOW 06/28/2019 2153   APPEARANCEUR CLEAR 06/28/2019 2153   LABSPEC 1.019 06/28/2019 2153   PHURINE 5.0 06/28/2019 2153   GLUCOSEU  NEGATIVE 06/28/2019 2153   HGBUR MODERATE (A) 06/28/2019 2153   [place] NEGATIVE 06/28/2019 2153   [place] NEGATIVE 06/28/2019 2153   PROTEINUR NEGATIVE 06/28/2019 2153   NITRITE NEGATIVE 06/28/2019 2153   LEUKOCYTESUR TRACE (A) 06/28/2019 2153   Sepsis Labs: @LABRCNTIP (procalcitonin:4,lacticidven:4) ) Recent Results (from the past 240 hour(s))  SARS CORONAVIRUS 2 (TAT 6-24 HRS) Nasopharyngeal Nasopharyngeal Swab     Status: None   Collection Time: 06/22/19  3:16 AM   Specimen: Nasopharyngeal Swab  Result Value Ref Range Status   SARS Coronavirus 2 NEGATIVE NEGATIVE Final    Comment: Performed at [facility], [place] [address], [place], [place] [address]  Respiratory Panel by RT PCR (Flu A&B, Covid) - Nasopharyngeal Swab     Status: None   Collection Time: 06/29/19 12:18 AM   Specimen: Nasopharyngeal Swab  Result Value Ref Range Status   SARS Coronavirus 2 by RT PCR NEGATIVE NEGATIVE Final    Comment: (NOTE) SARS-CoV-2  target nucleic acids are NOT DETECTED. The SARS-CoV-2 RNA is generally detectable in upper respiratoy specimens during the acute phase of infection. The lowest concentration of SARS-CoV-2 viral copies this assay can detect is 131 copies/mL. A negative result does not preclude SARS-Cov-2 infection and should not be used as the sole basis for treatment or other patient management decisions. A negative result may occur with  improper specimen collection/handling, submission of specimen other than nasopharyngeal swab, presence of viral mutation(s) within the areas targeted by this assay, and inadequate number of viral copies (<131 copies/mL). A negative result must be combined with clinical observations, patient history, and epidemiological information. The expected result is Negative. Fact Sheet for Patients:  PinkCheek.be Fact Sheet for Healthcare Providers:  GravelBags.it This test is not yet ap  proved or cleared by the [place] FDA and  has been authorized for detection and/or diagnosis of SARS-CoV-2 by FDA under an Emergency Use Authorization (EUA). This EUA will remain  in effect (meaning this test can be used) for the duration of the COVID-19 declaration under Section 564(b)(1) of the Act, 21 U.S.C. section 360bbb-3(b)(1), unless the authorization is terminated or revoked sooner.    Influenza A by PCR NEGATIVE NEGATIVE Final   Influenza B by PCR NEGATIVE NEGATIVE Final    Comment: (NOTE) The Xpert Xpress SARS-CoV-2/FLU/RSV assay is intended as an aid in  the diagnosis of influenza from Nasopharyngeal swab specimens and  should not be used as a sole basis for treatment. Nasal washings and  aspirates are unacceptable for Xpert Xpress SARS-CoV-2/FLU/RSV  testing. Fact Sheet for Patients: PinkCheek.be Fact Sheet for Healthcare Providers: GravelBags.it This test is not yet approved or cleared by the [place] FDA and  has been authorized for detection and/or diagnosis of SARS-CoV-2 by  FDA under an Emergency Use Authorization (EUA). This EUA will remain  in effect (meaning this test can be used) for the duration of the  Covid-19 declaration under Section 564(b)(1) of the Act, 21  U.S.C. section 360bbb-3(b)(1), unless the authorization is  terminated or revoked. Performed at [facility], [place] [address], [place], [place] [address]      Radiological Exams on Admission: CT ABDOMEN PELVIS W CONTRAST  Result Date: 06/28/2019 CLINICAL DATA:  Right lower quadrant pain EXAM: CT ABDOMEN AND PELVIS WITH CONTRAST TECHNIQUE: Multidetector CT imaging of the abdomen and pelvis was performed using the standard protocol following bolus administration of intravenous contrast. CONTRAST:  155mL OMNIPAQUE IOHEXOL 300 MG/ML  SOLN COMPARISON:  Ultrasound 06/28/2019 FINDINGS: Lower chest: Lung bases demonstrate numerous  pulmonary nodules measuring up to 8 mm in size concerning for metastatic disease. Heart size within normal limits. Hepatobiliary: Heterogenous liver. Subtle contour nodularity suggesting cirrhosis. No calcified gallstone. Small amount of pericholecystic fluid or wall thickening though normal appearance of the gallbladder by sonography. Large heterogenous mass that appears to be within the posterior right hepatic lobe. This measures approximately 8.2 x 6.9 by 8.8 cm and contains calcifications. The mass may invade the right diaphragm given its appearance on coronal views. Additional smaller mass measuring 2.9 cm, series 3, image number 28 more inferiorly within the right hepatic lobe. Suspect that there may be additional vague hypodense masses in the right lobe. Pancreas: Unremarkable. No pancreatic ductal dilatation or surrounding inflammatory changes. Spleen: Borderline enlarged. Adrenals/Urinary Tract: Left adrenal gland is normal. Questionable involvement of right adrenal gland by the hepatic mass lesion, series 3, image number 21. Kidneys show no hydronephrosis. The bladder is normal Stomach/Bowel: Stomach  nonenlarged. No dilated small bowel. Diverticular disease of the colon. Possible mild wall thickening at the cecum and ascending colon. The appendix is negative. Vascular/Lymphatic: Moderate aortic atherosclerosis. No aneurysm. Small porta hepatis and gastrohepatic nodes measuring up to 16 mm in size, series 3, image number 30. Reproductive: Uterus and bilateral adnexa are unremarkable. Other: No free air. Small amount of ascites within the abdomen and pelvis. Diffuse hazy appearance of the mesentery. Musculoskeletal: Bones appear slightly dense but are without focal lytic or focal sclerotic process. IMPRESSION: 1. Probable liver cirrhosis. Large heterogeneous suspicion mass measuring at least 8.8 cm, probably arising from the right hepatic lobe of the liver but extending cephalad toward the right lung base  with possible violation of the capsule and invasion/involvement of the right diaphragm. At least 1 additional smaller solid mass in the inferior right hepatic lobe either representing metastatic disease or multifocal disease. Correlation with MRI should be considered. 2. Numerous pulmonary nodules consistent with metastatic disease 3. Possible gallbladder wall thickening or small amount of pericholecystic fluid as seen on recent ultrasound 4. Diffuse diverticular disease of the colon with possible wall thickening/mild acute inflammation at the cecum and ascending colon. 5. Small amount of free fluid in the pelvis. Electronically Signed   By: Donavan Foil M.D.   On: 06/28/2019 23:41   DG Chest Port 1 View  Result Date: 06/28/2019 CLINICAL DATA:  Cough and shortness of breath. EXAM: PORTABLE CHEST 1 VIEW COMPARISON:  Aug 21, 2019 FINDINGS: The cardiac silhouette is enlarged. There is vascular congestion with developing pulmonary edema. There are small bilateral pleural effusions. Aortic calcifications are noted. Bibasilar atelectasis is noted. There is no acute osseous abnormality. IMPRESSION: Findings suggestive of congestive heart failure. Electronically Signed   By: Constance Holster M.D.   On: 06/28/2019 21:43   US Abdomen Limited RUQ  Result Date: 06/28/2019 CLINICAL DATA:  Chest, right upper quadrant pain for 3 weeks, nausea, vomiting and fevers, history of cirrhosis EXAM: ULTRASOUND ABDOMEN LIMITED RIGHT UPPER QUADRANT COMPARISON:  None. FINDINGS: Gallbladder: Mildly contracted appearance of the gallbladder. No visible calcified gallstones, significant gallbladder wall thickening. Trace pericholecystic fluid. Sonographic Percell Miller sign is reportedly negative. Common bile duct: Diameter: 1.3 mm proximally, nondilated. Distal portion not visualized. Liver: There is a heterogeneous, centrally hyperechoic, peripherally hypoechoic lesion with a thin capsule in the right lobe liver measuring 3.9 x 4.0 x 4.6  cm. Coarsened heterogeneous background parenchymal echogenicity with a nodular hepatic surface contour compatible with history of cirrhosis. Portal vein is patent on color Doppler imaging with normal direction of blood flow towards the liver. Other: None. IMPRESSION: Under distension of the gallbladder. Trace pericholecystic fluid is nonspecific with absence of other features of acute cholecystitis. Should be considered within the appropriate clinical setting. Heterogeneous 4.6 cm mass in the posterior right lobe liver, highly concerning for hepatocellular carcinoma given background of cirrhosis. Consider further characterization with multiphase MRI or CT, preferably on an outpatient basis when patient is better able to tolerate scanning procedure. Electronically Signed   By: Lovena Le M.D.   On: 06/28/2019 22:16    EKG: Independently reviewed.  Sinus tachycardia.  Assessment/Plan Principal Problem:   SIRS (systemic inflammatory response syndrome) (HCC) Active Problems:   Thrombocytopenia (HCC)   Liver cirrhosis (HCC)   Elevated liver enzymes   Fever   Abdominal pain    1. SIRS likely source could be intra-abdominal.  Concerning for acute cholecystitis for which general surgery has been consulted.  We will keep patient n.p.o.  and on antibiotics. 2. Abdominal pain with nausea vomiting and diarrhea.  Abdominal pain could be from hepatic lesions of possible cholecystitis. 3. Possible hepatocellular carcinoma with possible mets to the lung and diaphragm seen in the CAT scan of the abdomen.  We will check AFP and will need GI input/oncology input 4. Headache with radiation to the neck.  We will get a CT head. 5. History of liver cirrhosis with hepatitis C.  LFTs are mildly elevated.  Closely monitor.  Presently on antibiotics. 6. Thrombocytopenia could be from liver cirrhosis.  Appears to be chronic. 7. Anemia appears to be chronic follow CBC. 8. Elevated blood pressure reading will closely  monitor blood pressure trends and as needed IV hydralazine for now.  Given SIRS picture with fever abdominal pain and possibility of hepatocellular carcinoma will need close monitoring for any further deterioration in inpatient status.   DVT prophylaxis: SCDs for now.  Avoiding anticoagulation secondary to possible need of procedure. Code Status: Full code. Family Communication: Patient's daughter. Disposition Plan: To be determined. Consults called: General surgery. Admission status: Inpatient.   Rise Patience MD [facility] Pager [phone].  If 7PM-7AM, please contact night-coverage [email or web address] Password [facility]  06/29/2019, 3:48 AM

## 2019-06-30 ENCOUNTER — Inpatient Hospital Stay (HOSPITAL_COMMUNITY): Payer: Medicaid Other

## 2019-06-30 DIAGNOSIS — C220 Liver cell carcinoma: Secondary | ICD-10-CM

## 2019-06-30 DIAGNOSIS — I361 Nonrheumatic tricuspid (valve) insufficiency: Secondary | ICD-10-CM

## 2019-06-30 DIAGNOSIS — I371 Nonrheumatic pulmonary valve insufficiency: Secondary | ICD-10-CM

## 2019-06-30 DIAGNOSIS — C7801 Secondary malignant neoplasm of right lung: Secondary | ICD-10-CM

## 2019-06-30 LAB — COMPREHENSIVE METABOLIC PANEL
ALT: 56 U/L — ABNORMAL HIGH (ref 0–44)
AST: 149 U/L — ABNORMAL HIGH (ref 15–41)
Albumin: 2.6 g/dL — ABNORMAL LOW (ref 3.5–5.0)
Alkaline Phosphatase: 193 U/L — ABNORMAL HIGH (ref 38–126)
Anion gap: 10 (ref 5–15)
BUN: 6 mg/dL (ref 6–20)
CO2: 22 mmol/L (ref 22–32)
Calcium: 8.6 mg/dL — ABNORMAL LOW (ref 8.9–10.3)
Chloride: 103 mmol/L (ref 98–111)
Creatinine, Ser: 0.65 mg/dL (ref 0.44–1.00)
GFR calc Af Amer: >60 mL/min (ref >60)
GFR calc non Af Amer: >60 mL/min (ref >60)
Glucose, Bld: 96 mg/dL (ref 70–99)
Potassium: 4 mmol/L (ref 3.5–5.1)
Sodium: 135 mmol/L (ref 135–145)
Total Bilirubin: 1.9 mg/dL — ABNORMAL HIGH (ref 0.3–1.2)
Total Protein: 6.7 g/dL (ref 6.5–8.1)

## 2019-06-30 LAB — HCV RNA QUANT
HCV Quantitative Log: 5.802 log10 IU/mL (ref >1.70)
HCV Quantitative: 634000 IU/mL (ref >50)

## 2019-06-30 LAB — CBC WITH DIFFERENTIAL/PLATELET
Abs Immature Granulocytes: 0.02 10*3/uL (ref 0.00–0.07)
Basophils Absolute: 0 10*3/uL (ref 0.0–0.1)
Basophils Relative: 0 %
Eosinophils Absolute: 0.1 10*3/uL (ref 0.0–0.5)
Eosinophils Relative: 1 %
HCT: 33.9 % — ABNORMAL LOW (ref 36.0–46.0)
Hemoglobin: 11.4 g/dL — ABNORMAL LOW (ref 12.0–15.0)
Immature Granulocytes: 0 %
Lymphocytes Relative: 25 %
Lymphs Abs: 1.9 10*3/uL (ref 0.7–4.0)
MCH: 31.3 pg (ref 26.0–34.0)
MCHC: 33.6 g/dL (ref 30.0–36.0)
MCV: 93.1 fL (ref 80.0–100.0)
Monocytes Absolute: 0.8 10*3/uL (ref 0.1–1.0)
Monocytes Relative: 11 %
Neutro Abs: 4.8 10*3/uL (ref 1.7–7.7)
Neutrophils Relative %: 63 %
Platelets: 114 10*3/uL — ABNORMAL LOW (ref 150–400)
RBC: 3.64 MIL/uL — ABNORMAL LOW (ref 3.87–5.11)
RDW: 15.2 % (ref 11.5–15.5)
WBC: 7.6 10*3/uL (ref 4.0–10.5)
nRBC: 0 % (ref 0.0–0.2)

## 2019-06-30 LAB — ECHOCARDIOGRAM COMPLETE
Height: 62 in
Weight: 2009.6 oz

## 2019-06-30 LAB — HEPATITIS PANEL, ACUTE
HCV Ab: REACTIVE — AB
Hep A IgM: NONREACTIVE
Hep B C IgM: UNDETERMINED — AB
Hepatitis B Surface Ag: NONREACTIVE

## 2019-06-30 LAB — URINE CULTURE: Culture: 10000 — AB

## 2019-06-30 LAB — PHOSPHORUS: Phosphorus: 3.1 mg/dL (ref 2.5–4.6)

## 2019-06-30 LAB — MAGNESIUM: Magnesium: 1.7 mg/dL (ref 1.7–2.4)

## 2019-06-30 LAB — HEPARIN LEVEL (UNFRACTIONATED): Heparin Unfractionated: 0.19 IU/mL — ABNORMAL LOW (ref 0.30–0.70)

## 2019-06-30 LAB — PROCALCITONIN: Procalcitonin: 0.37 ng/mL

## 2019-06-30 MED ORDER — METHOCARBAMOL 1000 MG/10ML IJ SOLN
500.0000 mg | Freq: Three times a day (TID) | INTRAVENOUS | Status: DC | PRN
  Filled 2019-06-30: qty 5

## 2019-06-30 MED ORDER — FENTANYL CITRATE (PF) 100 MCG/2ML IJ SOLN
50.0000 ug | Freq: Once | INTRAMUSCULAR | Status: AC
  Administered 2019-06-30: 50 ug via INTRAVENOUS
  Filled 2019-06-30: qty 2

## 2019-06-30 MED ORDER — HEPARIN (PORCINE) 25000 UT/250ML-% IV SOLN
1150.0000 [IU]/h | INTRAVENOUS | Status: DC
  Administered 2019-06-30: 1000 [IU]/h via INTRAVENOUS
  Administered 2019-07-01: 1150 [IU]/h via INTRAVENOUS
  Filled 2019-06-30 (×2): qty 250

## 2019-06-30 MED ORDER — DEXTROSE-NACL 5-0.9 % IV SOLN: INTRAVENOUS | Status: DC

## 2019-06-30 MED ORDER — LORAZEPAM 2 MG/ML IJ SOLN
0.5000 mg | Freq: Once | INTRAMUSCULAR | Status: AC
  Administered 2019-06-30: 0.5 mg via INTRAVENOUS
  Filled 2019-06-30: qty 1

## 2019-06-30 MED ORDER — FENTANYL CITRATE (PF) 100 MCG/2ML IJ SOLN
25.0000 ug | INTRAMUSCULAR | Status: DC | PRN
  Administered 2019-06-30 – 2019-07-02 (×7): 25 ug via INTRAVENOUS
  Filled 2019-06-30 (×7): qty 2

## 2019-06-30 NOTE — Progress Notes (Signed)
ANTICOAGULATION CONSULT NOTE - Initial Consult  Pharmacy Consult for: Heparin Indication:  VTE Treatment (Tumor thrombus throughout the hepatic system)   Allergies  Allergen Reactions  . Norco [Hydrocodone-Acetaminophen] Anaphylaxis and Shortness Of Breath  . Tylenol [Acetaminophen] Other (See Comments)    Cannot take due to liver    Patient Measurements: Height: 5\' 2"  (157.5 cm) Weight: 125 lb 9.6 oz (57 kg) IBW/kg (Calculated) : 50.1 Heparin Dosing Weight: 58.5  Vital Signs: Temp: 98.6 F (37 C) (03/21 1200) Temp Source: Oral (03/21 1200) BP: 137/88 (03/21 1200) Pulse Rate: 72 (03/21 1200)  Labs: Recent Labs    06/28/19 2123 06/28/19 2123 06/28/19 2249 06/29/19 0418 06/29/19 1343 06/30/19 0412  HGB 11.7*   < >  --  10.6*  --  11.4*  HCT 34.8*  --   --  31.2*  --  33.9*  PLT 108*  --   --  97*  --  114*  APTT 32  --   --   --  32  --   LABPROT 14.1  --   --   --  14.7  --   INR 1.1  --   --   --  1.2  --   CREATININE 0.67  --   --  0.65  --  0.65  TROPONINIHS 6  --  8  --   --   --    < > = values in this interval not displayed.    Estimated Creatinine Clearance: 64.3 mL/min (by C-G formula based on SCr of 0.65 mg/dL).   Medical History: Past Medical History:  Diagnosis Date  . Hepatitis C   . Liver cirrhosis ([place])   . Pseudoseizures     Medications:  Scheduled:  . feeding supplement (ENSURE ENLIVE)  237 mL Oral BID BM  . metoprolol tartrate  5 mg Intravenous Q6H   Infusions:  . sodium chloride 75 mL/hr at 06/30/19 0152  . methocarbamol (ROBAXIN) IV      Assessment: Pt is a 54 y/o female with PMH of liver cirrhosis with hepatitis C who presented with abdominal pian, nausea, vomiting, and diarrhea. CT abdomen pelvix shows features concerning for hepatocellular carcinoma along the right hepatic lobe. MRI abdomen is suggestive of extensive tumor thrombus in the portal venous system of the right lobe of the liver. Phramacy has been consulted to dose  heparin.  Patient's Plts are chronically low around 114. Hg/Hct are stable at 11.4/33.9. Patient is not on anticoag PTA.  Goal of Therapy:  Heparin level 0.3-0.7 units/ml Monitor platelets by anticoagulation protocol: Yes   Plan:  No bolus given patient's low platelets Start heparin infusion at 1000 units/hr Check anti-Xa level in 6 hours and daily while on heparin Continue to monitor H&H and platelets  Sherren Kerns, PharmD PGY1 [facility] Resident 06/30/2019,2:13 PM

## 2019-06-30 NOTE — Progress Notes (Signed)
PROGRESS NOTE    Valerie [name]  WER:[id number] DOB: 1965-10-11 DOA: 06/28/2019 PCP: Patient, No Pcp Per     Brief Narrative:  Valerie [name] is a 54 y.o. BF PMHx Hx  liver cirrhosis with hepatitis C and per patient patient was treated with radiation for possible liver cancer in 2004 in [place] to the ER because of persistent abdominal pain nausea vomiting diarrhea over the last 3 weeks with posterior occipital headache radiating to her neck.  Denies any weakness of the upper or lower extremities.  Had come to the ER twice.  Given these worsening symptoms patient comes back to the ER again after following up with the primary care.  Denies any chest pain or shortness of breath or productive cough.  ED Course: In the ER Covid test was negative patient was febrile with temperature of 101.5 tachycardic labs showing hemoglobin of 11.7 platelets 108 LFTs with AST of 187 ALT 62 lactic acid was normal.  Patient had CT abdomen pelvis which shows features concerning for hepatocellular carcinoma along the right hepatic lobe with also possible cecal and ascending colon colitis and also possible metastatic lesions in the lung involving the diaphragm also.  Since there was concern for possible cholecystitis jogging was seen also in the sonogram of the abdomen on-call general surgeon was consulted.  Patient started on empiric antibiotics and admitted for further management.   Subjective: 3/21 A/O x4, positive RUQ abdominal pain.  Positive right paracervical/occipital pain   Assessment & Plan:   Principal Problem:   SIRS (systemic inflammatory response syndrome) (HCC) Active Problems:   Thrombocytopenia (HCC)   Liver cirrhosis (HCC)   Elevated liver enzymes   Fever   Abdominal pain  SIRS/RUQ pain -Patient started on antibiotics for cholecystitis although patient has elevated alk phos and AST/ALT do not believe secondary to infection however given patient's tenuous condition  will continue empiric coverage. -Most likely secondary to metastatic [place] -3/21 D5-0.9% saline 70m/hr -3/21 DC antibiotics patient's symptoms not consistent with cholecystitis.  Hepatitis B? -Hepatitis B core antibody equivocal -GI oncology to see patient on 3/22 will determine appropriate next step for determining if false positive.  Liver cirrhosis positive current Hepatitis C -Hepatitis C RNA quant positive 634K, indicating positive hepatitis C infection  Metastatic Hepatocellular carcinoma -Multiphasic MRI abdomen liver protocol consistent with hepatocellular carcinoma metastatic see results below -AFP pending -3/21 discussed case with Oncology Dr. [facility]ich[facility] to determine future course of action.  CHF/pulmonary HTN? -Echogram ruled out CHF see results below  Hypokalemia -Potassium goal> 4  Hypomagnesmia -Magnesium goal> 2 -3/21 magnesium IV 2 g  Chronic anemia?  (Baseline HgB= 12) -Anemia panel pending -3/20 PT/INR 1.2 -Occult blood pending  Chronic thrombocytopenia (platelet baseline 100) -Given patient's chronically low platelets would hold all heparin products.  Pain control -Tramadol PRN -Fentanyl IV PRN -Robaxin PRN  Essential HTN -Metoprolol IV 5 mg QID -Hydralazine PRN  Goals of care -3/21 Palliative Care; patient with extensive metastatic [place](stage IV?)  Patient and daughter's request information on long-term care issues such as changing CODE STATUS, appointing HCPOA,    DVT prophylaxis: Heparin drip Code Status: DNR Family Communication: 3/21 spoke with both daughters Ellaricka and LaLa counseled on plan of care answered all questions Disposition Plan: TBD 1.  Where the patient is from 2.  Anticipated d/c place. 3.  Barriers to d/c completion cancer work-up    Consultants:  Oncology Dr.  Nicholas Lose    Procedures/Significant Events:  3/19 PCXR; findings suggestive of CHF 3/19 RUQ  abdominal ultrasound;-under distension of the gallbladder. -Trace pericholecystic fluid is nonspecific with absence of other features of acute cholecystitis. Should be considered within the appropriate clinical setting. -Heterogeneous 4.6 cm mass in the posterior right lobe liver, highly concerning for hepatocellular carcinoma given background of cirrhosis. Consider further characterization with multiphase MRI or CT, preferably on an outpatient basis when patient is better able to tolerate scanning procedure. 3/19 CT abdomen pelvis W contrast;-probable liver cirrhosis. Large heterogeneous suspicion mass measuring at least 8.8 cm, probably arising from the right hepatic lobe of the liver but extending cephalad toward the right lung base with possible violation of the capsule and invasion/involvement of the right diaphragm. -At least 1 additional smaller solid mass in the inferior right hepatic lobe either representing metastatic disease or multifocal disease. Correlation with MRI should be considered. -Numerous pulmonary nodules consistent with metastatic disease -. Possible gallbladder wall thickening or small amount of pericholecystic fluid as seen on recent ultrasound -Diffuse diverticular disease of the colon with possible wall thickening/mild acute inflammation at the cecum and ascending colon. 3/20 CT head W. Wo contrast; negative intracranial abnormality 3/21 Echocardiogram;LVEF= 60 to 65%.-No regional wall motion abnormalities.  -Left ventricular diastolic function could not be evaluated. 3/20 MRI abdomen W/W. Wo contrast;Cirrhosis with probable diffusely infiltrative hepatocellular carcinoma throughout the right lobe of the liver, including a more well encapsulated area near the dome. This appears to extend out of the hepatic capsule invading the right hemidiaphragm which is enlarged, and potentially invading the lower right hemithorax with possible involvement of the inferior aspect of  the right lower lobe.  -Trace right pleural effusion is likely malignant. -Tumor thrombus involving the right portal and hepatic venous systems, as above. -Multiple small pulmonary nodules scattered throughout the visualize lung bases compatible with widespread metastatic disease to the lungs, better demonstrated on chest CT 06/30/2019. -Moderate dilatation of the gallbladder with diffuse gallbladder wall edema. No gallstones and there is no pericholecystic fluid. This likely reflects gallbladder wall edema from intrinsic hepatic disease, and is not favored to indicate an acute cholecystitis    -Multiple distal esophageal and proximal gastric varices. 3/21 CT chest W. Wo contrast:-Multiple pulmonary nodules highly concerning for widespread metastatic disease to the lungs. -Large right lobe of the liver mass with trans diaphragmatic invasion into the lower right hemithorax involving portions of the  right lower lobe. -Aortic atherosclerosis, in addition to left main and left anterior descending coronary artery disease.    I have personally reviewed and interpreted all radiology studies and my findings are as above.  VENTILATOR SETTINGS:    Cultures 3/20 Hepatitis C RNA quant positive 634K 3/20 positive equivocal hepatitis B core antibody 3/20 SARS coronavirus negative 3/20 influenza A/B negative   Antimicrobials: Anti-infectives (From admission, onward)   Start     Dose/Rate Stop   06/29/19 1000  piperacillin-tazobactam (ZOSYN) IVPB 3.375 g  Status:  Discontinued     3.375 g 12.5 mL/hr over 240 Minutes 06/30/19 1354   06/29/19 0400  piperacillin-tazobactam (ZOSYN) IVPB 3.375 g     3.375 g 100 mL/hr over 30 Minutes 06/29/19 0537   06/28/19 2245  metroNIDAZOLE (FLAGYL) IVPB 500 mg     500 mg 100 mL/hr over 60 Minutes 06/29/19 0022   06/28/19 2115  cefTRIAXone (ROCEPHIN) 2 g in sodium chloride 0.9 % 100 mL IVPB     2 g 200 mL/hr over 30 Minutes  06/28/19 2141        Devices    LINES / TUBES:      Continuous Infusions: . sodium chloride 75 mL/hr at 06/30/19 0152  . piperacillin-tazobactam (ZOSYN)  IV 3.375 g (06/30/19 0150)     Objective: Vitals:   06/29/19 1244 06/29/19 1711 06/29/19 2054 06/30/19 0529  BP: (!) 163/102 (!) 163/92 (!) 174/100 (!) 147/95  Pulse: 83 80 85 71  Resp:  _0 Temp: 98.5 F (36.9 C) 98.7 F (37.1 C) 99.6 F (37.6 C) 98.1 F (36.7 C)  TempSrc: Oral Oral Oral Oral  SpO2: 100% 95% 99% 98%  Weight:    57 kg  Height:        Intake/Output Summary (Last 24 hours) at 06/30/2019 6384 Last data filed at 06/29/2019 2000 Gross per 24 hour  Intake 1044.64 ml  Output --  Net 1044.64 ml   Filed Weights   06/28/19 2025 06/29/19 0306 06/30/19 0529  Weight: 58.5 kg 54.9 kg 57 kg   Physical Exam:  General: A/O x4, no acute respiratory distress Eyes: negative scleral hemorrhage, negative anisocoria, negative icterus ENT: Negative Runny nose, negative gingival bleeding, Neck:  Negative scars, masses, torticollis, lymphadenopathy, JVD, positive RIGHT paracervical/occipital pain to palpation Lungs: Clear to auscultation bilaterally without wheezes or crackles Cardiovascular: Regular rate and rhythm without murmur gallop or rub normal S1 and S2 Abdomen: Positive abdominal pain RUQ>> remainder of abdomen, nondistended, positive soft, bowel sounds, no rebound, no ascites, no appreciable mass Extremities: No significant cyanosis, clubbing, or edema bilateral lower extremities Skin: Negative rashes, lesions, ulcers Psychiatric:  Negative depression, negative anxiety, negative fatigue, negative mania  Central nervous system:  Cranial nerves II through XII intact, tongue/uvula midline, all extremities muscle strength 5/5, sensation intact throughout, negative dysarthria, negative expressive aphasia, negative receptive aphasia.      Data Reviewed: Care during the described time interval was provided by me .  I have  reviewed this patient's available data, including medical history, events of note, physical examination, and all test results as part of my evaluation.  CBC: Recent Labs  Lab 06/28/19 2123 06/29/19 0418 06/30/19 0412  WBC 5.9 6.0 7.6  NEUTROABS 3.9 3.3 4.8  HGB 11.7* 10.6* 11.4*  HCT 34.8* 31.2* 33.9*  MCV 94.3 94.3 93.1  PLT 108* 97* 536*   Basic Metabolic Panel: Recent Labs  Lab 06/28/19 2123 06/29/19 0418 06/30/19 0412  NA 137 137 135  K 4.1 3.2* 4.0  CL 108 106 103  CO2 20* 21* 22  GLUCOSE 123* 128* 96  BUN _1 CREATININE 0.67 0.65 0.65  CALCIUM 8.6* 8.2* 8.6*  MG  --   --  1.7  PHOS  --   --  3.1   GFR: Estimated Creatinine Clearance: 64.3 mL/min (by C-G formula based on SCr of 0.65 mg/dL). Liver Function Tests: Recent Labs  Lab 06/28/19 2123 06/29/19 0418 06/30/19 0412  AST 187* 151* 149*  ALT 62* 53* 56*  ALKPHOS 238* 207* 193*  BILITOT 1.5* 1.1 1.9*  PROT 7.1 6.1* 6.7  ALBUMIN 2.9* 2.5* 2.6*   Recent Labs  Lab 06/28/19 2123  LIPASE 43   No results for input(s): AMMONIA in the last 168 hours. Coagulation Profile: Recent Labs  Lab 06/28/19 2123 06/29/19 1343  INR 1.1 1.2   Cardiac Enzymes: No results for input(s): CKTOTAL, CKMB, CKMBINDEX, TROPONINI in the last 168 hours. BNP (last 3 results) No results for input(s): PROBNP in the last 8760 hours. HbA1C:  No results for input(s): HGBA1C in the last 72 hours. CBG: No results for input(s): GLUCAP in the last 168 hours. Lipid Profile: No results for input(s): CHOL, HDL, LDLCALC, TRIG, CHOLHDL, LDLDIRECT in the last 72 hours. Thyroid Function Tests: No results for input(s): TSH, T4TOTAL, FREET4, T3FREE, THYROIDAB in the last 72 hours. Anemia Panel: Recent Labs    06/29/19 1343  VITAMINB12 2,620*  FOLATE 7.2  FERRITIN 745*  TIBC 241*  IRON 52  RETICCTPCT 2.0   Sepsis Labs: Recent Labs  Lab 06/28/19 2122 06/28/19 2249 06/29/19 0849 06/29/19 1032 06/30/19 0412  PROCALCITON   --   --  0.37  --  0.37  LATICACIDVEN 1.2 0.7 1.1 0.8  --     Recent Results (from the past 240 hour(s))  SARS CORONAVIRUS 2 (TAT 6-24 HRS) Nasopharyngeal Nasopharyngeal Swab     Status: None   Collection Time: 06/22/19  3:16 AM   Specimen: Nasopharyngeal Swab  Result Value Ref Range Status   SARS Coronavirus 2 NEGATIVE NEGATIVE Final    Comment: Performed at [facility], [place] [address], [place], [place] [address]  Blood Culture (routine x 2)     Status: None (Preliminary result)   Collection Time: 06/28/19  9:28 PM   Specimen: BLOOD  Result Value Ref Range Status   Specimen Description BLOOD SITE NOT SPECIFIED  Final   Special Requests   Final    BOTTLES DRAWN AEROBIC AND ANAEROBIC Blood Culture results may not be optimal due to an excessive volume of blood received in culture bottles   Culture   Final    NO GROWTH 2 DAYS Performed at [facility], [place] [address], [place], [place] [address]    Report Status PENDING  Incomplete  Blood Culture (routine x 2)     Status: None (Preliminary result)   Collection Time: 06/28/19  9:28 PM   Specimen: BLOOD  Result Value Ref Range Status   Specimen Description BLOOD SITE NOT SPECIFIED  Final   Special Requests   Final    BOTTLES DRAWN AEROBIC AND ANAEROBIC Blood Culture results may not be optimal due to an excessive volume of blood received in culture bottles   Culture   Final    NO GROWTH 2 DAYS Performed at [facility], [place] [address] [place], [place] [address]    Report Status PENDING  Incomplete  Respiratory Panel by RT PCR (Flu A&B, Covid) - Nasopharyngeal Swab     Status: None   Collection Time: 06/29/19 12:18 AM   Specimen: Nasopharyngeal Swab  Result Value Ref Range Status   SARS Coronavirus 2 by RT PCR NEGATIVE NEGATIVE Final    Comment: (NOTE) SARS-CoV-2 target nucleic acids are NOT DETECTED. The SARS-CoV-2 RNA is generally detectable in upper respiratoy specimens during the acute phase of infection.  The lowest concentration of SARS-CoV-2 viral copies this assay can detect is 131 copies/mL. A negative result does not preclude SARS-Cov-2 infection and should not be used as the sole basis for treatment or other patient management decisions. A negative result may occur with  improper specimen collection/handling, submission of specimen other than nasopharyngeal swab, presence of viral mutation(s) within the areas targeted by this assay, and inadequate number of viral copies (<131 copies/mL). A negative result must be combined with clinical observations, patient history, and epidemiological information. The expected result is Negative. Fact Sheet for Patients:  PinkCheek.be Fact Sheet for Healthcare Providers:  GravelBags.it This test is not yet ap proved  or cleared by the [place] and  has been authorized for detection and/or diagnosis of SARS-CoV-2 by FDA under an Emergency Use Authorization (EUA). This EUA will remain  in effect (meaning this test can be used) for the duration of the COVID-19 declaration under Section 564(b)(1) of the Act, 21 U.S.C. section 360bbb-3(b)(1), unless the authorization is terminated or revoked sooner.    Influenza A by PCR NEGATIVE NEGATIVE Final   Influenza B by PCR NEGATIVE NEGATIVE Final    Comment: (NOTE) The Xpert Xpress SARS-CoV-2/FLU/RSV assay is intended as an aid in  the diagnosis of influenza from Nasopharyngeal swab specimens and  should not be used as a sole basis for treatment. Nasal washings and  aspirates are unacceptable for Xpert Xpress SARS-CoV-2/FLU/RSV  testing. Fact Sheet for Patients: PinkCheek.be Fact Sheet for Healthcare Providers: GravelBags.it This test is not yet approved or cleared by the [place] FDA and  has been authorized for detection and/or diagnosis of SARS-CoV-2 by  FDA under an  Emergency Use Authorization (EUA). This EUA will remain  in effect (meaning this test can be used) for the duration of the  Covid-19 declaration under Section 564(b)(1) of the Act, 21  U.S.C. section 360bbb-3(b)(1), unless the authorization is  terminated or revoked. Performed at [facility], [place] [address] [place], [place] [address]          Radiology Studies: CT HEAD WO CONTRAST  Result Date: 06/29/2019 CLINICAL DATA:  Headache, intracranial hemorrhage suspected. Additional history provided: Diffuse headache with pain radiating into neck ongoing for days. EXAM: CT HEAD WITHOUT CONTRAST TECHNIQUE: Contiguous axial images were obtained from the base of the skull through the vertex without intravenous contrast. COMPARISON:  Head CT 07/03/2017, brain MRI 11/27/2016 FINDINGS: Brain: There is no evidence of acute intracranial hemorrhage, intracranial mass, midline shift or extra-axial fluid collection.No demarcated cortical infarction. Cerebral volume is normal for age. Vascular: No hyperdense vessel.  Atherosclerotic calcifications Skull: Normal. Negative for fracture or focal lesion. Sinuses/Orbits: Visualized orbits demonstrate no acute abnormality. No significant paranasal sinus disease or mastoid effusion at the imaged levels. IMPRESSION: No evidence of acute intracranial abnormality. Specifically, no evidence of acute intracranial hemorrhage. Electronically Signed   By: Kellie Simmering DO   On: 06/29/2019 08:25   CT ABDOMEN PELVIS W CONTRAST  Result Date: 06/28/2019 CLINICAL DATA:  Right lower quadrant pain EXAM: CT ABDOMEN AND PELVIS WITH CONTRAST TECHNIQUE: Multidetector CT imaging of the abdomen and pelvis was performed using the standard protocol following bolus administration of intravenous contrast. CONTRAST:  166m OMNIPAQUE IOHEXOL 300 MG/ML  SOLN COMPARISON:  Ultrasound 06/28/2019 FINDINGS: Lower chest: Lung bases demonstrate numerous pulmonary nodules measuring up to 8 mm in  size concerning for metastatic disease. Heart size within normal limits. Hepatobiliary: Heterogenous liver. Subtle contour nodularity suggesting cirrhosis. No calcified gallstone. Small amount of pericholecystic fluid or wall thickening though normal appearance of the gallbladder by sonography. Large heterogenous mass that appears to be within the posterior right hepatic lobe. This measures approximately 8.2 x 6.9 by 8.8 cm and contains calcifications. The mass may invade the right diaphragm given its appearance on coronal views. Additional smaller mass measuring 2.9 cm, series 3, image number 28 more inferiorly within the right hepatic lobe. Suspect that there may be additional vague hypodense masses in the right lobe. Pancreas: Unremarkable. No pancreatic ductal dilatation or surrounding inflammatory changes. Spleen: Borderline enlarged. Adrenals/Urinary Tract: Left adrenal gland is normal. Questionable involvement of right adrenal gland by the hepatic  mass lesion, series 3, image number 21. Kidneys show no hydronephrosis. The bladder is normal Stomach/Bowel: Stomach nonenlarged. No dilated small bowel. Diverticular disease of the colon. Possible mild wall thickening at the cecum and ascending colon. The appendix is negative. Vascular/Lymphatic: Moderate aortic atherosclerosis. No aneurysm. Small porta hepatis and gastrohepatic nodes measuring up to 16 mm in size, series 3, image number 30. Reproductive: Uterus and bilateral adnexa are unremarkable. Other: No free air. Small amount of ascites within the abdomen and pelvis. Diffuse hazy appearance of the mesentery. Musculoskeletal: Bones appear slightly dense but are without focal lytic or focal sclerotic process. IMPRESSION: 1. Probable liver cirrhosis. Large heterogeneous suspicion mass measuring at least 8.8 cm, probably arising from the right hepatic lobe of the liver but extending cephalad toward the right lung base with possible violation of the capsule and  invasion/involvement of the right diaphragm. At least 1 additional smaller solid mass in the inferior right hepatic lobe either representing metastatic disease or multifocal disease. Correlation with MRI should be considered. 2. Numerous pulmonary nodules consistent with metastatic disease 3. Possible gallbladder wall thickening or small amount of pericholecystic fluid as seen on recent ultrasound 4. Diffuse diverticular disease of the colon with possible wall thickening/mild acute inflammation at the cecum and ascending colon. 5. Small amount of free fluid in the pelvis. Electronically Signed   By: Donavan Foil M.D.   On: 06/28/2019 23:41   DG Chest Port 1 View  Result Date: 06/28/2019 CLINICAL DATA:  Cough and shortness of breath. EXAM: PORTABLE CHEST 1 VIEW COMPARISON:  Aug 21, 2019 FINDINGS: The cardiac silhouette is enlarged. There is vascular congestion with developing pulmonary edema. There are small bilateral pleural effusions. Aortic calcifications are noted. Bibasilar atelectasis is noted. There is no acute osseous abnormality. IMPRESSION: Findings suggestive of congestive heart failure. Electronically Signed   By: Constance Holster M.D.   On: 06/28/2019 21:43   US Abdomen Limited RUQ  Result Date: 06/28/2019 CLINICAL DATA:  Chest, right upper quadrant pain for 3 weeks, nausea, vomiting and fevers, history of cirrhosis EXAM: ULTRASOUND ABDOMEN LIMITED RIGHT UPPER QUADRANT COMPARISON:  None. FINDINGS: Gallbladder: Mildly contracted appearance of the gallbladder. No visible calcified gallstones, significant gallbladder wall thickening. Trace pericholecystic fluid. Sonographic Percell Miller sign is reportedly negative. Common bile duct: Diameter: 1.3 mm proximally, nondilated. Distal portion not visualized. Liver: There is a heterogeneous, centrally hyperechoic, peripherally hypoechoic lesion with a thin capsule in the right lobe liver measuring 3.9 x 4.0 x 4.6 cm. Coarsened heterogeneous background  parenchymal echogenicity with a nodular hepatic surface contour compatible with history of cirrhosis. Portal vein is patent on color Doppler imaging with normal direction of blood flow towards the liver. Other: None. IMPRESSION: Under distension of the gallbladder. Trace pericholecystic fluid is nonspecific with absence of other features of acute cholecystitis. Should be considered within the appropriate clinical setting. Heterogeneous 4.6 cm mass in the posterior right lobe liver, highly concerning for hepatocellular carcinoma given background of cirrhosis. Consider further characterization with multiphase MRI or CT, preferably on an outpatient basis when patient is better able to tolerate scanning procedure. Electronically Signed   By: Lovena Le M.D.   On: 06/28/2019 22:16        Scheduled Meds: . feeding supplement (ENSURE ENLIVE)  237 mL Oral BID BM  . metoprolol tartrate  5 mg Intravenous Q6H   Continuous Infusions: . sodium chloride 75 mL/hr at 06/30/19 0152  . piperacillin-tazobactam (ZOSYN)  IV 3.375 g (06/30/19 0150)  LOS: 1 day    Time spent:40 min    Farha Dano, Geraldo Docker, MD [facility] Pager [phone]  If 7PM-7AM, please contact night-coverage [email or web address] Password [email or web address] 06/30/2019, 9:04 AM

## 2019-06-30 NOTE — Progress Notes (Signed)
   06/30/19 1016  Clinical Encounter Type  Visited With Patient;Health care provider  Visit Type Initial;Spiritual support  Referral From Nurse  Spiritual Encounters  Spiritual Needs Emotional;Prayer  Stress Factors  Patient Stress Factors Family relationships;Financial concerns;Major life changes   Chaplain responded to a consult. Chaplain met with Ms. Naida Sleight, and we discussed her health changes. Chaplain facilitated a brief life review, learning more about her family, faith, etc. Chaplain offered prayer and support. Chaplain introduced spiritual care services. Spiritual care services available as needed.   Jeri Lager, Chaplain

## 2019-06-30 NOTE — Progress Notes (Signed)
  Echocardiogram 2D Echocardiogram has been performed.  Echocardiogram ended before protocol complete due to patient stating she could not tolerate laying on back or left side. Nurse notified.   Valerie [name] 06/30/2019, 9:40 AM

## 2019-06-30 NOTE — Progress Notes (Signed)
ANTICOAGULATION CONSULT NOTE - Initial Consult  Pharmacy Consult for: Heparin Indication:  VTE Treatment (Tumor thrombus throughout the hepatic system)   Patient Measurements: Height: 5\' 2"  (157.5 cm) Weight: 125 lb 9.6 oz (57 kg) IBW/kg (Calculated) : 50.1 Heparin Dosing Weight: 57 kg  Vital Signs: Temp: 98.7 F (37.1 C) (03/21 2103) Temp Source: Oral (03/21 2103) BP: 155/87 (03/21 2103) Pulse Rate: 86 (03/21 2103)  Labs: Recent Labs    06/28/19 2123 06/28/19 2123 06/28/19 2249 06/29/19 0418 06/29/19 1343 06/30/19 0412 06/30/19 2119  HGB 11.7*   < >  --  10.6*  --  11.4*  --   HCT 34.8*  --   --  31.2*  --  33.9*  --   PLT 108*  --   --  97*  --  114*  --   APTT 32  --   --   --  32  --   --   LABPROT 14.1  --   --   --  14.7  --   --   INR 1.1  --   --   --  1.2  --   --   HEPARINUNFRC  --   --   --   --   --   --  0.19*  CREATININE 0.67  --   --  0.65  --  0.65  --   TROPONINIHS 6  --  8  --   --   --   --    < > = values in this interval not displayed.    Estimated Creatinine Clearance: 64.3 mL/min (by C-G formula based on SCr of 0.65 mg/dL).   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 Phramacy has been consulted to dose heparin. Patient is not on anticoag PTA.  First HL 0.19 subtherapeutic, no bolus was given Platelets ~100.  Goal of Therapy:  Heparin level 0.3-0.7 units/ml Monitor platelets by anticoagulation protocol: Yes   Plan:  Increase heparin infusion to 1100 units/hr Monitor daily HL, CBC/plt Monitor for signs/symptoms of bleeding    Benetta Spar, PharmD, BCPS, BCCP Clinical Pharmacist  Please check AMION for all [place] phone numbers After 10:00 PM, call [place]

## 2019-07-01 ENCOUNTER — Encounter (HOSPITAL_COMMUNITY): Payer: Self-pay | Admitting: Internal Medicine

## 2019-07-01 ENCOUNTER — Inpatient Hospital Stay: Payer: Medicaid Other | Attending: Oncology | Admitting: Oncology

## 2019-07-01 DIAGNOSIS — R160 Hepatomegaly, not elsewhere classified: Secondary | ICD-10-CM

## 2019-07-01 LAB — COMPREHENSIVE METABOLIC PANEL
ALT: 52 U/L — ABNORMAL HIGH (ref 0–44)
AST: 142 U/L — ABNORMAL HIGH (ref 15–41)
Albumin: 2.4 g/dL — ABNORMAL LOW (ref 3.5–5.0)
Alkaline Phosphatase: 188 U/L — ABNORMAL HIGH (ref 38–126)
Anion gap: 8 (ref 5–15)
BUN: 9 mg/dL (ref 6–20)
CO2: 22 mmol/L (ref 22–32)
Calcium: 8.3 mg/dL — ABNORMAL LOW (ref 8.9–10.3)
Chloride: 105 mmol/L (ref 98–111)
Creatinine, Ser: 0.67 mg/dL (ref 0.44–1.00)
GFR calc Af Amer: >60 mL/min (ref >60)
GFR calc non Af Amer: >60 mL/min (ref >60)
Glucose, Bld: 101 mg/dL — ABNORMAL HIGH (ref 70–99)
Potassium: 4 mmol/L (ref 3.5–5.1)
Sodium: 135 mmol/L (ref 135–145)
Total Bilirubin: 1.6 mg/dL — ABNORMAL HIGH (ref 0.3–1.2)
Total Protein: 6.5 g/dL (ref 6.5–8.1)

## 2019-07-01 LAB — MAGNESIUM: Magnesium: 1.8 mg/dL (ref 1.7–2.4)

## 2019-07-01 LAB — CBC WITH DIFFERENTIAL/PLATELET
Abs Immature Granulocytes: 0.01 10*3/uL (ref 0.00–0.07)
Basophils Absolute: 0 10*3/uL (ref 0.0–0.1)
Basophils Relative: 0 %
Eosinophils Absolute: 0.2 10*3/uL (ref 0.0–0.5)
Eosinophils Relative: 4 %
HCT: 32.5 % — ABNORMAL LOW (ref 36.0–46.0)
Hemoglobin: 10.9 g/dL — ABNORMAL LOW (ref 12.0–15.0)
Immature Granulocytes: 0 %
Lymphocytes Relative: 34 %
Lymphs Abs: 2.1 10*3/uL (ref 0.7–4.0)
MCH: 31.2 pg (ref 26.0–34.0)
MCHC: 33.5 g/dL (ref 30.0–36.0)
MCV: 93.1 fL (ref 80.0–100.0)
Monocytes Absolute: 0.6 10*3/uL (ref 0.1–1.0)
Monocytes Relative: 10 %
Neutro Abs: 3.2 10*3/uL (ref 1.7–7.7)
Neutrophils Relative %: 52 %
Platelets: 127 10*3/uL — ABNORMAL LOW (ref 150–400)
RBC: 3.49 MIL/uL — ABNORMAL LOW (ref 3.87–5.11)
RDW: 15.1 % (ref 11.5–15.5)
WBC: 6.1 10*3/uL (ref 4.0–10.5)
nRBC: 0 % (ref 0.0–0.2)

## 2019-07-01 LAB — HEPARIN LEVEL (UNFRACTIONATED)
Heparin Unfractionated: 0.3 IU/mL (ref 0.30–0.70)
Heparin Unfractionated: 0.35 IU/mL (ref 0.30–0.70)

## 2019-07-01 LAB — OCCULT BLOOD X 1 CARD TO LAB, STOOL: Fecal Occult Bld: POSITIVE — AB

## 2019-07-01 LAB — PROCALCITONIN: Procalcitonin: 0.42 ng/mL

## 2019-07-01 LAB — PHOSPHORUS: Phosphorus: 2.6 mg/dL (ref 2.5–4.6)

## 2019-07-01 MED ORDER — FLUOXETINE HCL 20 MG PO CAPS
20.0000 mg | ORAL_CAPSULE | Freq: Every day | ORAL | Status: DC
  Administered 2019-07-01 – 2019-07-04 (×4): 20 mg via ORAL
  Filled 2019-07-01 (×4): qty 1

## 2019-07-01 MED ORDER — MAGNESIUM SULFATE 2 GM/50ML IV SOLN
2.0000 g | Freq: Once | INTRAVENOUS | Status: AC
  Administered 2019-07-01: 2 g via INTRAVENOUS
  Filled 2019-07-01: qty 50

## 2019-07-01 MED ORDER — LORAZEPAM 2 MG/ML IJ SOLN
0.5000 mg | INTRAMUSCULAR | Status: DC | PRN
  Administered 2019-07-01 – 2019-07-02 (×3): 0.5 mg via INTRAVENOUS
  Filled 2019-07-01 (×3): qty 1

## 2019-07-01 NOTE — Progress Notes (Signed)
PROGRESS NOTE    Valerie [name]  EQA:[id number] DOB: 01-01-1966 DOA: 06/28/2019 PCP: Patient, No Pcp Per     Brief Narrative:  Valerie [name] is a 54 y.o. BF PMHx Hx  liver cirrhosis with hepatitis C and per patient patient was treated with radiation for possible liver cancer in 2004 in [place] to the ER because of persistent abdominal pain nausea vomiting diarrhea over the last 3 weeks with posterior occipital headache radiating to her neck.  Denies any weakness of the upper or lower extremities.  Had come to the ER twice.  Given these worsening symptoms patient comes back to the ER again after following up with the primary care.  Denies any chest pain or shortness of breath or productive cough.  ED Course: In the ER Covid test was negative patient was febrile with temperature of 101.5 tachycardic labs showing hemoglobin of 11.7 platelets 108 LFTs with AST of 187 ALT 62 lactic acid was normal.  Patient had CT abdomen pelvis which shows features concerning for hepatocellular carcinoma along the right hepatic lobe with also possible cecal and ascending colon colitis and also possible metastatic lesions in the lung involving the diaphragm also.  Since there was concern for possible cholecystitis jogging was seen also in the sonogram of the abdomen on-call general surgeon was consulted.  Patient started on empiric antibiotics and admitted for further management.   Subjective: 3/23 somnolent but arousable.    Assessment & Plan:   Principal Problem:   SIRS (systemic inflammatory response syndrome) (HCC) Active Problems:   Thrombocytopenia (HCC)   Liver cirrhosis (HCC)   Elevated liver enzymes   Fever   Abdominal pain  SIRS/RUQ pain -Patient started on antibiotics for cholecystitis although patient has elevated alk phos and AST/ALT do not believe secondary to infection however given patient's tenuous condition will continue empiric coverage. -Most likely secondary  to metastatic Valerie [name] -3/21 D5-0.9% saline 39m/hr -3/21 DC antibiotics patient's symptoms not consistent with cholecystitis.  Hepatitis B? -Hepatitis B core antibody equivocal -GI oncology to see patient on 3/22 will determine appropriate next step for determining if false positive. -3/23 consulted ID for patient's ongoing positive hepatitis C will determine best course of action for equivocal hepatitis B core antibody  Liver cirrhosis positive current Hepatitis C -Hepatitis C RNA quant positive 634K, indicating positive hepatitis C infection -3/23 spoke with Dr. JMichel BickersID will see patient  Metastatic Hepatocellular carcinoma -Multiphasic MRI abdomen liver protocol consistent with hepatocellular carcinoma metastatic see results below -AFP pending -3/21 discussed case with Oncology Dr. VNicholas [name] [facility] a.m. to determine future course of action. -Discontinued heparin per oncology recommendation. -3/23 oncology and [facility] awaiting NM full body bone scan, appears they will obtain biopsy before fully determining plan of care.  CHF/pulmonary HTN? -Echogram ruled out CHF see results below  Hypokalemia -Potassium goal> 4  Hypomagnesmia -Magnesium goal> 2  Chronic anemia/Acute Blood loss Anemia (Baseline HgB= 12) Recent Labs  Lab 06/28/19 2123 06/29/19 0418 06/30/19 0412 07/01/19 0500  HGB 11.7* 10.6* 11.4* 10.9*  -Anemia panel pending -3/20 PT/INR 1.2 -3/22 fecal occult blood positive   Chronic thrombocytopenia (platelet baseline 100) -Given patient's chronically low platelets would hold all heparin products.  Pain control/Anxiety -Tramadol 50 mg QID PRN  -3/23 DC fentanyl IV PRN (patient unable to keep her eyes open and engage in conversation) -3/23 fentanyl 25 mcg patch -3/23 Robaxin p.o. 500 mg TID PRN -3/23 DC Ativan  p.o. 0.5 mg BID PRN -3/23 DC OxyIR  Essential HTN -Metoprolol IV 5 mg QID -Hydralazine PRN  Goals of  care -3/21 Palliative Care; patient with extensive metastatic [place] (stage IV?)  Patient and daughter's request information on long-term care issues such as changing CODE STATUS, appointing HCPOA,    DVT prophylaxis: Heparin drip--> SCD Code Status: DNR Family Communication: 3/23 spoke with daughter counseled on plan of care answered all questions Disposition Plan: TBD 1.  Where the patient is from 2.  Anticipated d/c place. 3.  Barriers to d/c completion cancer work-up.  Appears oncology and [facility] awaiting completion of NM full body bone scan to determine where to obtain biopsy    Consultants:  Oncology Valerie [name] Valerie [name] ID  [facility]    Procedures/Significant Events:  3/19 PCXR; findings suggestive of CHF 3/19 RUQ abdominal ultrasound;-under distension of the gallbladder. -Trace pericholecystic fluid is nonspecific with absence of other features of acute cholecystitis. Should be considered within the appropriate clinical setting. -Heterogeneous 4.6 cm mass in the posterior right lobe liver, highly concerning for hepatocellular carcinoma given background of cirrhosis. Consider further characterization with multiphase MRI or CT, preferably on an outpatient basis when patient is better able to tolerate scanning procedure. 3/19 CT abdomen pelvis W contrast;-probable liver cirrhosis. Large heterogeneous suspicion mass measuring at least 8.8 cm, probably arising from the right hepatic lobe of the liver but extending cephalad toward the right lung base with possible violation of the capsule and invasion/involvement of the right diaphragm. -At least 1 additional smaller solid mass in the inferior right hepatic lobe either representing metastatic disease or multifocal disease. Correlation with MRI should be considered. -Numerous pulmonary nodules consistent with metastatic disease -. Possible gallbladder wall thickening or small amount of pericholecystic fluid as seen on recent  ultrasound -Diffuse diverticular disease of the colon with possible wall thickening/mild acute inflammation at the cecum and ascending colon. 3/20 CT head W. Wo contrast; negative intracranial abnormality 3/21 Echocardiogram;LVEF= 60 to 65%.-No regional wall motion abnormalities.  -Left ventricular diastolic function could not be evaluated. 3/20 MRI abdomen W/W. Wo contrast;Cirrhosis with probable diffusely infiltrative hepatocellular carcinoma throughout the right lobe of the liver, including a more well encapsulated area near the dome. This appears to extend out of the hepatic capsule invading the right hemidiaphragm which is enlarged, and potentially invading the lower right hemithorax with possible involvement of the inferior aspect of the right lower lobe.  -Trace right pleural effusion is likely malignant. -Tumor thrombus involving the right portal and hepatic venous systems, as above. -Multiple small pulmonary nodules scattered throughout the visualize lung bases compatible with widespread metastatic disease to the lungs, better demonstrated on chest CT 06/30/2019. -Moderate dilatation of the gallbladder with diffuse gallbladder wall edema. No gallstones and there is no pericholecystic fluid. This likely reflects gallbladder wall edema from intrinsic hepatic disease, and is not favored to indicate an acute cholecystitis    -Multiple distal esophageal and proximal gastric varices. 3/21 CT chest W. Wo contrast:-Multiple pulmonary nodules highly concerning for widespread metastatic disease to the lungs. -Large right lobe of the liver mass with trans diaphragmatic invasion into the lower right hemithorax involving portions of the  right lower lobe. -Aortic atherosclerosis, in addition to left main and left anterior descending coronary artery disease.  3/23 NM bone scan reading pending   I have personally reviewed and interpreted all radiology studies and my findings are as above.  VENTILATOR  SETTINGS:    Cultures 3/20 Hepatitis C  RNA quant positive 634K 3/20 positive equivocal hepatitis B core antibody 3/20 SARS coronavirus negative 3/20 influenza A/B negative    Antimicrobials: Anti-infectives (From admission, onward)   Start     Dose/Rate Stop   06/29/19 1000  piperacillin-tazobactam (ZOSYN) IVPB 3.375 g  Status:  Discontinued     3.375 g 12.5 mL/hr over 240 Minutes 06/30/19 1354   06/29/19 0400  piperacillin-tazobactam (ZOSYN) IVPB 3.375 g     3.375 g 100 mL/hr over 30 Minutes 06/29/19 0537   06/28/19 2245  metroNIDAZOLE (FLAGYL) IVPB 500 mg     500 mg 100 mL/hr over 60 Minutes 06/29/19 0022   06/28/19 2115  cefTRIAXone (ROCEPHIN) 2 g in sodium chloride 0.9 % 100 mL IVPB     2 g 200 mL/hr over 30 Minutes 06/28/19 2141       Devices    LINES / TUBES:      Continuous Infusions: . dextrose 5 % and 0.9% NaCl 75 mL/hr at 07/01/19 0655  . heparin 1,100 Units/hr (06/30/19 2308)  . methocarbamol (ROBAXIN) IV       Objective: Vitals:   06/30/19 0529 06/30/19 1200 06/30/19 2103 07/01/19 0437  BP: (!) 147/95 137/88 (!) 155/87 135/87  Pulse: 71 72 86 86  Resp: '18 20 16 16  '$ Temp: 98.1 F (36.7 C) 98.6 F (37 C) 98.7 F (37.1 C) 99.3 F (37.4 C)  TempSrc: Oral Oral Oral Oral  SpO2: 98% 99% 100% 93%  Weight: 57 kg   57.9 kg  Height:        Intake/Output Summary (Last 24 hours) at 07/01/2019 0827 Last data filed at 07/01/2019 0559 Gross per 24 hour  Intake 1371.05 ml  Output 300 ml  Net 1071.05 ml   Filed Weights   06/29/19 0306 06/30/19 0529 07/01/19 0437  Weight: 54.9 kg 57 kg 57.9 kg   Physical Exam:  General: Somnolent but arousable (oversedated on pain medication), no acute respiratory distress, cachectic Eyes: negative scleral hemorrhage, negative anisocoria, negative icterus ENT: Negative Runny nose, negative gingival bleeding, Neck:  Negative scars, masses, torticollis, lymphadenopathy, JVD Lungs: Clear to auscultation bilaterally  without wheezes or crackles Cardiovascular: Regular rate and rhythm without murmur gallop or rub normal S1 and S2 Abdomen: negative abdominal pain, nondistended, positive soft, bowel sounds, no rebound, no ascites, no appreciable mass Extremities: No significant cyanosis, clubbing, or edema bilateral lower extremities Skin: Negative rashes, lesions, ulcers Psychiatric:  Negative depression, negative anxiety, negative fatigue, negative mania  Central nervous system:  Cranial nerves II through XII intact, tongue/uvula midline, all extremities muscle strength 5/5, sensation intact throughout, negative dysarthria, negative expressive aphasia, negative receptive aphasia.      Data Reviewed: Care during the described time interval was provided by me .  I have reviewed this patient's available data, including medical history, events of note, physical examination, and all test results as part of my evaluation.  CBC: Recent Labs  Lab 06/28/19 2123 06/29/19 0418 06/30/19 0412 07/01/19 0500  WBC 5.9 6.0 7.6 6.1  NEUTROABS 3.9 3.3 4.8 3.2  HGB 11.7* 10.6* 11.4* 10.9*  HCT 34.8* 31.2* 33.9* 32.5*  MCV 94.3 94.3 93.1 93.1  PLT 108* 97* 114* 458*   Basic Metabolic Panel: Recent Labs  Lab 06/28/19 2123 06/29/19 0418 06/30/19 0412 07/01/19 0500  NA 137 137 135 135  K 4.1 3.2* 4.0 4.0  CL 108 106 103 105  CO2 20* 21* 22 22  GLUCOSE 123* 128* 96 101*  BUN '11 8 6 '$ 9  CREATININE 0.67 0.65 0.65 0.67  CALCIUM 8.6* 8.2* 8.6* 8.3*  MG  --   --  1.7 1.8  PHOS  --   --  3.1 2.6   GFR: Estimated Creatinine Clearance: 64.3 mL/min (by C-G formula based on SCr of 0.67 mg/dL). Liver Function Tests: Recent Labs  Lab 06/28/19 2123 06/29/19 0418 06/30/19 0412 07/01/19 0500  AST 187* 151* 149* 142*  ALT 62* 53* 56* 52*  ALKPHOS 238* 207* 193* 188*  BILITOT 1.5* 1.1 1.9* 1.6*  PROT 7.1 6.1* 6.7 6.5  ALBUMIN 2.9* 2.5* 2.6* 2.4*   Recent Labs  Lab 06/28/19 2123  LIPASE 43   No results for  input(s): AMMONIA in the last 168 hours. Coagulation Profile: Recent Labs  Lab 06/28/19 2123 06/29/19 1343  INR 1.1 1.2   Cardiac Enzymes: No results for input(s): CKTOTAL, CKMB, CKMBINDEX, TROPONINI in the last 168 hours. BNP (last 3 results) No results for input(s): PROBNP in the last 8760 hours. HbA1C: No results for input(s): HGBA1C in the last 72 hours. CBG: No results for input(s): GLUCAP in the last 168 hours. Lipid Profile: No results for input(s): CHOL, HDL, LDLCALC, TRIG, CHOLHDL, LDLDIRECT in the last 72 hours. Thyroid Function Tests: No results for input(s): TSH, T4TOTAL, FREET4, T3FREE, THYROIDAB in the last 72 hours. Anemia Panel: Recent Labs    06/29/19 1343  VITAMINB12 2,620*  FOLATE 7.2  FERRITIN 745*  TIBC 241*  IRON 52  RETICCTPCT 2.0   Sepsis Labs: Recent Labs  Lab 06/28/19 2122 06/28/19 2249 06/29/19 0849 06/29/19 1032 06/30/19 0412 07/01/19 0500  PROCALCITON  --   --  0.37  --  0.37 0.42  LATICACIDVEN 1.2 0.7 1.1 0.8  --   --     Recent Results (from the past 240 hour(s))  SARS CORONAVIRUS 2 (TAT 6-24 HRS) Nasopharyngeal Nasopharyngeal Swab     Status: None   Collection Time: 06/22/19  3:16 AM   Specimen: Nasopharyngeal Swab  Result Value Ref Range Status   SARS Coronavirus 2 NEGATIVE NEGATIVE Final    Comment: Performed at [facility], [place] [address], [place], [place] [address]  Blood Culture (routine x 2)     Status: None (Preliminary result)   Collection Time: 06/28/19  9:28 PM   Specimen: BLOOD  Result Value Ref Range Status   Specimen Description BLOOD SITE NOT SPECIFIED  Final   Special Requests   Final    BOTTLES DRAWN AEROBIC AND ANAEROBIC Blood Culture results may not be optimal due to an excessive volume of blood received in culture bottles   Culture   Final    NO GROWTH 2 DAYS Performed at [facility], [place] [address] [place], [place] [address]    Report Status PENDING  Incomplete  Blood Culture (routine x  2)     Status: None (Preliminary result)   Collection Time: 06/28/19  9:28 PM   Specimen: BLOOD  Result Value Ref Range Status   Specimen Description BLOOD SITE NOT SPECIFIED  Final   Special Requests   Final    BOTTLES DRAWN AEROBIC AND ANAEROBIC Blood Culture results may not be optimal due to an excessive volume of blood received in culture bottles   Culture   Final    NO GROWTH 2 DAYS Performed at [facility], [place] [address] [place], [place] [address]    Report Status PENDING  Incomplete  Urine culture     Status: Abnormal   Collection Time: 06/28/19  9:53 PM   Specimen: In/Out Cath Urine  Result Value Ref Range Status   Specimen Description IN/OUT CATH URINE  Final   Special Requests   Final    NONE Performed at [facility], [address] [place], [place] [address]    Culture (A)  Final    10,000 COLONIES/mL MULTIPLE SPECIES PRESENT, SUGGEST RECOLLECTION   Report Status 06/30/2019 FINAL  Final  Respiratory Panel by RT PCR (Flu A&B, Covid) - Nasopharyngeal Swab     Status: None   Collection Time: 06/29/19 12:18 AM   Specimen: Nasopharyngeal Swab  Result Value Ref Range Status   SARS Coronavirus 2 by RT PCR NEGATIVE NEGATIVE Final    Comment: (NOTE) SARS-CoV-2 target nucleic acids are NOT DETECTED. The SARS-CoV-2 RNA is generally detectable in upper respiratoy specimens during the acute phase of infection. The lowest concentration of SARS-CoV-2 viral copies this assay can detect is 131 copies/mL. A negative result does not preclude SARS-Cov-2 infection and should not be used as the sole basis for treatment or other patient management decisions. A negative result may occur with  improper specimen collection/handling, submission of specimen other than nasopharyngeal swab, presence of viral mutation(s) within the areas targeted by this assay, and inadequate number of viral copies (<131 copies/mL). A negative result must be combined with clinical observations,  patient history, and epidemiological information. The expected result is Negative. Fact Sheet for Patients:  PinkCheek.be Fact Sheet for Healthcare Providers:  GravelBags.it This test is not yet ap proved or cleared by the [place] FDA and  has been authorized for detection and/or diagnosis of SARS-CoV-2 by FDA under an Emergency Use Authorization (EUA). This EUA will remain  in effect (meaning this test can be used) for the duration of the COVID-19 declaration under Section 564(b)(1) of the Act, 21 U.S.C. section 360bbb-3(b)(1), unless the authorization is terminated or revoked sooner.    Influenza A by PCR NEGATIVE NEGATIVE Final   Influenza B by PCR NEGATIVE NEGATIVE Final    Comment: (NOTE) The Xpert Xpress SARS-CoV-2/FLU/RSV assay is intended as an aid in  the diagnosis of influenza from Nasopharyngeal swab specimens and  should not be used as a sole basis for treatment. Nasal washings and  aspirates are unacceptable for Xpert Xpress SARS-CoV-2/FLU/RSV  testing. Fact Sheet for Patients: PinkCheek.be Fact Sheet for Healthcare Providers: GravelBags.it This test is not yet approved or cleared by the [place] FDA and  has been authorized for detection and/or diagnosis of SARS-CoV-2 by  FDA under an Emergency Use Authorization (EUA). This EUA will remain  in effect (meaning this test can be used) for the duration of the  Covid-19 declaration under Section 564(b)(1) of the Act, 21  U.S.C. section 360bbb-3(b)(1), unless the authorization is  terminated or revoked. Performed at [facility], [place] [address] [place], [place] [address]          Radiology Studies: CT CHEST WO CONTRAST  Result Date: 06/30/2019 CLINICAL DATA:  54 year old female with history of cancer of unknown primary origin. EXAM: CT CHEST WITHOUT CONTRAST TECHNIQUE: Multidetector  CT imaging of the chest was performed following the standard protocol without IV contrast. COMPARISON:  No prior chest CT. CT the abdomen and pelvis 06/28/2019. FINDINGS: Cardiovascular: Heart size is normal. There is no significant pericardial fluid, thickening or pericardial calcification. There is aortic atherosclerosis, as well as atherosclerosis of the great vessels of the mediastinum and the coronary arteries, including calcified atherosclerotic plaque in the left main  and left anterior descending coronary arteries. Mediastinum/Nodes: Multiple prominent borderline enlarged mediastinal lymph nodes measuring up to 9 mm in short axis, nonspecific. Esophagus is unremarkable in appearance. No axillary lymphadenopathy. Lungs/Pleura: Multiple pulmonary nodules are again noted throughout the lungs bilaterally, concerning for widespread metastatic disease. These nodules generally range from 2-8 mm in size. In addition, at the right lung base in the right lower lobe there is a mass-like area which appears related to direct invasion across the right hemidiaphragm from large liver mass, better demonstrated on prior CT the abdomen and pelvis 06/28/2019 (see that report for full details). Trace volume of right pleural fluid lying dependently. No left pleural effusion. Upper Abdomen: Large mass in the right lobe of the liver poorly demonstrated on today's noncontrast CT examination. See report from contemporaneously obtained abdominal MRI 06/29/2019 for full description of findings beneath the diaphragm. Musculoskeletal: There are no aggressive appearing lytic or blastic lesions noted in the visualized portions of the skeleton. IMPRESSION: 1. Multiple pulmonary nodules highly concerning for widespread metastatic disease to the lungs. 2. Large right lobe of the liver mass with trans diaphragmatic invasion into the lower right hemithorax involving portions of the right lower lobe. 3. Aortic atherosclerosis, in addition to left  main and left anterior descending coronary artery disease. Please note that although the presence of coronary artery calcium documents the presence of coronary artery disease, the severity of this disease and any potential stenosis cannot be assessed on this non-gated CT examination. Assessment for potential risk factor modification, dietary therapy or pharmacologic therapy may be warranted, if clinically indicated. Aortic Atherosclerosis (ICD10-I70.0). Electronically Signed   By: Vinnie Langton M.D.   On: 06/30/2019 12:48   MR ABDOMEN W WO CONTRAST  Result Date: 06/30/2019 CLINICAL DATA:  54 year old female with liver lesion noted on prior CT examination. Follow-up study. EXAM: MRI ABDOMEN WITHOUT AND WITH CONTRAST TECHNIQUE: Multiplanar multisequence MR imaging of the abdomen was performed both before and after the administration of intravenous contrast. CONTRAST:  5.59m GADAVIST GADOBUTROL 1 MMOL/ML IV SOLN COMPARISON:  No prior abdominal MRI. CT the abdomen and pelvis 06/28/2019. FINDINGS: Comment: Portions of today's examination are limited by extensive patient respiratory motion. Lower chest: Trace right pleural effusion lying dependently. Numerous small areas of increased signal intensity corresponding to pulmonary nodules noted throughout the visualize lung bases. Hepatobiliary: Liver has a slightly shrunken appearance and nodular contour, indicative of underlying cirrhosis. There is heterogeneous signal intensity throughout the liver, most evident in the right lobe of the liver where there are widespread somewhat ill-defined areas of mild T2 hyperintensity and T1 hypointensity. In the superior aspect of segment 7 of the liver there is a well-defined mass (axial image 9 of series 10 and coronal image 9 of series 4) measuring 4.4 x 4.4 x 4.1 cm, which demonstrates heterogeneous signal intensity on T1 and T2 weighted images, but generally has a peripheral rim of T2 hypointensity, and demonstrates  heterogeneous internal enhancement on post gadolinium images without a specific enhancement pattern. Extending cephalad from this lesion there is additional irregular malignant appearing soft tissue which appears to directly invade the right hemidiaphragm best appreciated on coronal image 10 of series 4 and axial image 6 of series 5 where this is estimated to measure approximately 8.6 x 7.2 x 3.3 cm. This may also directly invade the inferior aspect of the right lower lobe, although this is difficult to say for certain on today's magnetic resonance examination. Notably, the ill-defined areas of T2 hyperintensity throughout the  right lobe of the liver correspond to relatively diffuse arterial phase hyperenhancement on post gadolinium images which decreased on subsequent post gadolinium delayed imaging. Markedly heterogeneous perfusion is evident throughout the posterior aspect of the right lobe of the liver, predominantly segments 7 and 6, suggesting widespread infiltrative poorly defined neoplasm in these regions (although some of this could be accounted for by the altered perfusion throughout this portion of the liver related to portal and hepatic venous obstruction). The right branches of the portal vein appear dilated and do not demonstrate normal portal venous enhancement, with abrupt truncation of the right main portal vein best appreciated on axial image 42 of series 19), suggesting extensive tumor thrombus in the portal venous system of the right lobe of the liver. There is also incomplete opacification of the right hepatic vein (axial image 36 of series 19), suggesting additional tumor thrombus in the right hepatic vein. No intra or extrahepatic biliary ductal dilatation. Gallbladder is moderately distended with diffuse gallbladder wall edema, but no overt surrounding inflammatory changes. No filling defects in the gallbladder to suggest cholelithiasis. Pancreas: No pancreatic mass. No pancreatic ductal  dilatation. No pancreatic or peripancreatic fluid collections or inflammatory changes. Spleen: Spleen appears borderline enlarged measuring 11.3 x 5.7 x 11.2 cm (estimated splenic volume of 361 mL) . Adrenals/Urinary Tract: Bilateral kidneys and adrenal glands are normal in appearance. No hydroureteronephrosis in the visualized portions of the abdomen. Stomach/Bowel: Visualized portions are unremarkable. Vascular/Lymphatic: No aneurysm identified in the visualized abdominal vasculature. Probable tumor thrombus in the right portal and hepatic venous systems, as detailed above. Numerous serpiginous densities adjacent to the proximal stomach and distal esophagus, compatible with varices. No definite lymphadenopathy confidently identified in the abdomen. Other: No significant volume of ascites noted in the visualized portions of the peritoneal cavity. Musculoskeletal: No aggressive appearing osseous lesions are noted in the visualized portions of the skeleton. IMPRESSION: 1. Cirrhosis with probable diffusely infiltrative hepatocellular carcinoma throughout the right lobe of the liver, including a more well encapsulated area in segment 7 near the dome. This appears to extend out of the hepatic capsule invading the right hemidiaphragm which is enlarged, and potentially invading the lower right hemithorax with possible involvement of the inferior aspect of the right lower lobe. Trace right pleural effusion is likely malignant. 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 2. Multiple small pulmonary nodules scattered throughout the visualize lung bases compatible with widespread metastatic disease to the lungs, better demonstrated on chest CT 06/30/2019. 3. Moderate dilatation of the gallbladder with diffuse gallbladder wall edema. However, there are no gallstones and there is no pericholecystic fluid. This likely reflects gallbladder wall edema from intrinsic hepatic disease, and is not  favored to indicate an acute cholecystitis at this time. 4. Borderline splenomegaly. 5. 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 Electronically Signed   By: Vinnie Langton M.D.   On: 06/30/2019 13:12   ECHOCARDIOGRAM COMPLETE  Result Date: 06/30/2019    ECHOCARDIOGRAM REPORT   Patient Name:   JOSSALYN FORGIONE Date of Exam: 06/30/2019 Medical Rec #:  [id number]    Height:       62.0 in Accession #:    [id number]   Weight:       125.6 lb Date of Birth:  12-09-1965    BSA:          1.569 m Patient Age:    35 years     BP:  147/95 mmHg Patient Gender: F            HR:           71 bpm. Exam Location:  Inpatient Procedure: 2D Echo, Cardiac Doppler and Color Doppler Indications:    Pulmonary hypertension 416.8/I27.2  History:        Patient has no prior history of Echocardiogram examinations.                 Risk Factors:Current Smoker. Thrombocytopenia. Liver cirrhosis.                 SIRS. Hepatitis C. Marijuana abuse.  Sonographer:    Clayton Lefort RDCS (AE) Referring Phys: [id number] [place]  Sonographer Comments: Patient movement. Echocardiogram ended before protocol complete due to patient stating she could not tolerate laying on back or left side. Nurse notified. IMPRESSIONS  1. Limited images, incomplete study.  2. Left ventricular ejection fraction, by estimation, is 60 to 65%. The left ventricle has normal function. The left ventricle has no regional wall motion abnormalities. Left ventricular diastolic function could not be evaluated.  3. Right ventricular systolic function is normal. The right ventricular size is normal. Tricuspid regurgitation signal is inadequate for assessing PA pressure.  4. The mitral valve is grossly normal. Trivial mitral valve regurgitation.  5. The aortic valve is tricuspid. Aortic valve regurgitation is not visualized. FINDINGS  Left Ventricle: Left ventricular ejection fraction, by estimation, is 60 to 65%. The left ventricle has normal function. The left  ventricle has no regional wall motion abnormalities. The left ventricular internal cavity size was normal in size. There is  borderline left ventricular hypertrophy. Left ventricular diastolic function could not be evaluated. Right Ventricle: The right ventricular size is normal. No increase in right ventricular wall thickness. Right ventricular systolic function is normal. Tricuspid regurgitation signal is inadequate for assessing PA pressure. Left Atrium: Left atrial size was normal in size. Right Atrium: Right atrial size was normal in size. Pericardium: There is no evidence of pericardial effusion. Mitral Valve: The mitral valve is grossly normal. Trivial mitral valve regurgitation. Tricuspid Valve: The tricuspid valve is grossly normal. Tricuspid valve regurgitation is mild. Aortic Valve: The aortic valve is tricuspid. Aortic valve regurgitation is not visualized. Mild aortic valve annular calcification. Pulmonic Valve: The pulmonic valve was grossly normal. Pulmonic valve regurgitation is mild. Aorta: The aortic root is normal in size and structure. Venous: The inferior vena cava was not well visualized. IAS/Shunts: No atrial level shunt detected by color flow Doppler.  LEFT VENTRICLE PLAX 2D LVIDd:         4.90 cm LVIDs:         3.10 cm LV PW:         1.10 cm LV IVS:        0.90 cm LVOT diam:     1.80 cm LVOT Area:     2.54 cm  LEFT ATRIUM         Index LA diam:    3.60 cm 2.29 cm/m   AORTA Ao Root diam: 2.60 cm Ao Asc diam:  3.20 cm TRICUSPID VALVE TR Peak grad:   22.1 mmHg TR Vmax:        235.00 cm/s  SHUNTS Systemic Diam: 1.80 cm Rozann Lesches MD Electronically signed by Rozann Lesches MD Signature Date/Time: 06/30/2019/11:31:13 AM    Final         Scheduled Meds: . feeding supplement (ENSURE ENLIVE)  237 mL Oral BID BM  .  metoprolol tartrate  5 mg Intravenous Q6H   Continuous Infusions: . dextrose 5 % and 0.9% NaCl 75 mL/hr at 07/01/19 0655  . heparin 1,100 Units/hr (06/30/19 2308)  .  methocarbamol (ROBAXIN) IV       LOS: 2 days    Time spent:40 min    Andrienne Havener, Geraldo Docker, MD [facility] Pager [phone]  If 7PM-7AM, please contact night-coverage [email or web address] Password [name] 07/01/2019, 8:27 AM

## 2019-07-01 NOTE — Progress Notes (Signed)
PROGRESS NOTE    Valerie [name]  XVQ:[id number] DOB: May 09, 1965 DOA: 06/28/2019 PCP: Patient, No Pcp Per     Brief Narrative:  Valerie [name] is a 54 y.o. BF PMHx Hx  liver cirrhosis with hepatitis C and per patient patient was treated with radiation for possible liver cancer in 2004 in [place] to the ER because of persistent abdominal pain nausea vomiting diarrhea over the last 3 weeks with posterior occipital headache radiating to her neck.  Denies any weakness of the upper or lower extremities.  Had come to the ER twice.  Given these worsening symptoms patient comes back to the ER again after following up with the primary care.  Denies any chest pain or shortness of breath or productive cough.  ED Course: In the ER Covid test was negative patient was febrile with temperature of 101.5 tachycardic labs showing hemoglobin of 11.7 platelets 108 LFTs with AST of 187 ALT 62 lactic acid was normal.  Patient had CT abdomen pelvis which shows features concerning for hepatocellular carcinoma along the right hepatic lobe with also possible cecal and ascending colon colitis and also possible metastatic lesions in the lung involving the diaphragm also.  Since there was concern for possible cholecystitis jogging was seen also in the sonogram of the abdomen on-call general surgeon was consulted.  Patient started on empiric antibiotics and admitted for further management.   Subjective: 3/22 A/O x4, negative CP, negative S OB.  Positive RUQ abdominal pain.  Positive right paracervical/occipital pain    Assessment & Plan:   Principal Problem:   SIRS (systemic inflammatory response syndrome) (HCC) Active Problems:   Thrombocytopenia (HCC)   Liver cirrhosis (HCC)   Elevated liver enzymes   Fever   Abdominal pain  SIRS/RUQ pain -Patient started on antibiotics for cholecystitis although patient has elevated alk phos and AST/ALT do not believe secondary to infection however given  patient's tenuous condition will continue empiric coverage. -Most likely secondary to metastatic [place] -3/21 D5-0.9% saline 6m/hr -3/21 DC antibiotics patient's symptoms not consistent with cholecystitis.  Hepatitis B? -Hepatitis B core antibody equivocal -GI oncology to see patient on 3/22 will determine appropriate next step for determining if false positive.  Liver cirrhosis positive current Hepatitis C -Hepatitis C RNA quant positive 634K, indicating positive hepatitis C infection  Metastatic Hepatocellular carcinoma -Multiphasic MRI abdomen liver protocol consistent with hepatocellular carcinoma metastatic see results below -AFP pending -3/21 discussed case with Oncology Dr. V[facility]icholas [facility]ose [facility] to determine future course of action. -3/22 per [facility] and oncology nuclear medicine whole-body scan pending. -3/22 Dr. BJulieanne Mansononcology to see patient today  CHF/pulmonary HTN? -Echogram ruled out CHF see results below  Hypokalemia -Potassium goal> 4  Hypomagnesmia -Magnesium goal> 2 -3/22 magnesium IV 2 g  Chronic anemia?  (Baseline HgB= 12) -Anemia panel pending -3/20 PT/INR 1.2 -3/22 occult blood positive.  Will need to continue with anticoagulant extremely high risk of additional thrombus  Chronic thrombocytopenia (platelet baseline 100) -Given patient's chronically low platelets would hold all heparin products.  Pain control/anxiety  -Tramadol PRN -Fentanyl IV PRN -Robaxin PRN -Ativan PRN -3/22 Prozac 20 mg daily  Essential HTN -Metoprolol IV 5 mg QID -Hydralazine PRN  Goals of care -3/21 Palliative Care; patient with extensive metastatic [place](stage IV?)  Patient and daughter's request information on long-term care issues such as changing CODE STATUS, appointing HCPOA,    DVT prophylaxis: Heparin drip Code Status: DNR  Family Communication: 3/21 spoke with both daughters Ellaricka and LaLa counseled on plan of  care answered all questions Disposition Plan: TBD 1.  Where the patient is from 2.  Anticipated d/c place. 3.  Barriers to d/c completion cancer work-up    Consultants:  Oncology Dr. Nicholas Lose    Procedures/Significant Events:  3/19 PCXR; findings suggestive of CHF 3/19 RUQ abdominal ultrasound;-under distension of the gallbladder. -Trace pericholecystic fluid is nonspecific with absence of other features of acute cholecystitis. Should be considered within the appropriate clinical setting. -Heterogeneous 4.6 cm mass in the posterior right lobe liver, highly concerning for hepatocellular carcinoma given background of cirrhosis. Consider further characterization with multiphase MRI or CT, preferably on an outpatient basis when patient is better able to tolerate scanning procedure. 3/19 CT abdomen pelvis W contrast;-probable liver cirrhosis. Large heterogeneous suspicion mass measuring at least 8.8 cm, probably arising from the right hepatic lobe of the liver but extending cephalad toward the right lung base with possible violation of the capsule and invasion/involvement of the right diaphragm. -At least 1 additional smaller solid mass in the inferior right hepatic lobe either representing metastatic disease or multifocal disease. Correlation with MRI should be considered. -Numerous pulmonary nodules consistent with metastatic disease -. Possible gallbladder wall thickening or small amount of pericholecystic fluid as seen on recent ultrasound -Diffuse diverticular disease of the colon with possible wall thickening/mild acute inflammation at the cecum and ascending colon. 3/20 CT head W. Wo contrast; negative intracranial abnormality 3/21 Echocardiogram;LVEF= 60 to 65%.-No regional wall motion abnormalities.  -Left ventricular diastolic function could not be evaluated. 3/20 MRI abdomen W/W. Wo contrast;Cirrhosis with probable diffusely infiltrative hepatocellular carcinoma throughout the  right lobe of the liver, including a more well encapsulated area near the dome. This appears to extend out of the hepatic capsule invading the right hemidiaphragm which is enlarged, and potentially invading the lower right hemithorax with possible involvement of the inferior aspect of the right lower lobe.  -Trace right pleural effusion is likely malignant. -Tumor thrombus involving the right portal and hepatic venous systems, as above. -Multiple small pulmonary nodules scattered throughout the visualize lung bases compatible with widespread metastatic disease to the lungs, better demonstrated on chest CT 06/30/2019. -Moderate dilatation of the gallbladder with diffuse gallbladder wall edema. No gallstones and there is no pericholecystic fluid. This likely reflects gallbladder wall edema from intrinsic hepatic disease, and is not favored to indicate an acute cholecystitis    -Multiple distal esophageal and proximal gastric varices. 3/21 CT chest W. Wo contrast:-Multiple pulmonary nodules highly concerning for widespread metastatic disease to the lungs. -Large right lobe of the liver mass with trans diaphragmatic invasion into the lower right hemithorax involving portions of the  right lower lobe. -Aortic atherosclerosis, in addition to left main and left anterior descending coronary artery disease.    I have personally reviewed and interpreted all radiology studies and my findings are as above.  VENTILATOR SETTINGS:    Cultures 3/20 Hepatitis C RNA quant positive 634K 3/20 positive equivocal hepatitis B core antibody 3/20 SARS coronavirus negative 3/20 influenza A/B negative   Antimicrobials: Anti-infectives (From admission, onward)   Start     Dose/Rate Stop   06/29/19 1000  piperacillin-tazobactam (ZOSYN) IVPB 3.375 g  Status:  Discontinued     3.375 g 12.5 mL/hr over 240 Minutes 06/30/19 1354   06/29/19 0400  piperacillin-tazobactam (ZOSYN) IVPB 3.375 g     3.375 g 100 mL/hr  over 30 Minutes 06/29/19 0537  06/28/19 2245  metroNIDAZOLE (FLAGYL) IVPB 500 mg     500 mg 100 mL/hr over 60 Minutes 06/29/19 0022   06/28/19 2115  cefTRIAXone (ROCEPHIN) 2 g in sodium chloride 0.9 % 100 mL IVPB     2 g 200 mL/hr over 30 Minutes 06/28/19 2141       Devices    LINES / TUBES:      Continuous Infusions: . dextrose 5 % and 0.9% NaCl 75 mL/hr at 07/01/19 0655  . heparin 1,150 Units/hr (07/01/19 0910)  . methocarbamol (ROBAXIN) IV       Objective: Vitals:   06/30/19 1200 06/30/19 2103 07/01/19 0437 07/01/19 1147  BP: 137/88 (!) 155/87 135/87 (!) 147/89  Pulse: 72 86 86 70  Resp: '20 16 16 18  '$ Temp: 98.6 F (37 C) 98.7 F (37.1 C) 99.3 F (37.4 C) 98.3 F (36.8 C)  TempSrc: Oral Oral Oral Oral  SpO2: 99% 100% 93% 94%  Weight:   57.9 kg   Height:        Intake/Output Summary (Last 24 hours) at 07/01/2019 1233 Last data filed at 07/01/2019 1219 Gross per 24 hour  Intake 1536.05 ml  Output 500 ml  Net 1036.05 ml   Filed Weights   06/29/19 0306 06/30/19 0529 07/01/19 0437  Weight: 54.9 kg 57 kg 57.9 kg   Physical Exam:  General: A/O x4, no acute respiratory distress, cachectic Eyes: negative scleral hemorrhage, negative anisocoria, negative icterus ENT: Negative Runny nose, negative gingival bleeding, Neck:  Negative scars, masses, torticollis, lymphadenopathy, JVD, positive RIGHT paracervical/occipital pain to palpation Lungs: Clear to auscultation bilaterally without wheezes or crackles Cardiovascular: Regular rate and rhythm without murmur gallop or rub normal S1 and S2 Abdomen: Positive abdominal pain RUQ>> remainder of abdomen, nondistended, positive soft, bowel sounds, no rebound, no ascites, no appreciable mass Extremities: No significant cyanosis, clubbing, or edema bilateral lower extremities Skin: Negative rashes, lesions, ulcers Psychiatric:  Negative depression, positive anxiety, negative fatigue, negative mania  Central nervous  system:  Cranial nerves II through XII intact, tongue/uvula midline, all extremities muscle strength 5/5, sensation intact throughout, negative dysarthria, negative expressive aphasia, negative receptive aphasia.      Data Reviewed: Care during the described time interval was provided by me .  I have reviewed this patient's available data, including medical history, events of note, physical examination, and all test results as part of my evaluation.  CBC: Recent Labs  Lab 06/28/19 2123 06/29/19 0418 06/30/19 0412 07/01/19 0500  WBC 5.9 6.0 7.6 6.1  NEUTROABS 3.9 3.3 4.8 3.2  HGB 11.7* 10.6* 11.4* 10.9*  HCT 34.8* 31.2* 33.9* 32.5*  MCV 94.3 94.3 93.1 93.1  PLT 108* 97* 114* 449*   Basic Metabolic Panel: Recent Labs  Lab 06/28/19 2123 06/29/19 0418 06/30/19 0412 07/01/19 0500  NA 137 137 135 135  K 4.1 3.2* 4.0 4.0  CL 108 106 103 105  CO2 20* 21* 22 22  GLUCOSE 123* 128* 96 101*  BUN '11 8 6 9  '$ CREATININE 0.67 0.65 0.65 0.67  CALCIUM 8.6* 8.2* 8.6* 8.3*  MG  --   --  1.7 1.8  PHOS  --   --  3.1 2.6   GFR: Estimated Creatinine Clearance: 64.3 mL/min (by C-G formula based on SCr of 0.67 mg/dL). Liver Function Tests: Recent Labs  Lab 06/28/19 2123 06/29/19 0418 06/30/19 0412 07/01/19 0500  AST 187* 151* 149* 142*  ALT 62* 53* 56* 52*  ALKPHOS 238* 207* 193* 188*  BILITOT 1.5*  1.1 1.9* 1.6*  PROT 7.1 6.1* 6.7 6.5  ALBUMIN 2.9* 2.5* 2.6* 2.4*   Recent Labs  Lab 06/28/19 2123  LIPASE 43   No results for input(s): AMMONIA in the last 168 hours. Coagulation Profile: Recent Labs  Lab 06/28/19 2123 06/29/19 1343  INR 1.1 1.2   Cardiac Enzymes: No results for input(s): CKTOTAL, CKMB, CKMBINDEX, TROPONINI in the last 168 hours. BNP (last 3 results) No results for input(s): PROBNP in the last 8760 hours. HbA1C: No results for input(s): HGBA1C in the last 72 hours. CBG: No results for input(s): GLUCAP in the last 168 hours. Lipid Profile: No results for  input(s): CHOL, HDL, LDLCALC, TRIG, CHOLHDL, LDLDIRECT in the last 72 hours. Thyroid Function Tests: No results for input(s): TSH, T4TOTAL, FREET4, T3FREE, THYROIDAB in the last 72 hours. Anemia Panel: Recent Labs    06/29/19 1343  VITAMINB12 2,620*  FOLATE 7.2  FERRITIN 745*  TIBC 241*  IRON 52  RETICCTPCT 2.0   Sepsis Labs: Recent Labs  Lab 06/28/19 2122 06/28/19 2249 06/29/19 0849 06/29/19 1032 06/30/19 0412 07/01/19 0500  PROCALCITON  --   --  0.37  --  0.37 0.42  LATICACIDVEN 1.2 0.7 1.1 0.8  --   --     Recent Results (from the past 240 hour(s))  SARS CORONAVIRUS 2 (TAT 6-24 HRS) Nasopharyngeal Nasopharyngeal Swab     Status: None   Collection Time: 06/22/19  3:16 AM   Specimen: Nasopharyngeal Swab  Result Value Ref Range Status   SARS Coronavirus 2 NEGATIVE NEGATIVE Final    Comment: Performed at [facility], [place] [address], [place], [place] [address]  Blood Culture (routine x 2)     Status: None (Preliminary result)   Collection Time: 06/28/19  9:28 PM   Specimen: BLOOD  Result Value Ref Range Status   Specimen Description BLOOD SITE NOT SPECIFIED  Final   Special Requests   Final    BOTTLES DRAWN AEROBIC AND ANAEROBIC Blood Culture results may not be optimal due to an excessive volume of blood received in culture bottles   Culture   Final    NO GROWTH 3 DAYS Performed at [facility], [place] [address], [place], [place] [address]    Report Status PENDING  Incomplete  Blood Culture (routine x 2)     Status: None (Preliminary result)   Collection Time: 06/28/19  9:28 PM   Specimen: BLOOD  Result Value Ref Range Status   Specimen Description BLOOD SITE NOT SPECIFIED  Final   Special Requests   Final    BOTTLES DRAWN AEROBIC AND ANAEROBIC Blood Culture results may not be optimal due to an excessive volume of blood received in culture bottles   Culture   Final    NO GROWTH 3 DAYS Performed at [facility], [place] [address] [place], [place]  [address]    Report Status PENDING  Incomplete  Urine culture     Status: Abnormal   Collection Time: 06/28/19  9:53 PM   Specimen: In/Out Cath Urine  Result Value Ref Range Status   Specimen Description IN/OUT CATH URINE  Final   Special Requests   Final    NONE Performed at [facility], [place] [address] [place],  [address]    Culture (A)  Final    10,000 COLONIES/mL MULTIPLE SPECIES PRESENT, SUGGEST RECOLLECTION   Report Status 06/30/2019 FINAL  Final  Respiratory Panel by RT PCR (Flu A&B, Covid) - Nasopharyngeal  Swab     Status: None   Collection Time: 06/29/19 12:18 AM   Specimen: Nasopharyngeal Swab  Result Value Ref Range Status   SARS Coronavirus 2 by RT PCR NEGATIVE NEGATIVE Final    Comment: (NOTE) SARS-CoV-2 target nucleic acids are NOT DETECTED. The SARS-CoV-2 RNA is generally detectable in upper respiratoy specimens during the acute phase of infection. The lowest concentration of SARS-CoV-2 viral copies this assay can detect is 131 copies/mL. A negative result does not preclude SARS-Cov-2 infection and should not be used as the sole basis for treatment or other patient management decisions. A negative result may occur with  improper specimen collection/handling, submission of specimen other than nasopharyngeal swab, presence of viral mutation(s) within the areas targeted by this assay, and inadequate number of viral copies (<131 copies/mL). A negative result must be combined with clinical observations, patient history, and epidemiological information. The expected result is Negative. Fact Sheet for Patients:  PinkCheek.be Fact Sheet for Healthcare Providers:  GravelBags.it This test is not yet ap proved or cleared by the [place] FDA and  has been authorized for detection and/or diagnosis of SARS-CoV-2 by FDA under an Emergency Use Authorization (EUA). This EUA will remain  in effect (meaning  this test can be used) for the duration of the COVID-19 declaration under Section 564(b)(1) of the Act, 21 U.S.C. section 360bbb-3(b)(1), unless the authorization is terminated or revoked sooner.    Influenza A by PCR NEGATIVE NEGATIVE Final   Influenza B by PCR NEGATIVE NEGATIVE Final    Comment: (NOTE) The Xpert Xpress SARS-CoV-2/FLU/RSV assay is intended as an aid in  the diagnosis of influenza from Nasopharyngeal swab specimens and  should not be used as a sole basis for treatment. Nasal washings and  aspirates are unacceptable for Xpert Xpress SARS-CoV-2/FLU/RSV  testing. Fact Sheet for Patients: PinkCheek.be Fact Sheet for Healthcare Providers: GravelBags.it This test is not yet approved or cleared by the [place] FDA and  has been authorized for detection and/or diagnosis of SARS-CoV-2 by  FDA under an Emergency Use Authorization (EUA). This EUA will remain  in effect (meaning this test can be used) for the duration of the  Covid-19 declaration under Section 564(b)(1) of the Act, 21  U.S.C. section 360bbb-3(b)(1), unless the authorization is  terminated or revoked. Performed at [facility], [place] [address], [place], [place] [address]          Radiology Studies: CT CHEST WO CONTRAST  Result Date: 06/30/2019 CLINICAL DATA:  54 year old female with history of cancer of unknown primary origin. EXAM: CT CHEST WITHOUT CONTRAST TECHNIQUE: Multidetector CT imaging of the chest was performed following the standard protocol without IV contrast. COMPARISON:  No prior chest CT. CT the abdomen and pelvis 06/28/2019. FINDINGS: Cardiovascular: Heart size is normal. There is no significant pericardial fluid, thickening or pericardial calcification. There is aortic atherosclerosis, as well as atherosclerosis of the great vessels of the mediastinum and the coronary arteries, including calcified atherosclerotic plaque in the  left main and left anterior descending coronary arteries. Mediastinum/Nodes: Multiple prominent borderline enlarged mediastinal lymph nodes measuring up to 9 mm in short axis, nonspecific. Esophagus is unremarkable in appearance. No axillary lymphadenopathy. Lungs/Pleura: Multiple pulmonary nodules are again noted throughout the lungs bilaterally, concerning for widespread metastatic disease. These nodules generally range from 2-8 mm in size. In addition, at the right lung base in the right lower lobe there is a mass-like area which appears related to direct invasion across the right hemidiaphragm  from large liver mass, better demonstrated on prior CT the abdomen and pelvis 06/28/2019 (see that report for full details). Trace volume of right pleural fluid lying dependently. No left pleural effusion. Upper Abdomen: Large mass in the right lobe of the liver poorly demonstrated on today's noncontrast CT examination. See report from contemporaneously obtained abdominal MRI 06/29/2019 for full description of findings beneath the diaphragm. Musculoskeletal: There are no aggressive appearing lytic or blastic lesions noted in the visualized portions of the skeleton. IMPRESSION: 1. Multiple pulmonary nodules highly concerning for widespread metastatic disease to the lungs. 2. Large right lobe of the liver mass with trans diaphragmatic invasion into the lower right hemithorax involving portions of the right lower lobe. 3. Aortic atherosclerosis, in addition to left main and left anterior descending coronary artery disease. Please note that although the presence of coronary artery calcium documents the presence of coronary artery disease, the severity of this disease and any potential stenosis cannot be assessed on this non-gated CT examination. Assessment for potential risk factor modification, dietary therapy or pharmacologic therapy may be warranted, if clinically indicated. Aortic Atherosclerosis (ICD10-I70.0).  Electronically Signed   By: Vinnie Langton M.D.   On: 06/30/2019 12:48   MR ABDOMEN W WO CONTRAST  Result Date: 06/30/2019 CLINICAL DATA:  54 year old female with liver lesion noted on prior CT examination. Follow-up study. EXAM: MRI ABDOMEN WITHOUT AND WITH CONTRAST TECHNIQUE: Multiplanar multisequence MR imaging of the abdomen was performed both before and after the administration of intravenous contrast. CONTRAST:  5.63m GADAVIST GADOBUTROL 1 MMOL/ML IV SOLN COMPARISON:  No prior abdominal MRI. CT the abdomen and pelvis 06/28/2019. FINDINGS: Comment: Portions of today's examination are limited by extensive patient respiratory motion. Lower chest: Trace right pleural effusion lying dependently. Numerous small areas of increased signal intensity corresponding to pulmonary nodules noted throughout the visualize lung bases. Hepatobiliary: Liver has a slightly shrunken appearance and nodular contour, indicative of underlying cirrhosis. There is heterogeneous signal intensity throughout the liver, most evident in the right lobe of the liver where there are widespread somewhat ill-defined areas of mild T2 hyperintensity and T1 hypointensity. In the superior aspect of segment 7 of the liver there is a well-defined mass (axial image 9 of series 10 and coronal image 9 of series 4) measuring 4.4 x 4.4 x 4.1 cm, which demonstrates heterogeneous signal intensity on T1 and T2 weighted images, but generally has a peripheral rim of T2 hypointensity, and demonstrates heterogeneous internal enhancement on post gadolinium images without a specific enhancement pattern. Extending cephalad from this lesion there is additional irregular malignant appearing soft tissue which appears to directly invade the right hemidiaphragm best appreciated on coronal image 10 of series 4 and axial image 6 of series 5 where this is estimated to measure approximately 8.6 x 7.2 x 3.3 cm. This may also directly invade the inferior aspect of the  right lower lobe, although this is difficult to say for certain on today's magnetic resonance examination. Notably, the ill-defined areas of T2 hyperintensity throughout the right lobe of the liver correspond to relatively diffuse arterial phase hyperenhancement on post gadolinium images which decreased on subsequent post gadolinium delayed imaging. Markedly heterogeneous perfusion is evident throughout the posterior aspect of the right lobe of the liver, predominantly segments 7 and 6, suggesting widespread infiltrative poorly defined neoplasm in these regions (although some of this could be accounted for by the altered perfusion throughout this portion of the liver related to portal and hepatic venous obstruction). The right branches of  the portal vein appear dilated and do not demonstrate normal portal venous enhancement, with abrupt truncation of the right main portal vein best appreciated on axial image 42 of series 19), suggesting extensive tumor thrombus in the portal venous system of the right lobe of the liver. There is also incomplete opacification of the right hepatic vein (axial image 36 of series 19), suggesting additional tumor thrombus in the right hepatic vein. No intra or extrahepatic biliary ductal dilatation. Gallbladder is moderately distended with diffuse gallbladder wall edema, but no overt surrounding inflammatory changes. No filling defects in the gallbladder to suggest cholelithiasis. Pancreas: No pancreatic mass. No pancreatic ductal dilatation. No pancreatic or peripancreatic fluid collections or inflammatory changes. Spleen: Spleen appears borderline enlarged measuring 11.3 x 5.7 x 11.2 cm (estimated splenic volume of 361 mL) . Adrenals/Urinary Tract: Bilateral kidneys and adrenal glands are normal in appearance. No hydroureteronephrosis in the visualized portions of the abdomen. Stomach/Bowel: Visualized portions are unremarkable. Vascular/Lymphatic: No aneurysm identified in the  visualized abdominal vasculature. Probable tumor thrombus in the right portal and hepatic venous systems, as detailed above. Numerous serpiginous densities adjacent to the proximal stomach and distal esophagus, compatible with varices. No definite lymphadenopathy confidently identified in the abdomen. Other: No significant volume of ascites noted in the visualized portions of the peritoneal cavity. Musculoskeletal: No aggressive appearing osseous lesions are noted in the visualized portions of the skeleton. IMPRESSION: 1. Cirrhosis with probable diffusely infiltrative hepatocellular carcinoma throughout the right lobe of the liver, including a more well encapsulated area in segment 7 near the dome. This appears to extend out of the hepatic capsule invading the right hemidiaphragm which is enlarged, and potentially invading the lower right hemithorax with possible involvement of the inferior aspect of the right lower lobe. Trace right pleural effusion is likely malignant. 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 2. Multiple small pulmonary nodules scattered throughout the visualize lung bases compatible with widespread metastatic disease to the lungs, better demonstrated on chest CT 06/30/2019. 3. Moderate dilatation of the gallbladder with diffuse gallbladder wall edema. However, there are no gallstones and there is no pericholecystic fluid. This likely reflects gallbladder wall edema from intrinsic hepatic disease, and is not favored to indicate an acute cholecystitis at this time. 4. Borderline splenomegaly. 5. 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 Electronically Signed   By: Vinnie Langton M.D.   On: 06/30/2019 13:12   ECHOCARDIOGRAM COMPLETE  Result Date: 06/30/2019    ECHOCARDIOGRAM REPORT   Patient Name:   Valerie [name] Date of Exam: 06/30/2019 Medical Rec #:  [id number]    Height:       62.0 in Accession #:    [id number]   Weight:       125.6 lb  Date of Birth:  April 23, 1965    BSA:          1.569 m Patient Age:    32 years     BP:           147/95 mmHg Patient Gender: F            HR:           71 bpm. Exam Location:  Inpatient Procedure: 2D Echo, Cardiac Doppler and Color Doppler Indications:    Pulmonary hypertension 416.8/I27.2  History:        Patient has no prior history of Echocardiogram examinations.                 Risk  Factors:Current Smoker. Thrombocytopenia. Liver cirrhosis.                 SIRS. Hepatitis C. Marijuana abuse.  Sonographer:    Clayton Lefort RDCS (AE) Referring Phys: [id number] [place]  Sonographer Comments: Patient movement. Echocardiogram ended before protocol complete due to patient stating she could not tolerate laying on back or left side. Nurse notified. IMPRESSIONS  1. Limited images, incomplete study.  2. Left ventricular ejection fraction, by estimation, is 60 to 65%. The left ventricle has normal function. The left ventricle has no regional wall motion abnormalities. Left ventricular diastolic function could not be evaluated.  3. Right ventricular systolic function is normal. The right ventricular size is normal. Tricuspid regurgitation signal is inadequate for assessing PA pressure.  4. The mitral valve is grossly normal. Trivial mitral valve regurgitation.  5. The aortic valve is tricuspid. Aortic valve regurgitation is not visualized. FINDINGS  Left Ventricle: Left ventricular ejection fraction, by estimation, is 60 to 65%. The left ventricle has normal function. The left ventricle has no regional wall motion abnormalities. The left ventricular internal cavity size was normal in size. There is  borderline left ventricular hypertrophy. Left ventricular diastolic function could not be evaluated. Right Ventricle: The right ventricular size is normal. No increase in right ventricular wall thickness. Right ventricular systolic function is normal. Tricuspid regurgitation signal is inadequate for assessing PA pressure. Left  Atrium: Left atrial size was normal in size. Right Atrium: Right atrial size was normal in size. Pericardium: There is no evidence of pericardial effusion. Mitral Valve: The mitral valve is grossly normal. Trivial mitral valve regurgitation. Tricuspid Valve: The tricuspid valve is grossly normal. Tricuspid valve regurgitation is mild. Aortic Valve: The aortic valve is tricuspid. Aortic valve regurgitation is not visualized. Mild aortic valve annular calcification. Pulmonic Valve: The pulmonic valve was grossly normal. Pulmonic valve regurgitation is mild. Aorta: The aortic root is normal in size and structure. Venous: The inferior vena cava was not well visualized. IAS/Shunts: No atrial level shunt detected by color flow Doppler.  LEFT VENTRICLE PLAX 2D LVIDd:         4.90 cm LVIDs:         3.10 cm LV PW:         1.10 cm LV IVS:        0.90 cm LVOT diam:     1.80 cm LVOT Area:     2.54 cm  LEFT ATRIUM         Index LA diam:    3.60 cm 2.29 cm/m   AORTA Ao Root diam: 2.60 cm Ao Asc diam:  3.20 cm TRICUSPID VALVE TR Peak grad:   22.1 mmHg TR Vmax:        235.00 cm/s  SHUNTS Systemic Diam: 1.80 cm Rozann Lesches MD Electronically signed by Rozann Lesches MD Signature Date/Time: 06/30/2019/11:31:13 AM    Final         Scheduled Meds: . feeding supplement (ENSURE ENLIVE)  237 mL Oral BID BM  . metoprolol tartrate  5 mg Intravenous Q6H   Continuous Infusions: . dextrose 5 % and 0.9% NaCl 75 mL/hr at 07/01/19 0655  . heparin 1,150 Units/hr (07/01/19 0910)  . methocarbamol (ROBAXIN) IV       LOS: 2 days    Time spent:40 min    Maurico Perrell, Geraldo Docker, MD [facility] Pager [phone]  If 7PM-7AM, please contact night-coverage [email or web address] Password [facility] 07/01/2019, 12:33 PM

## 2019-07-01 NOTE — Progress Notes (Addendum)
ANTICOAGULATION CONSULT NOTE - Initial Consult  Pharmacy Consult for: Heparin Indication:  VTE Treatment (Tumor thrombus throughout the hepatic system)   Patient Measurements: Height: 5\' 2"  (157.5 cm) Weight: 127 lb 9.6 oz (57.9 kg) IBW/kg (Calculated) : 50.1 Heparin Dosing Weight: 57 kg  Vital Signs: Temp: 99.3 F (37.4 C) (03/22 0437) Temp Source: Oral (03/22 0437) BP: 135/87 (03/22 0437) Pulse Rate: 86 (03/22 0437)  Labs: Recent Labs    06/28/19 2123 06/28/19 2123 06/28/19 2249 06/29/19 0418 06/29/19 0418 06/29/19 1343 06/30/19 0412 06/30/19 2119 07/01/19 0500  HGB 11.7*   < >  --  10.6*   < >  --  11.4*  --  10.9*  HCT 34.8*   < >  --  31.2*  --   --  33.9*  --  32.5*  PLT 108*   < >  --  97*  --   --  114*  --  127*  APTT 32  --   --   --   --  32  --   --   --   LABPROT 14.1  --   --   --   --  14.7  --   --   --   INR 1.1  --   --   --   --  1.2  --   --   --   HEPARINUNFRC  --   --   --   --   --   --   --  0.19* 0.30  CREATININE 0.67   < >  --  0.65  --   --  0.65  --  0.67  TROPONINIHS 6  --  8  --   --   --   --   --   --    < > = values in this interval not displayed.    Estimated Creatinine Clearance: 64.3 mL/min (by C-G formula based on SCr of 0.67 mg/dL).   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 Phramacy has been consulted to dose heparin. Patient is not on anticoag PTA.  HL now at low end of therapeutic. H/H, plt stable. Will increase infusion slightly given high clot burden.   Goal of Therapy:  Heparin level 0.3-0.7 units/ml Monitor platelets by anticoagulation protocol: Yes   Plan:  Increase heparin infusion to 1150 units/hr 6 hr confirmatory HL Monitor daily HL, CBC/plt Monitor for signs/symptoms of bleeding  F/u long term anticoagulation plan   Benetta Spar, PharmD, BCPS,  [place] Pharmacist  Please check AMION for all [place] phone numbers After 10:00 PM, call [place]

## 2019-07-01 NOTE — Consult Note (Addendum)
[place]  [phone] [phone]   MEDICAL ONCOLOGY - INITIAL CONSULTATION  Referral MD: Dr. Dia Crawford  Reason for Referral: Liver and pulmonary lesions concerning for metastatic cancer  HPI: Ms. Menees is a 54 year old female with a past medical history significant for cirrhosis, hepatitis C previously treated with interferon and ribavirin x16 weeks, pseudoseizures, and bipolar disorder.  She presented to the emergency room with persistent abdominal pain, nausea, vomiting, diarrhea x3 weeks.  She was also having a headache radiating to her neck.  On admission, she was febrile with a fever of 101.5.  She had a hemoglobin 11.7 and a platelet count of 108,000.  Her AST was 187, ALT 62, alkaline phosphatase 238, total bilirubin 1.5.  An AFP was drawn on 06/29/2019 and is currently pending.  The patient initially had an ultrasound of the abdomen which showed heterogeneous 4.6 cm mass in the posterior right liver concerning for hepatocellular carcinoma given her background cirrhosis.  He had a CT of the abdomen pelvis with contrast performed on 06/28/2018 which showed probable liver cirrhosis, large heterogeneous mass measuring at least 8.8 cm probably arising from the right hepatic lobe of the liver but extending cephalad toward the right lung base with possible violation of the capsule and invasion/involvement of the right diaphragm, there is at least 1 additional smaller mass in the inferior right hepatic lobe, numerous pulmonary nodules.  She had an MRI of the abdomen with and without contrast performed on 06/29/2019 which showed cirrhosis with probably diffusely infiltrative hepatocellular carcinoma throughout the right lobe of the liver which appears to extend out the hepatic capsule invading the right hemidiaphragm which is enlarged and potentially invading the lower right hemithorax with possible involvement of the inferior aspect of the right lower lobe.  A CT of the  chest without contrast was performed on 06/30/2019 which showed multiple pulmonary nodules highly concerning for widespread metastatic disease to the lung, and a large right lobe of the liver mass with transdiaphragmatic invasion into the lower right hemithorax involving portions of the right lower lobe.  Today, the patient reports that her biggest issue is neck pain with pain in her occipital area.  Reports that the pain is worsening.  She is also having significant shoulder, right upper quadrant, right flank, and right sided chest pain.  The patient reports anorexia and that her food tastes "metallic".  She reports a weight loss of 40 pounds since Christmas 2020.  She reports that she has been having abdominal pain, nausea, vomiting, and diarrhea for about 1 month.  Since she has been in the hospital, her abdominal pain has continued and she has some mild nausea but she has no lower vomiting.  She reports that the diarrhea is ongoing.  She denies dizziness.  No recent fevers or chills.  Denies shortness of breath but does have an intermittent productive cough.  Denies hemoptysis.  She has not noticed any lower extremity edema.  Denies bleeding such as epistaxis, hemoptysis, hematemesis, hematuria, melena, hematochezia.  The patient has indicated to me that she does not have a history of liver cancer as documented in her chart.  We discussed that there is a mention that she received radiation, but she describes that she received "radiation" for her hepatitis C.  When asked to describe this further, she states that she took shots with pills for her hepatitis C for about 16 weeks.  She was familiar with the the drugs interferon or ribavirin and states that  this is what she took.  The patient is separated and has 6 children.  Denies current alcohol use.  She currently smokes half pack of cigarettes per day and started smoking in her teens.  Medical oncology was asked to the patient make recommendations regarding her  liver and pulmonary lesions.    Past Medical History:  Diagnosis Date  . Bipolar disorder ([place])   . Hepatitis C   . Liver cirrhosis ([place])   . Pseudoseizures   : .  G6, P6  Past Surgical History:  Procedure Laterality Date  . CESAREAN SECTION    :  Current Facility-Administered Medications  Medication Dose Route Frequency Provider Last Rate Last Admin  . dextrose 5 %-0.9 % sodium chloride infusion   Intravenous Continuous Allie Bossier, MD 75 mL/hr at 07/01/19 0655 New Bag at 07/01/19 0655  . feeding supplement (ENSURE ENLIVE) (ENSURE ENLIVE) liquid 237 mL  237 mL Oral BID BM Allie Bossier, MD   237 mL at 06/30/19 1025  . fentaNYL (SUBLIMAZE) injection 25 mcg  25 mcg Intravenous Q3H PRN Allie Bossier, MD   25 mcg at 07/01/19 0551  . heparin ADULT infusion 100 units/mL (25000 units/230mL sodium chloride 0.45%)  1,150 Units/hr Intravenous Continuous Donnamae Jude, RPH 11.5 mL/hr at 07/01/19 0910 1,150 Units/hr at 07/01/19 0910  . hydrALAZINE (APRESOLINE) injection 5 mg  5 mg Intravenous Q6H PRN Allie Bossier, MD      . methocarbamol (ROBAXIN) 500 mg in dextrose 5 % 50 mL IVPB  500 mg Intravenous Q8H PRN Allie Bossier, MD      . metoprolol tartrate (LOPRESSOR) injection 5 mg  5 mg Intravenous Q6H Allie Bossier, MD   5 mg at 07/01/19 0541  . ondansetron (ZOFRAN) tablet 4 mg  4 mg Oral Q6H PRN Rise Patience, MD       Or  . ondansetron [facility]) injection 4 mg  4 mg Intravenous Q6H PRN Rise Patience, MD   4 mg at 06/30/19 1712  . traMADol (ULTRAM) tablet 50 mg  50 mg Oral Q6H PRN Rise Patience, MD   50 mg at 06/30/19 2309     Allergies  Allergen Reactions  . Norco [Hydrocodone-Acetaminophen] Anaphylaxis and Shortness Of Breath  . Tylenol [Acetaminophen] Other (See Comments)    Cannot take due to liver  :  Family History  Problem Relation Age of Onset  . Heart disease Neg Hx   :  Social History   Socioeconomic History  . Marital status: Unknown     Spouse name: Not on file  . Number of children: 6  . Years of education: Not on file  . Highest education level: Not on file  Occupational History  . Not on file  Tobacco Use  . Smoking status: Current Every Day Smoker    Packs/day: 0.50    Types: Cigarettes  . Smokeless tobacco: Never Used  Substance and Sexual Activity  . Alcohol use: No  . Drug use: No  . Sexual activity: Not on file  Other Topics Concern  . Not on file  Social History Narrative  . Not on file   Social Determinants of Health   Financial Resource Strain:   . Difficulty of Paying Living Expenses:   Food Insecurity:   . Worried About Charity fundraiser in the Last Year:   . Arboriculturist in the Last Year:   Transportation Needs:   . Film/video editor (Medical):   Marland Kitchen  Lack of Transportation (Non-Medical):   Physical Activity:   . Days of Exercise per Week:   . Minutes of Exercise per Session:   Stress:   . Feeling of Stress :   Social Connections:   . Frequency of Communication with Friends and Family:   . Frequency of Social Gatherings with Friends and Family:   . Attends Religious Services:   . Active Member of Clubs or Organizations:   . Attends Archivist Meetings:   Marland Kitchen Marital Status:   Intimate Partner Violence:   . Fear of Current or Ex-Partner:   . Emotionally Abused:   Marland Kitchen Physically Abused:   . Sexually Abused:   :  Review of Systems: A comprehensive 14 point review of systems was negative except as noted in the HPI.  Exam: Patient Vitals for the past 24 hrs:  BP Temp Temp src Pulse Resp SpO2 Weight  07/01/19 0437 135/87 99.3 F (37.4 C) Oral 86 16 93 % 57.9 kg  06/30/19 2103 (!) 155/87 98.7 F (37.1 C) Oral 86 16 100 % --  06/30/19 1200 137/88 98.6 F (37 C) Oral 72 20 99 % --    General: Chronically ill-appearing female, cachectic, no distress Eyes:  no scleral icterus.   ENT: Poor dentition, there were no oropharyngeal lesions.    Lymphatics:  Negative  cervical, supraclavicular, inguinal or axillary adenopathy.   Respiratory: lungs were clear bilaterally without wheezing or crackles.   Cardiovascular:  Regular rate and rhythm, S1/S2, without murmur, rub or gallop.  There was no pedal edema.   GI: Positive bowel sounds, nondistended, tenderness in the right upper quadrant, no mass Musculoskeletal: Moves all extremities x4 Skin exam was without echymosis, petichae.   Neuro exam was nonfocal. Patient was alert and oriented.  Attention was good.   Language was appropriate.  Mood was normal without depression.  Speech was not pressured.  Thought content was not tangential.     Lab Results  Component Value Date   WBC 6.1 07/01/2019   HGB 10.9 (L) 07/01/2019   HCT 32.5 (L) 07/01/2019   PLT 127 (L) 07/01/2019   GLUCOSE 101 (H) 07/01/2019   ALT 52 (H) 07/01/2019   AST 142 (H) 07/01/2019   NA 135 07/01/2019   K 4.0 07/01/2019   CL 105 07/01/2019   CREATININE 0.67 07/01/2019   BUN 9 07/01/2019   CO2 22 07/01/2019    DG Chest 2 View  Result Date: 06/21/2019 CLINICAL DATA:  Chest pain EXAM: CHEST - 2 VIEW COMPARISON:  None. FINDINGS: Cardiac shadow is within normal limits. Mild right basilar atelectasis is noted. No effusion is noted. No bony abnormality is seen. Aortic calcifications are noted. IMPRESSION: Mild right basilar atelectasis. Electronically Signed   By: Inez Catalina M.D.   On: 06/21/2019 22:05   CT HEAD WO CONTRAST  Result Date: 06/29/2019 CLINICAL DATA:  Headache, intracranial hemorrhage suspected. Additional history provided: Diffuse headache with pain radiating into neck ongoing for days. EXAM: CT HEAD WITHOUT CONTRAST TECHNIQUE: Contiguous axial images were obtained from the base of the skull through the vertex without intravenous contrast. COMPARISON:  Head CT 07/03/2017, brain MRI 11/27/2016 FINDINGS: Brain: There is no evidence of acute intracranial hemorrhage, intracranial mass, midline shift or extra-axial fluid  collection.No demarcated cortical infarction. Cerebral volume is normal for age. Vascular: No hyperdense vessel.  Atherosclerotic calcifications Skull: Normal. Negative for fracture or focal lesion. Sinuses/Orbits: Visualized orbits demonstrate no acute abnormality. No significant paranasal sinus disease or mastoid effusion  at the imaged levels. IMPRESSION: No evidence of acute intracranial abnormality. Specifically, no evidence of acute intracranial hemorrhage. Electronically Signed   By: Kellie Simmering DO   On: 06/29/2019 08:25   CT CHEST WO CONTRAST  Result Date: 06/30/2019 CLINICAL DATA:  54 year old female with history of cancer of unknown primary origin. EXAM: CT CHEST WITHOUT CONTRAST TECHNIQUE: Multidetector CT imaging of the chest was performed following the standard protocol without IV contrast. COMPARISON:  No prior chest CT. CT the abdomen and pelvis 06/28/2019. FINDINGS: Cardiovascular: Heart size is normal. There is no significant pericardial fluid, thickening or pericardial calcification. There is aortic atherosclerosis, as well as atherosclerosis of the great vessels of the mediastinum and the coronary arteries, including calcified atherosclerotic plaque in the left main and left anterior descending coronary arteries. Mediastinum/Nodes: Multiple prominent borderline enlarged mediastinal lymph nodes measuring up to 9 mm in short axis, nonspecific. Esophagus is unremarkable in appearance. No axillary lymphadenopathy. Lungs/Pleura: Multiple pulmonary nodules are again noted throughout the lungs bilaterally, concerning for widespread metastatic disease. These nodules generally range from 2-8 mm in size. In addition, at the right lung base in the right lower lobe there is a mass-like area which appears related to direct invasion across the right hemidiaphragm from large liver mass, better demonstrated on prior CT the abdomen and pelvis 06/28/2019 (see that report for full details). Trace volume of right  pleural fluid lying dependently. No left pleural effusion. Upper Abdomen: Large mass in the right lobe of the liver poorly demonstrated on today's noncontrast CT examination. See report from contemporaneously obtained abdominal MRI 06/29/2019 for full description of findings beneath the diaphragm. Musculoskeletal: There are no aggressive appearing lytic or blastic lesions noted in the visualized portions of the skeleton. IMPRESSION: 1. Multiple pulmonary nodules highly concerning for widespread metastatic disease to the lungs. 2. Large right lobe of the liver mass with trans diaphragmatic invasion into the lower right hemithorax involving portions of the right lower lobe. 3. Aortic atherosclerosis, in addition to left main and left anterior descending coronary artery disease. Please note that although the presence of coronary artery calcium documents the presence of coronary artery disease, the severity of this disease and any potential stenosis cannot be assessed on this non-gated CT examination. Assessment for potential risk factor modification, dietary therapy or pharmacologic therapy may be warranted, if clinically indicated. Aortic Atherosclerosis (ICD10-I70.0). Electronically Signed   By: Vinnie Langton M.D.   On: 06/30/2019 12:48   MR ABDOMEN W WO CONTRAST  Result Date: 06/30/2019 CLINICAL DATA:  54 year old female with liver lesion noted on prior CT examination. Follow-up study. EXAM: MRI ABDOMEN WITHOUT AND WITH CONTRAST TECHNIQUE: Multiplanar multisequence MR imaging of the abdomen was performed both before and after the administration of intravenous contrast. CONTRAST:  5.49mL GADAVIST GADOBUTROL 1 MMOL/ML IV SOLN COMPARISON:  No prior abdominal MRI. CT the abdomen and pelvis 06/28/2019. FINDINGS: Comment: Portions of today's examination are limited by extensive patient respiratory motion. Lower chest: Trace right pleural effusion lying dependently. Numerous small areas of increased signal intensity  corresponding to pulmonary nodules noted throughout the visualize lung bases. Hepatobiliary: Liver has a slightly shrunken appearance and nodular contour, indicative of underlying cirrhosis. There is heterogeneous signal intensity throughout the liver, most evident in the right lobe of the liver where there are widespread somewhat ill-defined areas of mild T2 hyperintensity and T1 hypointensity. In the superior aspect of segment 7 of the liver there is a well-defined mass (axial image 9 of series 10 and coronal  image 9 of series 4) measuring 4.4 x 4.4 x 4.1 cm, which demonstrates heterogeneous signal intensity on T1 and T2 weighted images, but generally has a peripheral rim of T2 hypointensity, and demonstrates heterogeneous internal enhancement on post gadolinium images without a specific enhancement pattern. Extending cephalad from this lesion there is additional irregular malignant appearing soft tissue which appears to directly invade the right hemidiaphragm best appreciated on coronal image 10 of series 4 and axial image 6 of series 5 where this is estimated to measure approximately 8.6 x 7.2 x 3.3 cm. This may also directly invade the inferior aspect of the right lower lobe, although this is difficult to say for certain on today's magnetic resonance examination. Notably, the ill-defined areas of T2 hyperintensity throughout the right lobe of the liver correspond to relatively diffuse arterial phase hyperenhancement on post gadolinium images which decreased on subsequent post gadolinium delayed imaging. Markedly heterogeneous perfusion is evident throughout the posterior aspect of the right lobe of the liver, predominantly segments 7 and 6, suggesting widespread infiltrative poorly defined neoplasm in these regions (although some of this could be accounted for by the altered perfusion throughout this portion of the liver related to portal and hepatic venous obstruction). The right branches of the portal vein  appear dilated and do not demonstrate normal portal venous enhancement, with abrupt truncation of the right main portal vein best appreciated on axial image 42 of series 19), suggesting extensive tumor thrombus in the portal venous system of the right lobe of the liver. There is also incomplete opacification of the right hepatic vein (axial image 36 of series 19), suggesting additional tumor thrombus in the right hepatic vein. No intra or extrahepatic biliary ductal dilatation. Gallbladder is moderately distended with diffuse gallbladder wall edema, but no overt surrounding inflammatory changes. No filling defects in the gallbladder to suggest cholelithiasis. Pancreas: No pancreatic mass. No pancreatic ductal dilatation. No pancreatic or peripancreatic fluid collections or inflammatory changes. Spleen: Spleen appears borderline enlarged measuring 11.3 x 5.7 x 11.2 cm (estimated splenic volume of 361 mL) . Adrenals/Urinary Tract: Bilateral kidneys and adrenal glands are normal in appearance. No hydroureteronephrosis in the visualized portions of the abdomen. Stomach/Bowel: Visualized portions are unremarkable. Vascular/Lymphatic: No aneurysm identified in the visualized abdominal vasculature. Probable tumor thrombus in the right portal and hepatic venous systems, as detailed above. Numerous serpiginous densities adjacent to the proximal stomach and distal esophagus, compatible with varices. No definite lymphadenopathy confidently identified in the abdomen. Other: No significant volume of ascites noted in the visualized portions of the peritoneal cavity. Musculoskeletal: No aggressive appearing osseous lesions are noted in the visualized portions of the skeleton. IMPRESSION: 1. Cirrhosis with probable diffusely infiltrative hepatocellular carcinoma throughout the right lobe of the liver, including a more well encapsulated area in segment 7 near the dome. This appears to extend out of the hepatic capsule invading the  right hemidiaphragm which is enlarged, and potentially invading the lower right hemithorax with possible involvement of the inferior aspect of the right lower lobe. Trace right pleural effusion is likely malignant. 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 2. Multiple small pulmonary nodules scattered throughout the visualize lung bases compatible with widespread metastatic disease to the lungs, better demonstrated on chest CT 06/30/2019. 3. Moderate dilatation of the gallbladder with diffuse gallbladder wall edema. However, there are no gallstones and there is no pericholecystic fluid. This likely reflects gallbladder wall edema from intrinsic hepatic disease, and is not favored to indicate  an acute cholecystitis at this time. 4. Borderline splenomegaly. 5. 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 Electronically Signed   By: Vinnie Langton M.D.   On: 06/30/2019 13:12   CT ABDOMEN PELVIS W CONTRAST  Result Date: 06/28/2019 CLINICAL DATA:  Right lower quadrant pain EXAM: CT ABDOMEN AND PELVIS WITH CONTRAST TECHNIQUE: Multidetector CT imaging of the abdomen and pelvis was performed using the standard protocol following bolus administration of intravenous contrast. CONTRAST:  124mL OMNIPAQUE IOHEXOL 300 MG/ML  SOLN COMPARISON:  Ultrasound 06/28/2019 FINDINGS: Lower chest: Lung bases demonstrate numerous pulmonary nodules measuring up to 8 mm in size concerning for metastatic disease. Heart size within normal limits. Hepatobiliary: Heterogenous liver. Subtle contour nodularity suggesting cirrhosis. No calcified gallstone. Small amount of pericholecystic fluid or wall thickening though normal appearance of the gallbladder by sonography. Large heterogenous mass that appears to be within the posterior right hepatic lobe. This measures approximately 8.2 x 6.9 by 8.8 cm and contains calcifications. The mass may invade the right diaphragm given its appearance on  coronal views. Additional smaller mass measuring 2.9 cm, series 3, image number 28 more inferiorly within the right hepatic lobe. Suspect that there may be additional vague hypodense masses in the right lobe. Pancreas: Unremarkable. No pancreatic ductal dilatation or surrounding inflammatory changes. Spleen: Borderline enlarged. Adrenals/Urinary Tract: Left adrenal gland is normal. Questionable involvement of right adrenal gland by the hepatic mass lesion, series 3, image number 21. Kidneys show no hydronephrosis. The bladder is normal Stomach/Bowel: Stomach nonenlarged. No dilated small bowel. Diverticular disease of the colon. Possible mild wall thickening at the cecum and ascending colon. The appendix is negative. Vascular/Lymphatic: Moderate aortic atherosclerosis. No aneurysm. Small porta hepatis and gastrohepatic nodes measuring up to 16 mm in size, series 3, image number 30. Reproductive: Uterus and bilateral adnexa are unremarkable. Other: No free air. Small amount of ascites within the abdomen and pelvis. Diffuse hazy appearance of the mesentery. Musculoskeletal: Bones appear slightly dense but are without focal lytic or focal sclerotic process. IMPRESSION: 1. Probable liver cirrhosis. Large heterogeneous suspicion mass measuring at least 8.8 cm, probably arising from the right hepatic lobe of the liver but extending cephalad toward the right lung base with possible violation of the capsule and invasion/involvement of the right diaphragm. At least 1 additional smaller solid mass in the inferior right hepatic lobe either representing metastatic disease or multifocal disease. Correlation with MRI should be considered. 2. Numerous pulmonary nodules consistent with metastatic disease 3. Possible gallbladder wall thickening or small amount of pericholecystic fluid as seen on recent ultrasound 4. Diffuse diverticular disease of the colon with possible wall thickening/mild acute inflammation at the cecum and  ascending colon. 5. Small amount of free fluid in the pelvis. Electronically Signed   By: Donavan Foil M.D.   On: 06/28/2019 23:41   DG Chest Port 1 View  Result Date: 06/28/2019 CLINICAL DATA:  Cough and shortness of breath. EXAM: PORTABLE CHEST 1 VIEW COMPARISON:  Aug 21, 2019 FINDINGS: The cardiac silhouette is enlarged. There is vascular congestion with developing pulmonary edema. There are small bilateral pleural effusions. Aortic calcifications are noted. Bibasilar atelectasis is noted. There is no acute osseous abnormality. IMPRESSION: Findings suggestive of congestive heart failure. Electronically Signed   By: Constance Holster M.D.   On: 06/28/2019 21:43   ECHOCARDIOGRAM COMPLETE  Result Date: 06/30/2019    ECHOCARDIOGRAM REPORT   Patient Name:   LENORA GOMES Date of Exam: 06/30/2019 Medical Rec #:  [id number]  Height:       62.0 in Accession #:    [id number]   Weight:       125.6 lb Date of Birth:  05-May-1965    BSA:          1.569 m Patient Age:    32 years     BP:           147/95 mmHg Patient Gender: F            HR:           71 bpm. Exam Location:  Inpatient Procedure: 2D Echo, Cardiac Doppler and Color Doppler Indications:    Pulmonary hypertension 416.8/I27.2  History:        Patient has no prior history of Echocardiogram examinations.                 Risk Factors:Current Smoker. Thrombocytopenia. Liver cirrhosis.                 SIRS. Hepatitis C. Marijuana abuse.  Sonographer:    Clayton Lefort RDCS (AE) Referring Phys: [id number] [place]  Sonographer Comments: Patient movement. Echocardiogram ended before protocol complete due to patient stating she could not tolerate laying on back or left side. Nurse notified. IMPRESSIONS  1. Limited images, incomplete study.  2. Left ventricular ejection fraction, by estimation, is 60 to 65%. The left ventricle has normal function. The left ventricle has no regional wall motion abnormalities. Left ventricular diastolic function could not be  evaluated.  3. Right ventricular systolic function is normal. The right ventricular size is normal. Tricuspid regurgitation signal is inadequate for assessing PA pressure.  4. The mitral valve is grossly normal. Trivial mitral valve regurgitation.  5. The aortic valve is tricuspid. Aortic valve regurgitation is not visualized. FINDINGS  Left Ventricle: Left ventricular ejection fraction, by estimation, is 60 to 65%. The left ventricle has normal function. The left ventricle has no regional wall motion abnormalities. The left ventricular internal cavity size was normal in size. There is  borderline left ventricular hypertrophy. Left ventricular diastolic function could not be evaluated. Right Ventricle: The right ventricular size is normal. No increase in right ventricular wall thickness. Right ventricular systolic function is normal. Tricuspid regurgitation signal is inadequate for assessing PA pressure. Left Atrium: Left atrial size was normal in size. Right Atrium: Right atrial size was normal in size. Pericardium: There is no evidence of pericardial effusion. Mitral Valve: The mitral valve is grossly normal. Trivial mitral valve regurgitation. Tricuspid Valve: The tricuspid valve is grossly normal. Tricuspid valve regurgitation is mild. Aortic Valve: The aortic valve is tricuspid. Aortic valve regurgitation is not visualized. Mild aortic valve annular calcification. Pulmonic Valve: The pulmonic valve was grossly normal. Pulmonic valve regurgitation is mild. Aorta: The aortic root is normal in size and structure. Venous: The inferior vena cava was not well visualized. IAS/Shunts: No atrial level shunt detected by color flow Doppler.  LEFT VENTRICLE PLAX 2D LVIDd:         4.90 cm LVIDs:         3.10 cm LV PW:         1.10 cm LV IVS:        0.90 cm LVOT diam:     1.80 cm LVOT Area:     2.54 cm  LEFT ATRIUM         Index LA diam:    3.60 cm 2.29 cm/m   AORTA Ao Root diam: 2.60 cm Ao Asc  diam:  3.20 cm TRICUSPID  VALVE TR Peak grad:   22.1 mmHg TR Vmax:        235.00 cm/s  SHUNTS Systemic Diam: 1.80 cm Rozann Lesches MD Electronically signed by Rozann Lesches MD Signature Date/Time: 06/30/2019/11:31:13 AM    Final    US Abdomen Limited RUQ  Result Date: 06/28/2019 CLINICAL DATA:  Chest, right upper quadrant pain for 3 weeks, nausea, vomiting and fevers, history of cirrhosis EXAM: ULTRASOUND ABDOMEN LIMITED RIGHT UPPER QUADRANT COMPARISON:  None. FINDINGS: Gallbladder: Mildly contracted appearance of the gallbladder. No visible calcified gallstones, significant gallbladder wall thickening. Trace pericholecystic fluid. Sonographic Percell Miller sign is reportedly negative. Common bile duct: Diameter: 1.3 mm proximally, nondilated. Distal portion not visualized. Liver: There is a heterogeneous, centrally hyperechoic, peripherally hypoechoic lesion with a thin capsule in the right lobe liver measuring 3.9 x 4.0 x 4.6 cm. Coarsened heterogeneous background parenchymal echogenicity with a nodular hepatic surface contour compatible with history of cirrhosis. Portal vein is patent on color Doppler imaging with normal direction of blood flow towards the liver. Other: None. IMPRESSION: Under distension of the gallbladder. Trace pericholecystic fluid is nonspecific with absence of other features of acute cholecystitis. Should be considered within the appropriate clinical setting. Heterogeneous 4.6 cm mass in the posterior right lobe liver, highly concerning for hepatocellular carcinoma given background of cirrhosis. Consider further characterization with multiphase MRI or CT, preferably on an outpatient basis when patient is better able to tolerate scanning procedure. Electronically Signed   By: Lovena Le M.D.   On: 06/28/2019 22:16     DG Chest 2 View  Result Date: 06/21/2019 CLINICAL DATA:  Chest pain EXAM: CHEST - 2 VIEW COMPARISON:  None. FINDINGS: Cardiac shadow is within normal limits. Mild right basilar atelectasis is  noted. No effusion is noted. No bony abnormality is seen. Aortic calcifications are noted. IMPRESSION: Mild right basilar atelectasis. Electronically Signed   By: Inez Catalina M.D.   On: 06/21/2019 22:05   CT HEAD WO CONTRAST  Result Date: 06/29/2019 CLINICAL DATA:  Headache, intracranial hemorrhage suspected. Additional history provided: Diffuse headache with pain radiating into neck ongoing for days. EXAM: CT HEAD WITHOUT CONTRAST TECHNIQUE: Contiguous axial images were obtained from the base of the skull through the vertex without intravenous contrast. COMPARISON:  Head CT 07/03/2017, brain MRI 11/27/2016 FINDINGS: Brain: There is no evidence of acute intracranial hemorrhage, intracranial mass, midline shift or extra-axial fluid collection.No demarcated cortical infarction. Cerebral volume is normal for age. Vascular: No hyperdense vessel.  Atherosclerotic calcifications Skull: Normal. Negative for fracture or focal lesion. Sinuses/Orbits: Visualized orbits demonstrate no acute abnormality. No significant paranasal sinus disease or mastoid effusion at the imaged levels. IMPRESSION: No evidence of acute intracranial abnormality. Specifically, no evidence of acute intracranial hemorrhage. Electronically Signed   By: Kellie Simmering DO   On: 06/29/2019 08:25   CT CHEST WO CONTRAST  Result Date: 06/30/2019 CLINICAL DATA:  54 year old female with history of cancer of unknown primary origin. EXAM: CT CHEST WITHOUT CONTRAST TECHNIQUE: Multidetector CT imaging of the chest was performed following the standard protocol without IV contrast. COMPARISON:  No prior chest CT. CT the abdomen and pelvis 06/28/2019. FINDINGS: Cardiovascular: Heart size is normal. There is no significant pericardial fluid, thickening or pericardial calcification. There is aortic atherosclerosis, as well as atherosclerosis of the great vessels of the mediastinum and the coronary arteries, including calcified atherosclerotic plaque in the left  main and left anterior descending coronary arteries. Mediastinum/Nodes: Multiple prominent borderline enlarged mediastinal  lymph nodes measuring up to 9 mm in short axis, nonspecific. Esophagus is unremarkable in appearance. No axillary lymphadenopathy. Lungs/Pleura: Multiple pulmonary nodules are again noted throughout the lungs bilaterally, concerning for widespread metastatic disease. These nodules generally range from 2-8 mm in size. In addition, at the right lung base in the right lower lobe there is a mass-like area which appears related to direct invasion across the right hemidiaphragm from large liver mass, better demonstrated on prior CT the abdomen and pelvis 06/28/2019 (see that report for full details). Trace volume of right pleural fluid lying dependently. No left pleural effusion. Upper Abdomen: Large mass in the right lobe of the liver poorly demonstrated on today's noncontrast CT examination. See report from contemporaneously obtained abdominal MRI 06/29/2019 for full description of findings beneath the diaphragm. Musculoskeletal: There are no aggressive appearing lytic or blastic lesions noted in the visualized portions of the skeleton. IMPRESSION: 1. Multiple pulmonary nodules highly concerning for widespread metastatic disease to the lungs. 2. Large right lobe of the liver mass with trans diaphragmatic invasion into the lower right hemithorax involving portions of the right lower lobe. 3. Aortic atherosclerosis, in addition to left main and left anterior descending coronary artery disease. Please note that although the presence of coronary artery calcium documents the presence of coronary artery disease, the severity of this disease and any potential stenosis cannot be assessed on this non-gated CT examination. Assessment for potential risk factor modification, dietary therapy or pharmacologic therapy may be warranted, if clinically indicated. Aortic Atherosclerosis (ICD10-I70.0). Electronically  Signed   By: Vinnie Langton M.D.   On: 06/30/2019 12:48   MR ABDOMEN W WO CONTRAST  Result Date: 06/30/2019 CLINICAL DATA:  53 year old female with liver lesion noted on prior CT examination. Follow-up study. EXAM: MRI ABDOMEN WITHOUT AND WITH CONTRAST TECHNIQUE: Multiplanar multisequence MR imaging of the abdomen was performed both before and after the administration of intravenous contrast. CONTRAST:  5.71mL GADAVIST GADOBUTROL 1 MMOL/ML IV SOLN COMPARISON:  No prior abdominal MRI. CT the abdomen and pelvis 06/28/2019. FINDINGS: Comment: Portions of today's examination are limited by extensive patient respiratory motion. Lower chest: Trace right pleural effusion lying dependently. Numerous small areas of increased signal intensity corresponding to pulmonary nodules noted throughout the visualize lung bases. Hepatobiliary: Liver has a slightly shrunken appearance and nodular contour, indicative of underlying cirrhosis. There is heterogeneous signal intensity throughout the liver, most evident in the right lobe of the liver where there are widespread somewhat ill-defined areas of mild T2 hyperintensity and T1 hypointensity. In the superior aspect of segment 7 of the liver there is a well-defined mass (axial image 9 of series 10 and coronal image 9 of series 4) measuring 4.4 x 4.4 x 4.1 cm, which demonstrates heterogeneous signal intensity on T1 and T2 weighted images, but generally has a peripheral rim of T2 hypointensity, and demonstrates heterogeneous internal enhancement on post gadolinium images without a specific enhancement pattern. Extending cephalad from this lesion there is additional irregular malignant appearing soft tissue which appears to directly invade the right hemidiaphragm best appreciated on coronal image 10 of series 4 and axial image 6 of series 5 where this is estimated to measure approximately 8.6 x 7.2 x 3.3 cm. This may also directly invade the inferior aspect of the right lower lobe,  although this is difficult to say for certain on today's magnetic resonance examination. Notably, the ill-defined areas of T2 hyperintensity throughout the right lobe of the liver correspond to relatively diffuse arterial phase hyperenhancement  on post gadolinium images which decreased on subsequent post gadolinium delayed imaging. Markedly heterogeneous perfusion is evident throughout the posterior aspect of the right lobe of the liver, predominantly segments 7 and 6, suggesting widespread infiltrative poorly defined neoplasm in these regions (although some of this could be accounted for by the altered perfusion throughout this portion of the liver related to portal and hepatic venous obstruction). The right branches of the portal vein appear dilated and do not demonstrate normal portal venous enhancement, with abrupt truncation of the right main portal vein best appreciated on axial image 42 of series 19), suggesting extensive tumor thrombus in the portal venous system of the right lobe of the liver. There is also incomplete opacification of the right hepatic vein (axial image 36 of series 19), suggesting additional tumor thrombus in the right hepatic vein. No intra or extrahepatic biliary ductal dilatation. Gallbladder is moderately distended with diffuse gallbladder wall edema, but no overt surrounding inflammatory changes. No filling defects in the gallbladder to suggest cholelithiasis. Pancreas: No pancreatic mass. No pancreatic ductal dilatation. No pancreatic or peripancreatic fluid collections or inflammatory changes. Spleen: Spleen appears borderline enlarged measuring 11.3 x 5.7 x 11.2 cm (estimated splenic volume of 361 mL) . Adrenals/Urinary Tract: Bilateral kidneys and adrenal glands are normal in appearance. No hydroureteronephrosis in the visualized portions of the abdomen. Stomach/Bowel: Visualized portions are unremarkable. Vascular/Lymphatic: No aneurysm identified in the visualized abdominal  vasculature. Probable tumor thrombus in the right portal and hepatic venous systems, as detailed above. Numerous serpiginous densities adjacent to the proximal stomach and distal esophagus, compatible with varices. No definite lymphadenopathy confidently identified in the abdomen. Other: No significant volume of ascites noted in the visualized portions of the peritoneal cavity. Musculoskeletal: No aggressive appearing osseous lesions are noted in the visualized portions of the skeleton. IMPRESSION: 1. Cirrhosis with probable diffusely infiltrative hepatocellular carcinoma throughout the right lobe of the liver, including a more well encapsulated area in segment 7 near the dome. This appears to extend out of the hepatic capsule invading the right hemidiaphragm which is enlarged, and potentially invading the lower right hemithorax with possible involvement of the inferior aspect of the right lower lobe. Trace right pleural effusion is likely malignant. 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 2. Multiple small pulmonary nodules scattered throughout the visualize lung bases compatible with widespread metastatic disease to the lungs, better demonstrated on chest CT 06/30/2019. 3. Moderate dilatation of the gallbladder with diffuse gallbladder wall edema. However, there are no gallstones and there is no pericholecystic fluid. This likely reflects gallbladder wall edema from intrinsic hepatic disease, and is not favored to indicate an acute cholecystitis at this time. 4. Borderline splenomegaly. 5. 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 Electronically Signed   By: Vinnie Langton M.D.   On: 06/30/2019 13:12   CT ABDOMEN PELVIS W CONTRAST  Result Date: 06/28/2019 CLINICAL DATA:  Right lower quadrant pain EXAM: CT ABDOMEN AND PELVIS WITH CONTRAST TECHNIQUE: Multidetector CT imaging of the abdomen and pelvis was performed using the standard protocol following  bolus administration of intravenous contrast. CONTRAST:  138mL OMNIPAQUE IOHEXOL 300 MG/ML  SOLN COMPARISON:  Ultrasound 06/28/2019 FINDINGS: Lower chest: Lung bases demonstrate numerous pulmonary nodules measuring up to 8 mm in size concerning for metastatic disease. Heart size within normal limits. Hepatobiliary: Heterogenous liver. Subtle contour nodularity suggesting cirrhosis. No calcified gallstone. Small amount of pericholecystic fluid or wall thickening though normal appearance of the gallbladder by sonography. Large  heterogenous mass that appears to be within the posterior right hepatic lobe. This measures approximately 8.2 x 6.9 by 8.8 cm and contains calcifications. The mass may invade the right diaphragm given its appearance on coronal views. Additional smaller mass measuring 2.9 cm, series 3, image number 28 more inferiorly within the right hepatic lobe. Suspect that there may be additional vague hypodense masses in the right lobe. Pancreas: Unremarkable. No pancreatic ductal dilatation or surrounding inflammatory changes. Spleen: Borderline enlarged. Adrenals/Urinary Tract: Left adrenal gland is normal. Questionable involvement of right adrenal gland by the hepatic mass lesion, series 3, image number 21. Kidneys show no hydronephrosis. The bladder is normal Stomach/Bowel: Stomach nonenlarged. No dilated small bowel. Diverticular disease of the colon. Possible mild wall thickening at the cecum and ascending colon. The appendix is negative. Vascular/Lymphatic: Moderate aortic atherosclerosis. No aneurysm. Small porta hepatis and gastrohepatic nodes measuring up to 16 mm in size, series 3, image number 30. Reproductive: Uterus and bilateral adnexa are unremarkable. Other: No free air. Small amount of ascites within the abdomen and pelvis. Diffuse hazy appearance of the mesentery. Musculoskeletal: Bones appear slightly dense but are without focal lytic or focal sclerotic process. IMPRESSION: 1. Probable  liver cirrhosis. Large heterogeneous suspicion mass measuring at least 8.8 cm, probably arising from the right hepatic lobe of the liver but extending cephalad toward the right lung base with possible violation of the capsule and invasion/involvement of the right diaphragm. At least 1 additional smaller solid mass in the inferior right hepatic lobe either representing metastatic disease or multifocal disease. Correlation with MRI should be considered. 2. Numerous pulmonary nodules consistent with metastatic disease 3. Possible gallbladder wall thickening or small amount of pericholecystic fluid as seen on recent ultrasound 4. Diffuse diverticular disease of the colon with possible wall thickening/mild acute inflammation at the cecum and ascending colon. 5. Small amount of free fluid in the pelvis. Electronically Signed   By: Donavan Foil M.D.   On: 06/28/2019 23:41   DG Chest Port 1 View  Result Date: 06/28/2019 CLINICAL DATA:  Cough and shortness of breath. EXAM: PORTABLE CHEST 1 VIEW COMPARISON:  Aug 21, 2019 FINDINGS: The cardiac silhouette is enlarged. There is vascular congestion with developing pulmonary edema. There are small bilateral pleural effusions. Aortic calcifications are noted. Bibasilar atelectasis is noted. There is no acute osseous abnormality. IMPRESSION: Findings suggestive of congestive heart failure. Electronically Signed   By: Constance Holster M.D.   On: 06/28/2019 21:43   ECHOCARDIOGRAM COMPLETE  Result Date: 06/30/2019    ECHOCARDIOGRAM REPORT   Patient Name:   CIPRIANA BILLER Date of Exam: 06/30/2019 Medical Rec #:  [id number]    Height:       62.0 in Accession #:    [id number]   Weight:       125.6 lb Date of Birth:  10-May-1965    BSA:          1.569 m Patient Age:    57 years     BP:           147/95 mmHg Patient Gender: F            HR:           71 bpm. Exam Location:  Inpatient Procedure: 2D Echo, Cardiac Doppler and Color Doppler Indications:    Pulmonary hypertension  416.8/I27.2  History:        Patient has no prior history of Echocardiogram examinations.  Risk Factors:Current Smoker. Thrombocytopenia. Liver cirrhosis.                 SIRS. Hepatitis C. Marijuana abuse.  Sonographer:    Clayton Lefort RDCS (AE) Referring Phys: [id number] [place]  Sonographer Comments: Patient movement. Echocardiogram ended before protocol complete due to patient stating she could not tolerate laying on back or left side. Nurse notified. IMPRESSIONS  1. Limited images, incomplete study.  2. Left ventricular ejection fraction, by estimation, is 60 to 65%. The left ventricle has normal function. The left ventricle has no regional wall motion abnormalities. Left ventricular diastolic function could not be evaluated.  3. Right ventricular systolic function is normal. The right ventricular size is normal. Tricuspid regurgitation signal is inadequate for assessing PA pressure.  4. The mitral valve is grossly normal. Trivial mitral valve regurgitation.  5. The aortic valve is tricuspid. Aortic valve regurgitation is not visualized. FINDINGS  Left Ventricle: Left ventricular ejection fraction, by estimation, is 60 to 65%. The left ventricle has normal function. The left ventricle has no regional wall motion abnormalities. The left ventricular internal cavity size was normal in size. There is  borderline left ventricular hypertrophy. Left ventricular diastolic function could not be evaluated. Right Ventricle: The right ventricular size is normal. No increase in right ventricular wall thickness. Right ventricular systolic function is normal. Tricuspid regurgitation signal is inadequate for assessing PA pressure. Left Atrium: Left atrial size was normal in size. Right Atrium: Right atrial size was normal in size. Pericardium: There is no evidence of pericardial effusion. Mitral Valve: The mitral valve is grossly normal. Trivial mitral valve regurgitation. Tricuspid Valve: The tricuspid valve  is grossly normal. Tricuspid valve regurgitation is mild. Aortic Valve: The aortic valve is tricuspid. Aortic valve regurgitation is not visualized. Mild aortic valve annular calcification. Pulmonic Valve: The pulmonic valve was grossly normal. Pulmonic valve regurgitation is mild. Aorta: The aortic root is normal in size and structure. Venous: The inferior vena cava was not well visualized. IAS/Shunts: No atrial level shunt detected by color flow Doppler.  LEFT VENTRICLE PLAX 2D LVIDd:         4.90 cm LVIDs:         3.10 cm LV PW:         1.10 cm LV IVS:        0.90 cm LVOT diam:     1.80 cm LVOT Area:     2.54 cm  LEFT ATRIUM         Index LA diam:    3.60 cm 2.29 cm/m   AORTA Ao Root diam: 2.60 cm Ao Asc diam:  3.20 cm TRICUSPID VALVE TR Peak grad:   22.1 mmHg TR Vmax:        235.00 cm/s  SHUNTS Systemic Diam: 1.80 cm Rozann Lesches MD Electronically signed by Rozann Lesches MD Signature Date/Time: 06/30/2019/11:31:13 AM    Final    US Abdomen Limited RUQ  Result Date: 06/28/2019 CLINICAL DATA:  Chest, right upper quadrant pain for 3 weeks, nausea, vomiting and fevers, history of cirrhosis EXAM: ULTRASOUND ABDOMEN LIMITED RIGHT UPPER QUADRANT COMPARISON:  None. FINDINGS: Gallbladder: Mildly contracted appearance of the gallbladder. No visible calcified gallstones, significant gallbladder wall thickening. Trace pericholecystic fluid. Sonographic Percell Miller sign is reportedly negative. Common bile duct: Diameter: 1.3 mm proximally, nondilated. Distal portion not visualized. Liver: There is a heterogeneous, centrally hyperechoic, peripherally hypoechoic lesion with a thin capsule in the right lobe liver measuring 3.9 x 4.0 x 4.6 cm.  Coarsened heterogeneous background parenchymal echogenicity with a nodular hepatic surface contour compatible with history of cirrhosis. Portal vein is patent on color Doppler imaging with normal direction of blood flow towards the liver. Other: None. IMPRESSION: Under distension of  the gallbladder. Trace pericholecystic fluid is nonspecific with absence of other features of acute cholecystitis. Should be considered within the appropriate clinical setting. Heterogeneous 4.6 cm mass in the posterior right lobe liver, highly concerning for hepatocellular carcinoma given background of cirrhosis. Consider further characterization with multiphase MRI or CT, preferably on an outpatient basis when patient is better able to tolerate scanning procedure. Electronically Signed   By: Lovena Le M.D.   On: 06/28/2019 22:16    Assessment and Plan:  1.  Liver and lung lesions concerning for metastatic cancer, ?[place] primary  -CT 06/28/2019-probable cirrhosis, large heterogenous mass in the right liver extending to the right lung base with possible violation of the capsule and invasion of the right hemidiaphragm, at least 1 additional mass in the inferior right liver  -06/29/2019 MRI of the abdomen -probable diffusely infiltrative hepatocellular carcinoma throughout the right lobe of the liver which appears to extend out of the hepatic capsule and invade the right hemidiaphragm, tumor thrombus involving the right portal and hepatic veins, esophageal and proximal gastric varices -06/30/2019 CT of the chest without contrast -multiple pulmonary nodules concerning for widespread metastatic disease to the lungs, large right lobe liver mass with transdiaphragmatic invasion into the lower right hemithorax involving portions of the right lower lobe -07/01/2019 -AFP pending 2.  Hepatitis C -Reports prior treatment with 16 weeks of interferon ribavirin -06/29/2019 HCV RNA quantitative 634,000 3.  Cirrhosis 4.  Pseudoseizures 5.  Bipolar disorder 6.  Anemia and thrombocytopenia 7.  Elevated LFTs  Ms. Mcconahy presented to the hospital with abdominal pain, nausea, vomiting, diarrhea, occipital headache, and shoulder pain.  Imaging is concerning for metastatic cancer, likely metastatic hepatocellular carcinoma.   AFP pending. She has hepatitis C and cirrhosis.  She is noted to have mild anemia and thrombocytopenia on her lab work likely due to underlying liver disease.  She is also noted to have heme positive stools earlier today.  She has elevated LFTs and a mildly elevated total bilirubin likely due to her underlying cirrhosis and liver lesions.  1.  Work-up to date has been reviewed with the patient.  She understands that she may have metastatic cancer.  Will discuss further with Dr. Benay Spice this afternoon regarding need for additional work-up such as a biopsy and a bone scan which has been recommended by general surgery. 2.  Further discussion regarding possible treatment options to follow-up pending review by Dr. Benay Spice.  Thank you for this referral.   Mikey Bussing, DNP, AGPCNP-BC, AOCNP  Ms. Lacina was interviewed and examined.  CT and MRI images were reviewed.  She has a history of hepatitis C.  The clinical presentation and imaging studies are consistent with cirrhosis.  She appears to have advanced stage hepatocellular carcinoma. She understands no therapy will be curative if the diagnosis of metastatic hepatocellular carcinoma is confirmed.  She does not appear to be a candidate for surgery or hepatic directed therapy.  I will recommend systemic therapy, likely atezolizumab/bevacizumab.  The AFP and a staging bone scan are pending.  I suspect the pain in the upper chest/shoulder is related to referred plain from the liver and diaphragm.  There is tumor thrombus involving the portal vein and hepatic vein.  She has varices.  I do not recommend  anticoagulation therapy.  Recommendations:  1.  Follow-up AFP and decide on indication for a diagnostic biopsy 2.  Continue narcotic analgesics for pain, switch to an oral regimen 3.  I would discontinue anticoagulation therapy 4.  GI or infectious disease consultation to treat the hepatitis C infection 5.  Oncology will continue following her in the  hospital.  Outpatient follow-up will be scheduled at the Cancer center.

## 2019-07-01 NOTE — Progress Notes (Signed)
Subjective: CC: Complains of being cold and some ruq abdominal pain. Discussed with her the CT chest results as well as what her MRI showed.   Objective: Vital signs in last 24 hours: Temp:  [98.6 F (37 C)-99.3 F (37.4 C)] 99.3 F (37.4 C) (03/22 0437) Pulse Rate:  [72-86] 86 (03/22 0437) Resp:  [16-20] 16 (03/22 0437) BP: (135-155)/(87-88) 135/87 (03/22 0437) SpO2:  [93 %-100 %] 93 % (03/22 0437) Weight:  [57.9 kg] 57.9 kg (03/22 0437) Last BM Date: 06/28/19  Intake/Output from previous day: 03/21 0701 - 03/22 0700 In: 1646.1 [P.O.:790; I.V.:856.1] Out: 300 [Urine:300] Intake/Output this shift: Total I/O In: 440 [P.O.:440] Out: -   PE: Gen:  Alert, NAD, pleasant Card:  RRR Pulm:  CTAB, no W/R/R, effort normal Abd: Soft, mild tenderness of the RUQ, ND, +BS Psych: A&Ox3  Skin: no rashes noted, warm and dry   Lab Results:  Recent Labs    06/30/19 0412 07/01/19 0500  WBC 7.6 6.1  HGB 11.4* 10.9*  HCT 33.9* 32.5*  PLT 114* 127*   BMET Recent Labs    06/30/19 0412 07/01/19 0500  NA 135 135  K 4.0 4.0  CL 103 105  CO2 22 22  GLUCOSE 96 101*  BUN 6 9  CREATININE 0.65 0.67  CALCIUM 8.6* 8.3*   PT/INR Recent Labs    06/28/19 2123 06/29/19 1343  LABPROT 14.1 14.7  INR 1.1 1.2   CMP     Component Value Date/Time   NA 135 07/01/2019 0500   K 4.0 07/01/2019 0500   CL 105 07/01/2019 0500   CO2 22 07/01/2019 0500   GLUCOSE 101 (H) 07/01/2019 0500   BUN 9 07/01/2019 0500   CREATININE 0.67 07/01/2019 0500   CALCIUM 8.3 (L) 07/01/2019 0500   PROT 6.5 07/01/2019 0500   ALBUMIN 2.4 (L) 07/01/2019 0500   AST 142 (H) 07/01/2019 0500   ALT 52 (H) 07/01/2019 0500   ALKPHOS 188 (H) 07/01/2019 0500   BILITOT 1.6 (H) 07/01/2019 0500   GFRNONAA >60 07/01/2019 0500   GFRAA >60 07/01/2019 0500   Lipase     Component Value Date/Time   LIPASE 43 06/28/2019 2123       Studies/Results: CT CHEST WO CONTRAST  Result Date: 06/30/2019 CLINICAL  DATA:  54 year old female with history of cancer of unknown primary origin. EXAM: CT CHEST WITHOUT CONTRAST TECHNIQUE: Multidetector CT imaging of the chest was performed following the standard protocol without IV contrast. COMPARISON:  No prior chest CT. CT the abdomen and pelvis 06/28/2019. FINDINGS: Cardiovascular: Heart size is normal. There is no significant pericardial fluid, thickening or pericardial calcification. There is aortic atherosclerosis, as well as atherosclerosis of the great vessels of the mediastinum and the coronary arteries, including calcified atherosclerotic plaque in the left main and left anterior descending coronary arteries. Mediastinum/Nodes: Multiple prominent borderline enlarged mediastinal lymph nodes measuring up to 9 mm in short axis, nonspecific. Esophagus is unremarkable in appearance. No axillary lymphadenopathy. Lungs/Pleura: Multiple pulmonary nodules are again noted throughout the lungs bilaterally, concerning for widespread metastatic disease. These nodules generally range from 2-8 mm in size. In addition, at the right lung base in the right lower lobe there is a mass-like area which appears related to direct invasion across the right hemidiaphragm from large liver mass, better demonstrated on prior CT the abdomen and pelvis 06/28/2019 (see that report for full details). Trace volume of right pleural fluid lying dependently. No left pleural effusion.  Upper Abdomen: Large mass in the right lobe of the liver poorly demonstrated on today's noncontrast CT examination. See report from contemporaneously obtained abdominal MRI 06/29/2019 for full description of findings beneath the diaphragm. Musculoskeletal: There are no aggressive appearing lytic or blastic lesions noted in the visualized portions of the skeleton. IMPRESSION: 1. Multiple pulmonary nodules highly concerning for widespread metastatic disease to the lungs. 2. Large right lobe of the liver mass with trans  diaphragmatic invasion into the lower right hemithorax involving portions of the right lower lobe. 3. Aortic atherosclerosis, in addition to left main and left anterior descending coronary artery disease. Please note that although the presence of coronary artery calcium documents the presence of coronary artery disease, the severity of this disease and any potential stenosis cannot be assessed on this non-gated CT examination. Assessment for potential risk factor modification, dietary therapy or pharmacologic therapy may be warranted, if clinically indicated. Aortic Atherosclerosis (ICD10-I70.0). Electronically Signed   By: Vinnie Langton M.D.   On: 06/30/2019 12:48   MR ABDOMEN W WO CONTRAST  Result Date: 06/30/2019 CLINICAL DATA:  54 year old female with liver lesion noted on prior CT examination. Follow-up study. EXAM: MRI ABDOMEN WITHOUT AND WITH CONTRAST TECHNIQUE: Multiplanar multisequence MR imaging of the abdomen was performed both before and after the administration of intravenous contrast. CONTRAST:  5.56mL GADAVIST GADOBUTROL 1 MMOL/ML IV SOLN COMPARISON:  No prior abdominal MRI. CT the abdomen and pelvis 06/28/2019. FINDINGS: Comment: Portions of today's examination are limited by extensive patient respiratory motion. Lower chest: Trace right pleural effusion lying dependently. Numerous small areas of increased signal intensity corresponding to pulmonary nodules noted throughout the visualize lung bases. Hepatobiliary: Liver has a slightly shrunken appearance and nodular contour, indicative of underlying cirrhosis. There is heterogeneous signal intensity throughout the liver, most evident in the right lobe of the liver where there are widespread somewhat ill-defined areas of mild T2 hyperintensity and T1 hypointensity. In the superior aspect of segment 7 of the liver there is a well-defined mass (axial image 9 of series 10 and coronal image 9 of series 4) measuring 4.4 x 4.4 x 4.1 cm, which  demonstrates heterogeneous signal intensity on T1 and T2 weighted images, but generally has a peripheral rim of T2 hypointensity, and demonstrates heterogeneous internal enhancement on post gadolinium images without a specific enhancement pattern. Extending cephalad from this lesion there is additional irregular malignant appearing soft tissue which appears to directly invade the right hemidiaphragm best appreciated on coronal image 10 of series 4 and axial image 6 of series 5 where this is estimated to measure approximately 8.6 x 7.2 x 3.3 cm. This may also directly invade the inferior aspect of the right lower lobe, although this is difficult to say for certain on today's magnetic resonance examination. Notably, the ill-defined areas of T2 hyperintensity throughout the right lobe of the liver correspond to relatively diffuse arterial phase hyperenhancement on post gadolinium images which decreased on subsequent post gadolinium delayed imaging. Markedly heterogeneous perfusion is evident throughout the posterior aspect of the right lobe of the liver, predominantly segments 7 and 6, suggesting widespread infiltrative poorly defined neoplasm in these regions (although some of this could be accounted for by the altered perfusion throughout this portion of the liver related to portal and hepatic venous obstruction). The right branches of the portal vein appear dilated and do not demonstrate normal portal venous enhancement, with abrupt truncation of the right main portal vein best appreciated on axial image 42 of series 19), suggesting  extensive tumor thrombus in the portal venous system of the right lobe of the liver. There is also incomplete opacification of the right hepatic vein (axial image 36 of series 19), suggesting additional tumor thrombus in the right hepatic vein. No intra or extrahepatic biliary ductal dilatation. Gallbladder is moderately distended with diffuse gallbladder wall edema, but no overt  surrounding inflammatory changes. No filling defects in the gallbladder to suggest cholelithiasis. Pancreas: No pancreatic mass. No pancreatic ductal dilatation. No pancreatic or peripancreatic fluid collections or inflammatory changes. Spleen: Spleen appears borderline enlarged measuring 11.3 x 5.7 x 11.2 cm (estimated splenic volume of 361 mL) . Adrenals/Urinary Tract: Bilateral kidneys and adrenal glands are normal in appearance. No hydroureteronephrosis in the visualized portions of the abdomen. Stomach/Bowel: Visualized portions are unremarkable. Vascular/Lymphatic: No aneurysm identified in the visualized abdominal vasculature. Probable tumor thrombus in the right portal and hepatic venous systems, as detailed above. Numerous serpiginous densities adjacent to the proximal stomach and distal esophagus, compatible with varices. No definite lymphadenopathy confidently identified in the abdomen. Other: No significant volume of ascites noted in the visualized portions of the peritoneal cavity. Musculoskeletal: No aggressive appearing osseous lesions are noted in the visualized portions of the skeleton. IMPRESSION: 1. Cirrhosis with probable diffusely infiltrative hepatocellular carcinoma throughout the right lobe of the liver, including a more well encapsulated area in segment 7 near the dome. This appears to extend out of the hepatic capsule invading the right hemidiaphragm which is enlarged, and potentially invading the lower right hemithorax with possible involvement of the inferior aspect of the right lower lobe. Trace right pleural effusion is likely malignant. 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 2. Multiple small pulmonary nodules scattered throughout the visualize lung bases compatible with widespread metastatic disease to the lungs, better demonstrated on chest CT 06/30/2019. 3. Moderate dilatation of the gallbladder with diffuse gallbladder wall edema. However,  there are no gallstones and there is no pericholecystic fluid. This likely reflects gallbladder wall edema from intrinsic hepatic disease, and is not favored to indicate an acute cholecystitis at this time. 4. Borderline splenomegaly. 5. 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 Electronically Signed   By: Vinnie Langton M.D.   On: 06/30/2019 13:12   ECHOCARDIOGRAM COMPLETE  Result Date: 06/30/2019    ECHOCARDIOGRAM REPORT   Patient Name:   Valerie [name] Date of Exam: 06/30/2019 Medical Rec #:  [id number]    Height:       62.0 in Accession #:    [id number]   Weight:       125.6 lb Date of Birth:  1966/04/11    BSA:          1.569 m Patient Age:    42 years     BP:           147/95 mmHg Patient Gender: F            HR:           71 bpm. Exam Location:  Inpatient Procedure: 2D Echo, Cardiac Doppler and Color Doppler Indications:    Pulmonary hypertension 416.8/I27.2  History:        Patient has no prior history of Echocardiogram examinations.                 Risk Factors:Current Smoker. Thrombocytopenia. Liver cirrhosis.                 SIRS. Hepatitis C. Marijuana abuse.  Sonographer:    Arnell Sieving  Danelle Berry (AE) Referring Phys: [id number] CURTIS J WOODS  Sonographer Comments: Patient movement. Echocardiogram ended before protocol complete due to patient stating she could not tolerate laying on back or left side. Nurse notified. IMPRESSIONS  1. Limited images, incomplete study.  2. Left ventricular ejection fraction, by estimation, is 60 to 65%. The left ventricle has normal function. The left ventricle has no regional wall motion abnormalities. Left ventricular diastolic function could not be evaluated.  3. Right ventricular systolic function is normal. The right ventricular size is normal. Tricuspid regurgitation signal is inadequate for assessing PA pressure.  4. The mitral valve is grossly normal. Trivial mitral valve regurgitation.  5. The aortic valve is tricuspid. Aortic valve regurgitation is not  visualized. FINDINGS  Left Ventricle: Left ventricular ejection fraction, by estimation, is 60 to 65%. The left ventricle has normal function. The left ventricle has no regional wall motion abnormalities. The left ventricular internal cavity size was normal in size. There is  borderline left ventricular hypertrophy. Left ventricular diastolic function could not be evaluated. Right Ventricle: The right ventricular size is normal. No increase in right ventricular wall thickness. Right ventricular systolic function is normal. Tricuspid regurgitation signal is inadequate for assessing PA pressure. Left Atrium: Left atrial size was normal in size. Right Atrium: Right atrial size was normal in size. Pericardium: There is no evidence of pericardial effusion. Mitral Valve: The mitral valve is grossly normal. Trivial mitral valve regurgitation. Tricuspid Valve: The tricuspid valve is grossly normal. Tricuspid valve regurgitation is mild. Aortic Valve: The aortic valve is tricuspid. Aortic valve regurgitation is not visualized. Mild aortic valve annular calcification. Pulmonic Valve: The pulmonic valve was grossly normal. Pulmonic valve regurgitation is mild. Aorta: The aortic root is normal in size and structure. Venous: The inferior vena cava was not well visualized. IAS/Shunts: No atrial level shunt detected by color flow Doppler.  LEFT VENTRICLE PLAX 2D LVIDd:         4.90 cm LVIDs:         3.10 cm LV PW:         1.10 cm LV IVS:        0.90 cm LVOT diam:     1.80 cm LVOT Area:     2.54 cm  LEFT ATRIUM         Index LA diam:    3.60 cm 2.29 cm/m   AORTA Ao Root diam: 2.60 cm Ao Asc diam:  3.20 cm TRICUSPID VALVE TR Peak grad:   22.1 mmHg TR Vmax:        235.00 cm/s  SHUNTS Systemic Diam: 1.80 cm Rozann Lesches MD Electronically signed by Rozann Lesches MD Signature Date/Time: 06/30/2019/11:31:13 AM    Final     Anti-infectives: Anti-infectives (From admission, onward)   Start     Dose/Rate Route Frequency Ordered  Stop   06/29/19 1000  piperacillin-tazobactam (ZOSYN) IVPB 3.375 g  Status:  Discontinued     3.375 g 12.5 mL/hr over 240 Minutes Intravenous Every 8 hours 06/29/19 0353 06/30/19 1354   06/29/19 0400  piperacillin-tazobactam (ZOSYN) IVPB 3.375 g     3.375 g 100 mL/hr over 30 Minutes Intravenous  Once 06/29/19 0353 06/29/19 0537   06/28/19 2245  metroNIDAZOLE (FLAGYL) IVPB 500 mg     500 mg 100 mL/hr over 60 Minutes Intravenous  Once 06/28/19 2239 06/29/19 0022   06/28/19 2115  cefTRIAXone (ROCEPHIN) 2 g in sodium chloride 0.9 % 100 mL IVPB     2 g 200  mL/hr over 30 Minutes Intravenous  Once 06/28/19 2112 06/28/19 2141       Assessment/Plan Cirrhosis Hepatitis C  Probable metastatic hepatocellular carcinoma - MRI shows diffusely infiltrative hepatocellular carcinoma throughout the right lobe of the liver extending out of the hepatic capsule invading the right hemidiaphragm - CT chest shows multiple pulmonary nodules suspicious for mets - AFP pending  - Will need bone scan - Oncology on board. Await further recs - Follow up Dr. Barry Dienes  - We will follow peripherally   FEN - HH VTE - Heparin ID - None    LOS: 2 days    Jillyn Ledger , [facility] 07/01/2019, 11:00 AM Please see Amion for pager number during day hours 7:00am-4:30pm

## 2019-07-02 ENCOUNTER — Inpatient Hospital Stay (HOSPITAL_COMMUNITY): Payer: Medicaid Other

## 2019-07-02 DIAGNOSIS — F17210 Nicotine dependence, cigarettes, uncomplicated: Secondary | ICD-10-CM

## 2019-07-02 DIAGNOSIS — B182 Chronic viral hepatitis C: Secondary | ICD-10-CM

## 2019-07-02 DIAGNOSIS — Z885 Allergy status to narcotic agent status: Secondary | ICD-10-CM

## 2019-07-02 DIAGNOSIS — E46 Unspecified protein-calorie malnutrition: Secondary | ICD-10-CM

## 2019-07-02 DIAGNOSIS — K7460 Unspecified cirrhosis of liver: Secondary | ICD-10-CM

## 2019-07-02 DIAGNOSIS — Z888 Allergy status to other drugs, medicaments and biological substances status: Secondary | ICD-10-CM

## 2019-07-02 DIAGNOSIS — R160 Hepatomegaly, not elsewhere classified: Secondary | ICD-10-CM

## 2019-07-02 LAB — CBC WITH DIFFERENTIAL/PLATELET
Abs Immature Granulocytes: 0.01 10*3/uL (ref 0.00–0.07)
Basophils Absolute: 0 10*3/uL (ref 0.0–0.1)
Basophils Relative: 0 %
Eosinophils Absolute: 0.2 10*3/uL (ref 0.0–0.5)
Eosinophils Relative: 4 %
HCT: 31.8 % — ABNORMAL LOW (ref 36.0–46.0)
Hemoglobin: 10.7 g/dL — ABNORMAL LOW (ref 12.0–15.0)
Immature Granulocytes: 0 %
Lymphocytes Relative: 35 %
Lymphs Abs: 1.7 10*3/uL (ref 0.7–4.0)
MCH: 31.4 pg (ref 26.0–34.0)
MCHC: 33.6 g/dL (ref 30.0–36.0)
MCV: 93.3 fL (ref 80.0–100.0)
Monocytes Absolute: 0.6 10*3/uL (ref 0.1–1.0)
Monocytes Relative: 12 %
Neutro Abs: 2.3 10*3/uL (ref 1.7–7.7)
Neutrophils Relative %: 49 %
Platelets: 107 10*3/uL — ABNORMAL LOW (ref 150–400)
RBC: 3.41 MIL/uL — ABNORMAL LOW (ref 3.87–5.11)
RDW: 15.1 % (ref 11.5–15.5)
WBC: 4.8 10*3/uL (ref 4.0–10.5)
nRBC: 0 % (ref 0.0–0.2)

## 2019-07-02 LAB — COMPREHENSIVE METABOLIC PANEL
ALT: 50 U/L — ABNORMAL HIGH (ref 0–44)
AST: 148 U/L — ABNORMAL HIGH (ref 15–41)
Albumin: 2.4 g/dL — ABNORMAL LOW (ref 3.5–5.0)
Alkaline Phosphatase: 198 U/L — ABNORMAL HIGH (ref 38–126)
Anion gap: 5 (ref 5–15)
BUN: 7 mg/dL (ref 6–20)
CO2: 24 mmol/L (ref 22–32)
Calcium: 8.1 mg/dL — ABNORMAL LOW (ref 8.9–10.3)
Chloride: 105 mmol/L (ref 98–111)
Creatinine, Ser: 0.59 mg/dL (ref 0.44–1.00)
GFR calc Af Amer: >60 mL/min (ref >60)
GFR calc non Af Amer: >60 mL/min (ref >60)
Glucose, Bld: 105 mg/dL — ABNORMAL HIGH (ref 70–99)
Potassium: 3.9 mmol/L (ref 3.5–5.1)
Sodium: 134 mmol/L — ABNORMAL LOW (ref 135–145)
Total Bilirubin: 1.6 mg/dL — ABNORMAL HIGH (ref 0.3–1.2)
Total Protein: 6.6 g/dL (ref 6.5–8.1)

## 2019-07-02 LAB — PHOSPHORUS: Phosphorus: 2.3 mg/dL — ABNORMAL LOW (ref 2.5–4.6)

## 2019-07-02 LAB — MAGNESIUM: Magnesium: 2 mg/dL (ref 1.7–2.4)

## 2019-07-02 MED ORDER — FENTANYL 25 MCG/HR TD PT72
1.0000 | MEDICATED_PATCH | TRANSDERMAL | Status: DC
  Administered 2019-07-02: 1 via TRANSDERMAL
  Filled 2019-07-02: qty 1

## 2019-07-02 MED ORDER — LORAZEPAM 0.5 MG PO TABS: 0.5000 mg | ORAL_TABLET | Freq: Two times a day (BID) | ORAL | Status: DC | PRN

## 2019-07-02 MED ORDER — METHOCARBAMOL 500 MG PO TABS: 500.0000 mg | ORAL_TABLET | Freq: Three times a day (TID) | ORAL | Status: DC | PRN

## 2019-07-02 MED ORDER — OXYCODONE HCL 5 MG PO TABS
5.0000 mg | ORAL_TABLET | ORAL | Status: DC | PRN
  Administered 2019-07-02: 5 mg via ORAL
  Filled 2019-07-02: qty 1

## 2019-07-02 MED ORDER — TECHNETIUM TC 99M MEDRONATE IV KIT
20.0000 | PACK | Freq: Once | INTRAVENOUS | Status: AC | PRN
  Administered 2019-07-02: 20 via INTRAVENOUS

## 2019-07-02 NOTE — Progress Notes (Signed)
PALLIATIVE NOTE:  Referral received for goals of care. Chart reviewed and updates received. Discussed plan of care with Valerie Downer, NP (Oncology). Patient currently off the department in Nuclear Med.   Will re-attempt to have [place] discussion with patient and hopefully daughter at later date/time.   Detailed notes to follow once discussion has been completed.   Thank you for your referral and allowing Palliative to assist in Ms. Spurr's care.   Alda Lea, AGPCNP-BC Palliative Medicine Team  Phone: [phone] Pager: [phone] Amion: N. [place]   NO CHARGE

## 2019-07-02 NOTE — Consult Note (Signed)
Chief Complaint: Patient was seen in consultation today for liver mass/biopsy.  Referring Physician(s): Maryanna Shape  Supervising Physician: Arne Cleveland  Patient Status: [facility] - In-pt  History of Present Illness: Valerie [name] is a 54 y.o. female with a past medical history of pseudoseizures, cirrhosis, hepatitis C, and bipolar disorder. She presented to [facility] ED 06/28/2019 with complaints of abdominal pain and N/V. She was admitted for SIRS work-up. CT abdomen/pelvis revealed numerous pulmonary nodules and hepatic mass, suspicious of metastatic disease.  CT abdomen/pelvis 06/28/2019: 1. Probable liver cirrhosis. Large heterogeneous suspicion mass measuring at least 8.8 cm, probably arising from the right hepatic lobe of the liver but extending cephalad toward the right lung base with possible violation of the capsule and invasion/involvement of the right diaphragm. At least 1 additional smaller solid mass in the inferior right hepatic lobe either representing metastatic disease or multifocal disease. Correlation with MRI should be considered. 2. Numerous pulmonary nodules consistent with metastatic disease 3. Possible gallbladder wall thickening or small amount of pericholecystic fluid as seen on recent ultrasound 4. Diffuse diverticular disease of the colon with possible wall thickening/mild acute inflammation at the cecum and ascending colon. 5. Small amount of free fluid in the pelvis.  MR abdomen 06/29/2019: 1. Cirrhosis with probable diffusely infiltrative hepatocellular carcinoma throughout the right lobe of the liver, including a more well encapsulated area in segment 7 near the dome. This appears to extend out of the hepatic capsule invading the right hemidiaphragm which is enlarged, and potentially invading the lower right hemithorax with possible involvement of the inferior aspect of the right lower lobe. Trace right pleural effusion is likely malignant. Tumor thrombus  involving the right portal and hepatic venous systems, as above. 2. Multiple small pulmonary nodules scattered throughout the visualize lung bases compatible with widespread metastatic disease to the lungs, better demonstrated on chest CT 06/30/2019. 3. Moderate dilatation of the gallbladder with diffuse gallbladder wall edema. However, there are no gallstones and there is no pericholecystic fluid. This likely reflects gallbladder wall edema from intrinsic hepatic disease, and is not favored to indicate an acute cholecystitis at this time. 4. Borderline splenomegaly. 5. 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  IR requested by Mikey Bussing, NP for possible image-guided liver mass biopsy. Patient laying in bed resting comfortably. She is lethargic but easily arousable. A&Ox4. Accompanied by daughter at bedside. Complains of right scapular pain, stable since admission. Denies fever, chills, chest pain, dyspnea, abdominal pain, or headache.   Past Medical History:  Diagnosis Date  . Bipolar disorder ([place])   . Hepatitis C   . Liver cirrhosis ([place])   . Pseudoseizures     Past Surgical History:  Procedure Laterality Date  . CESAREAN SECTION      Allergies: Norco [hydrocodone-acetaminophen] and Tylenol [acetaminophen]  Medications: Prior to Admission medications   Medication Sig Start Date End Date Taking? Authorizing Provider  diphenoxylate-atropine (LOMOTIL) 2.5-0.025 MG tablet Take 2 tablets by mouth 4 (four) times daily as needed for diarrhea or loose stools. 06/22/19  Yes Upstill, Nehemiah Settle, PA-C  ibuprofen (ADVIL) 600 MG tablet Take 1 tablet (600 mg total) by mouth every 6 (six) hours as needed. 06/22/19  Yes Upstill, Nehemiah Settle, PA-C  methocarbamol (ROBAXIN) 500 MG tablet Take 1 tablet (500 mg total) by mouth 2 (two) times daily. 06/22/19  Yes Upstill, Shari, PA-C  ondansetron (ZOFRAN ODT) 4 MG disintegrating tablet Take 1 tablet (4 mg total) by mouth every 8 (eight) hours as  needed for nausea  or vomiting. 06/22/19  Yes Charlann Lange, PA-C     Family History  Problem Relation Age of Onset  . Heart disease Neg Hx     Social History   Socioeconomic History  . Marital status: Unknown    Spouse name: Not on file  . Number of children: 6  . Years of education: Not on file  . Highest education level: Not on file  Occupational History  . Not on file  Tobacco Use  . Smoking status: Current Every Day Smoker    Packs/day: 0.50    Types: Cigarettes  . Smokeless tobacco: Never Used  Substance and Sexual Activity  . Alcohol use: No  . Drug use: No  . Sexual activity: Not on file  Other Topics Concern  . Not on file  Social History Narrative  . Not on file   Social Determinants of Health   Financial Resource Strain:   . Difficulty of Paying Living Expenses:   Food Insecurity:   . Worried About Charity fundraiser in the Last Year:   . Arboriculturist in the Last Year:   Transportation Needs:   . Film/video editor (Medical):   Marland Kitchen Lack of Transportation (Non-Medical):   Physical Activity:   . Days of Exercise per Week:   . Minutes of Exercise per Session:   Stress:   . Feeling of Stress :   Social Connections:   . Frequency of Communication with Friends and Family:   . Frequency of Social Gatherings with Friends and Family:   . Attends Religious Services:   . Active Member of Clubs or Organizations:   . Attends Archivist Meetings:   Marland Kitchen Marital Status:      Review of Systems: A 12 point ROS discussed and pertinent positives are indicated in the HPI above.  All other systems are negative.  Review of Systems  Constitutional: Negative for chills and fever.  Respiratory: Negative for shortness of breath and wheezing.   Cardiovascular: Negative for chest pain and palpitations.  Gastrointestinal: Negative for abdominal pain.  Neurological: Negative for headaches.  Psychiatric/Behavioral: Negative for behavioral problems and  confusion.    Vital Signs: BP (!) 168/97 (BP Location: Left Arm)   Pulse 76   Temp 98.2 F (36.8 C) (Oral)   Resp 17   Ht 5\' 2"  (1.575 m)   Wt 127 lb 3.2 oz (57.7 kg)   SpO2 97%   BMI 23.27 kg/m   Physical Exam Vitals and nursing note reviewed.  Constitutional:      General: She is not in acute distress. Cardiovascular:     Rate and Rhythm: Normal rate and regular rhythm.     Heart sounds: Normal heart sounds. No murmur.  Pulmonary:     Effort: Pulmonary effort is normal. No respiratory distress.     Breath sounds: Normal breath sounds. No wheezing.  Skin:    General: Skin is warm and dry.  Neurological:     Mental Status: She is alert and oriented to person, place, and time.      MD Evaluation Airway: WNL Heart: WNL Abdomen: WNL Chest/ Lungs: WNL ASA  Classification: 3 Mallampati/Airway Score: Two   Imaging: DG Chest 2 View  Result Date: 06/21/2019 CLINICAL DATA:  Chest pain EXAM: CHEST - 2 VIEW COMPARISON:  None. FINDINGS: Cardiac shadow is within normal limits. Mild right basilar atelectasis is noted. No effusion is noted. No bony abnormality is seen. Aortic calcifications are noted. IMPRESSION: Mild  right basilar atelectasis. Electronically Signed   By: Inez Catalina M.D.   On: 06/21/2019 22:05   CT HEAD WO CONTRAST  Result Date: 06/29/2019 CLINICAL DATA:  Headache, intracranial hemorrhage suspected. Additional history provided: Diffuse headache with pain radiating into neck ongoing for days. EXAM: CT HEAD WITHOUT CONTRAST TECHNIQUE: Contiguous axial images were obtained from the base of the skull through the vertex without intravenous contrast. COMPARISON:  Head CT 07/03/2017, brain MRI 11/27/2016 FINDINGS: Brain: There is no evidence of acute intracranial hemorrhage, intracranial mass, midline shift or extra-axial fluid collection.No demarcated cortical infarction. Cerebral volume is normal for age. Vascular: No hyperdense vessel.  Atherosclerotic calcifications  Skull: Normal. Negative for fracture or focal lesion. Sinuses/Orbits: Visualized orbits demonstrate no acute abnormality. No significant paranasal sinus disease or mastoid effusion at the imaged levels. IMPRESSION: No evidence of acute intracranial abnormality. Specifically, no evidence of acute intracranial hemorrhage. Electronically Signed   By: Kellie Simmering DO   On: 06/29/2019 08:25   CT CHEST WO CONTRAST  Result Date: 06/30/2019 CLINICAL DATA:  54 year old female with history of cancer of unknown primary origin. EXAM: CT CHEST WITHOUT CONTRAST TECHNIQUE: Multidetector CT imaging of the chest was performed following the standard protocol without IV contrast. COMPARISON:  No prior chest CT. CT the abdomen and pelvis 06/28/2019. FINDINGS: Cardiovascular: Heart size is normal. There is no significant pericardial fluid, thickening or pericardial calcification. There is aortic atherosclerosis, as well as atherosclerosis of the great vessels of the mediastinum and the coronary arteries, including calcified atherosclerotic plaque in the left main and left anterior descending coronary arteries. Mediastinum/Nodes: Multiple prominent borderline enlarged mediastinal lymph nodes measuring up to 9 mm in short axis, nonspecific. Esophagus is unremarkable in appearance. No axillary lymphadenopathy. Lungs/Pleura: Multiple pulmonary nodules are again noted throughout the lungs bilaterally, concerning for widespread metastatic disease. These nodules generally range from 2-8 mm in size. In addition, at the right lung base in the right lower lobe there is a mass-like area which appears related to direct invasion across the right hemidiaphragm from large liver mass, better demonstrated on prior CT the abdomen and pelvis 06/28/2019 (see that report for full details). Trace volume of right pleural fluid lying dependently. No left pleural effusion. Upper Abdomen: Large mass in the right lobe of the liver poorly demonstrated on  today's noncontrast CT examination. See report from contemporaneously obtained abdominal MRI 06/29/2019 for full description of findings beneath the diaphragm. Musculoskeletal: There are no aggressive appearing lytic or blastic lesions noted in the visualized portions of the skeleton. IMPRESSION: 1. Multiple pulmonary nodules highly concerning for widespread metastatic disease to the lungs. 2. Large right lobe of the liver mass with trans diaphragmatic invasion into the lower right hemithorax involving portions of the right lower lobe. 3. Aortic atherosclerosis, in addition to left main and left anterior descending coronary artery disease. Please note that although the presence of coronary artery calcium documents the presence of coronary artery disease, the severity of this disease and any potential stenosis cannot be assessed on this non-gated CT examination. Assessment for potential risk factor modification, dietary therapy or pharmacologic therapy may be warranted, if clinically indicated. Aortic Atherosclerosis (ICD10-I70.0). Electronically Signed   By: Vinnie Langton M.D.   On: 06/30/2019 12:48   MR ABDOMEN W WO CONTRAST  Result Date: 06/30/2019 CLINICAL DATA:  54 year old female with liver lesion noted on prior CT examination. Follow-up study. EXAM: MRI ABDOMEN WITHOUT AND WITH CONTRAST TECHNIQUE: Multiplanar multisequence MR imaging of the abdomen was performed both  before and after the administration of intravenous contrast. CONTRAST:  5.5mL GADAVIST GADOBUTROL 1 MMOL/ML IV SOLN COMPARISON:  No prior abdominal MRI. CT the abdomen and pelvis 06/28/2019. FINDINGS: Comment: Portions of today's examination are limited by extensive patient respiratory motion. Lower chest: Trace right pleural effusion lying dependently. Numerous small areas of increased signal intensity corresponding to pulmonary nodules noted throughout the visualize lung bases. Hepatobiliary: Liver has a slightly shrunken appearance and  nodular contour, indicative of underlying cirrhosis. There is heterogeneous signal intensity throughout the liver, most evident in the right lobe of the liver where there are widespread somewhat ill-defined areas of mild T2 hyperintensity and T1 hypointensity. In the superior aspect of segment 7 of the liver there is a well-defined mass (axial image 9 of series 10 and coronal image 9 of series 4) measuring 4.4 x 4.4 x 4.1 cm, which demonstrates heterogeneous signal intensity on T1 and T2 weighted images, but generally has a peripheral rim of T2 hypointensity, and demonstrates heterogeneous internal enhancement on post gadolinium images without a specific enhancement pattern. Extending cephalad from this lesion there is additional irregular malignant appearing soft tissue which appears to directly invade the right hemidiaphragm best appreciated on coronal image 10 of series 4 and axial image 6 of series 5 where this is estimated to measure approximately 8.6 x 7.2 x 3.3 cm. This may also directly invade the inferior aspect of the right lower lobe, although this is difficult to say for certain on today's magnetic resonance examination. Notably, the ill-defined areas of T2 hyperintensity throughout the right lobe of the liver correspond to relatively diffuse arterial phase hyperenhancement on post gadolinium images which decreased on subsequent post gadolinium delayed imaging. Markedly heterogeneous perfusion is evident throughout the posterior aspect of the right lobe of the liver, predominantly segments 7 and 6, suggesting widespread infiltrative poorly defined neoplasm in these regions (although some of this could be accounted for by the altered perfusion throughout this portion of the liver related to portal and hepatic venous obstruction). The right branches of the portal vein appear dilated and do not demonstrate normal portal venous enhancement, with abrupt truncation of the right main portal vein best  appreciated on axial image 42 of series 19), suggesting extensive tumor thrombus in the portal venous system of the right lobe of the liver. There is also incomplete opacification of the right hepatic vein (axial image 36 of series 19), suggesting additional tumor thrombus in the right hepatic vein. No intra or extrahepatic biliary ductal dilatation. Gallbladder is moderately distended with diffuse gallbladder wall edema, but no overt surrounding inflammatory changes. No filling defects in the gallbladder to suggest cholelithiasis. Pancreas: No pancreatic mass. No pancreatic ductal dilatation. No pancreatic or peripancreatic fluid collections or inflammatory changes. Spleen: Spleen appears borderline enlarged measuring 11.3 x 5.7 x 11.2 cm (estimated splenic volume of 361 mL) . Adrenals/Urinary Tract: Bilateral kidneys and adrenal glands are normal in appearance. No hydroureteronephrosis in the visualized portions of the abdomen. Stomach/Bowel: Visualized portions are unremarkable. Vascular/Lymphatic: No aneurysm identified in the visualized abdominal vasculature. Probable tumor thrombus in the right portal and hepatic venous systems, as detailed above. Numerous serpiginous densities adjacent to the proximal stomach and distal esophagus, compatible with varices. No definite lymphadenopathy confidently identified in the abdomen. Other: No significant volume of ascites noted in the visualized portions of the peritoneal cavity. Musculoskeletal: No aggressive appearing osseous lesions are noted in the visualized portions of the skeleton. IMPRESSION: 1. Cirrhosis with probable diffusely infiltrative hepatocellular carcinoma throughout  the right lobe of the liver, including a more well encapsulated area in segment 7 near the dome. This appears to extend out of the hepatic capsule invading the right hemidiaphragm which is enlarged, and potentially invading the lower right hemithorax with possible involvement of the  inferior aspect of the right lower lobe. Trace right pleural effusion is likely malignant. 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 2. Multiple small pulmonary nodules scattered throughout the visualize lung bases compatible with widespread metastatic disease to the lungs, better demonstrated on chest CT 06/30/2019. 3. Moderate dilatation of the gallbladder with diffuse gallbladder wall edema. However, there are no gallstones and there is no pericholecystic fluid. This likely reflects gallbladder wall edema from intrinsic hepatic disease, and is not favored to indicate an acute cholecystitis at this time. 4. Borderline splenomegaly. 5. 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 Electronically Signed   By: Vinnie Langton M.D.   On: 06/30/2019 13:12   NM Bone Scan Whole Body  Result Date: 07/02/2019 CLINICAL DATA:  Unknown primary malignancy. EXAM: NUCLEAR MEDICINE WHOLE BODY BONE SCAN TECHNIQUE: Whole body anterior and posterior images were obtained approximately 3 hours after intravenous injection of radiopharmaceutical. RADIOPHARMACEUTICALS:  21.9 mCi Technetium-69m MDP IV COMPARISON:  None. FINDINGS: Degenerative changes are seen involving both shoulders. No other areas of abnormal uptake are noted. IMPRESSION: No definite scintigraphic evidence of osseous metastases. Electronically Signed   By: Marijo Conception M.D.   On: 07/02/2019 14:56   CT ABDOMEN PELVIS W CONTRAST  Result Date: 06/28/2019 CLINICAL DATA:  Right lower quadrant pain EXAM: CT ABDOMEN AND PELVIS WITH CONTRAST TECHNIQUE: Multidetector CT imaging of the abdomen and pelvis was performed using the standard protocol following bolus administration of intravenous contrast. CONTRAST:  163mL OMNIPAQUE IOHEXOL 300 MG/ML  SOLN COMPARISON:  Ultrasound 06/28/2019 FINDINGS: Lower chest: Lung bases demonstrate numerous pulmonary nodules measuring up to 8 mm in size concerning for metastatic disease.  Heart size within normal limits. Hepatobiliary: Heterogenous liver. Subtle contour nodularity suggesting cirrhosis. No calcified gallstone. Small amount of pericholecystic fluid or wall thickening though normal appearance of the gallbladder by sonography. Large heterogenous mass that appears to be within the posterior right hepatic lobe. This measures approximately 8.2 x 6.9 by 8.8 cm and contains calcifications. The mass may invade the right diaphragm given its appearance on coronal views. Additional smaller mass measuring 2.9 cm, series 3, image number 28 more inferiorly within the right hepatic lobe. Suspect that there may be additional vague hypodense masses in the right lobe. Pancreas: Unremarkable. No pancreatic ductal dilatation or surrounding inflammatory changes. Spleen: Borderline enlarged. Adrenals/Urinary Tract: Left adrenal gland is normal. Questionable involvement of right adrenal gland by the hepatic mass lesion, series 3, image number 21. Kidneys show no hydronephrosis. The bladder is normal Stomach/Bowel: Stomach nonenlarged. No dilated small bowel. Diverticular disease of the colon. Possible mild wall thickening at the cecum and ascending colon. The appendix is negative. Vascular/Lymphatic: Moderate aortic atherosclerosis. No aneurysm. Small porta hepatis and gastrohepatic nodes measuring up to 16 mm in size, series 3, image number 30. Reproductive: Uterus and bilateral adnexa are unremarkable. Other: No free air. Small amount of ascites within the abdomen and pelvis. Diffuse hazy appearance of the mesentery. Musculoskeletal: Bones appear slightly dense but are without focal lytic or focal sclerotic process. IMPRESSION: 1. Probable liver cirrhosis. Large heterogeneous suspicion mass measuring at least 8.8 cm, probably arising from the right hepatic lobe of the liver but extending cephalad toward  the right lung base with possible violation of the capsule and invasion/involvement of the right  diaphragm. At least 1 additional smaller solid mass in the inferior right hepatic lobe either representing metastatic disease or multifocal disease. Correlation with MRI should be considered. 2. Numerous pulmonary nodules consistent with metastatic disease 3. Possible gallbladder wall thickening or small amount of pericholecystic fluid as seen on recent ultrasound 4. Diffuse diverticular disease of the colon with possible wall thickening/mild acute inflammation at the cecum and ascending colon. 5. Small amount of free fluid in the pelvis. Electronically Signed   By: Donavan Foil M.D.   On: 06/28/2019 23:41   DG Chest Port 1 View  Result Date: 06/28/2019 CLINICAL DATA:  Cough and shortness of breath. EXAM: PORTABLE CHEST 1 VIEW COMPARISON:  Aug 21, 2019 FINDINGS: The cardiac silhouette is enlarged. There is vascular congestion with developing pulmonary edema. There are small bilateral pleural effusions. Aortic calcifications are noted. Bibasilar atelectasis is noted. There is no acute osseous abnormality. IMPRESSION: Findings suggestive of congestive heart failure. Electronically Signed   By: Constance Holster M.D.   On: 06/28/2019 21:43   ECHOCARDIOGRAM COMPLETE  Result Date: 06/30/2019    ECHOCARDIOGRAM REPORT   Patient Name:   Valerie [name] Date of Exam: 06/30/2019 Medical Rec #:  [id number]    Height:       62.0 in Accession #:    [id number]   Weight:       125.6 lb Date of Birth:  Nov 26, 1965    BSA:          1.569 m Patient Age:    86 years     BP:           147/95 mmHg Patient Gender: F            HR:           71 bpm. Exam Location:  Inpatient Procedure: 2D Echo, Cardiac Doppler and Color Doppler Indications:    Pulmonary hypertension 416.8/I27.2  History:        Patient has no prior history of Echocardiogram examinations.                 Risk Factors:Current Smoker. Thrombocytopenia. Liver cirrhosis.                 SIRS. Hepatitis C. Marijuana abuse.  Sonographer:    Clayton Lefort RDCS (AE) Referring  Phys: [id number] [place]  Sonographer Comments: Patient movement. Echocardiogram ended before protocol complete due to patient stating she could not tolerate laying on back or left side. Nurse notified. IMPRESSIONS  1. Limited images, incomplete study.  2. Left ventricular ejection fraction, by estimation, is 60 to 65%. The left ventricle has normal function. The left ventricle has no regional wall motion abnormalities. Left ventricular diastolic function could not be evaluated.  3. Right ventricular systolic function is normal. The right ventricular size is normal. Tricuspid regurgitation signal is inadequate for assessing PA pressure.  4. The mitral valve is grossly normal. Trivial mitral valve regurgitation.  5. The aortic valve is tricuspid. Aortic valve regurgitation is not visualized. FINDINGS  Left Ventricle: Left ventricular ejection fraction, by estimation, is 60 to 65%. The left ventricle has normal function. The left ventricle has no regional wall motion abnormalities. The left ventricular internal cavity size was normal in size. There is  borderline left ventricular hypertrophy. Left ventricular diastolic function could not be evaluated. Right Ventricle: The right ventricular size is normal. No increase in right  ventricular wall thickness. Right ventricular systolic function is normal. Tricuspid regurgitation signal is inadequate for assessing PA pressure. Left Atrium: Left atrial size was normal in size. Right Atrium: Right atrial size was normal in size. Pericardium: There is no evidence of pericardial effusion. Mitral Valve: The mitral valve is grossly normal. Trivial mitral valve regurgitation. Tricuspid Valve: The tricuspid valve is grossly normal. Tricuspid valve regurgitation is mild. Aortic Valve: The aortic valve is tricuspid. Aortic valve regurgitation is not visualized. Mild aortic valve annular calcification. Pulmonic Valve: The pulmonic valve was grossly normal. Pulmonic valve  regurgitation is mild. Aorta: The aortic root is normal in size and structure. Venous: The inferior vena cava was not well visualized. IAS/Shunts: No atrial level shunt detected by color flow Doppler.  LEFT VENTRICLE PLAX 2D LVIDd:         4.90 cm LVIDs:         3.10 cm LV PW:         1.10 cm LV IVS:        0.90 cm LVOT diam:     1.80 cm LVOT Area:     2.54 cm  LEFT ATRIUM         Index LA diam:    3.60 cm 2.29 cm/m   AORTA Ao Root diam: 2.60 cm Ao Asc diam:  3.20 cm TRICUSPID VALVE TR Peak grad:   22.1 mmHg TR Vmax:        235.00 cm/s  SHUNTS Systemic Diam: 1.80 cm Rozann Lesches MD Electronically signed by Rozann Lesches MD Signature Date/Time: 06/30/2019/11:31:13 AM    Final    US Abdomen Limited RUQ  Result Date: 06/28/2019 CLINICAL DATA:  Chest, right upper quadrant pain for 3 weeks, nausea, vomiting and fevers, history of cirrhosis EXAM: ULTRASOUND ABDOMEN LIMITED RIGHT UPPER QUADRANT COMPARISON:  None. FINDINGS: Gallbladder: Mildly contracted appearance of the gallbladder. No visible calcified gallstones, significant gallbladder wall thickening. Trace pericholecystic fluid. Sonographic Percell Miller sign is reportedly negative. Common bile duct: Diameter: 1.3 mm proximally, nondilated. Distal portion not visualized. Liver: There is a heterogeneous, centrally hyperechoic, peripherally hypoechoic lesion with a thin capsule in the right lobe liver measuring 3.9 x 4.0 x 4.6 cm. Coarsened heterogeneous background parenchymal echogenicity with a nodular hepatic surface contour compatible with history of cirrhosis. Portal vein is patent on color Doppler imaging with normal direction of blood flow towards the liver. Other: None. IMPRESSION: Under distension of the gallbladder. Trace pericholecystic fluid is nonspecific with absence of other features of acute cholecystitis. Should be considered within the appropriate clinical setting. Heterogeneous 4.6 cm mass in the posterior right lobe liver, highly concerning for  hepatocellular carcinoma given background of cirrhosis. Consider further characterization with multiphase MRI or CT, preferably on an outpatient basis when patient is better able to tolerate scanning procedure. Electronically Signed   By: Lovena Le M.D.   On: 06/28/2019 22:16    Labs:  CBC: Recent Labs    06/29/19 0418 06/30/19 0412 07/01/19 0500 07/02/19 0420  WBC 6.0 7.6 6.1 4.8  HGB 10.6* 11.4* 10.9* 10.7*  HCT 31.2* 33.9* 32.5* 31.8*  PLT 97* 114* 127* 107*    COAGS: Recent Labs    06/21/19 2143 06/28/19 2123 06/29/19 1343  INR 1.0 1.1 1.2  APTT  --  32 32    BMP: Recent Labs    06/29/19 0418 06/30/19 0412 07/01/19 0500 07/02/19 0420  NA 137 135 135 134*  K 3.2* 4.0 4.0 3.9  CL 106 103 105 105  CO2 21* 22 22 24   GLUCOSE 128* 96 101* 105*  BUN 8 6 9 7   CALCIUM 8.2* 8.6* 8.3* 8.1*  CREATININE 0.65 0.65 0.67 0.59  GFRNONAA >60 >60 >60 >60  GFRAA >60 >60 >60 >60    LIVER FUNCTION TESTS: Recent Labs    06/29/19 0418 06/30/19 0412 07/01/19 0500 07/02/19 0420  BILITOT 1.1 1.9* 1.6* 1.6*  AST 151* 149* 142* 148*  ALT 53* 56* 52* 50*  ALKPHOS 207* 193* 188* 198*  PROT 6.1* 6.7 6.5 6.6  ALBUMIN 2.5* 2.6* 2.4* 2.4*     Assessment and Plan:  Liver mass. Plan for image-guided liver mass biopsy tentatively for tomorrow 07/03/2019 in IR. Patient will be NPO at midnight. Afebrile and WBCs WNL. She does not take blood thinners. INR 1.2 06/29/2019.  Risks and benefits discussed with the patient including, but not limited to bleeding, infection, damage to adjacent structures or low yield requiring additional tests. All of the patient's questions were answered, patient is agreeable to proceed. Consent signed and in IR binder.   Thank you for this interesting consult.  I greatly enjoyed meeting Valerie [name] and look forward to participating in their care.  A copy of this report was sent to the requesting provider on this date.  Electronically  Signed: Earley Abide, PA-C 07/02/2019, 4:18 PM   I spent a total of 40 Minutes in face to face in clinical consultation, greater than 50% of which was counseling/coordinating care for liver mass/biopsy.

## 2019-07-02 NOTE — Consult Note (Signed)
[facility]    Date of Admission:  06/28/2019     Total days of antibiotics 3 days (stopped)           Reason for Consult: Hepatitis C     Referring Provider: Sherral Hammers  Primary Care Provider: Patient, No Pcp Per     Assessment: Valerie [name] is a 54 y.o. female here with long-standing abdominal pain, diarrhea and nausea found to have advanced and likely metastatic hepatocellular carcinoma with a decline since 2018 and more rapidly with significant weightloss since Christmas 2020. She has been seen by Oncology and further work up underway to stage.   Hepatitis C viral load recently > 600,000 copies. Will send Gentoype to see if we can used prolonged Harvoni for treatment if it is indicated and she agrees. She has had no previous treatment attempts with direct acting antivirals in the past. HIV negative, hepatitis B surface antigen negative with indeterminate hep b core IgM (likely false positive) - will check Hep b surface Antibody and total core antibody.    Will follow along with oncology to see what overall prognosis looks like. She stated she would be willing to accept therapy as long as it is "not those shots" and that it may offer her benefit.      Plan: 1. Hep C genotype ordered  2. Hep B sAb / total cAb ordered  3. Follow up oncology recommendations.     Principal Problem:   SIRS (systemic inflammatory response syndrome) (HCC) Active Problems:   Thrombocytopenia (HCC)   Liver cirrhosis (HCC)   Elevated liver enzymes   Fever   Abdominal pain   . feeding supplement (ENSURE ENLIVE)  237 mL Oral BID BM  . fentaNYL  1 patch Transdermal Q72H  . FLUoxetine  20 mg Oral Daily  . metoprolol tartrate  5 mg Intravenous Q6H    HPI: Valerie [name] is a 54 y.o. female admitted from home with nausea, abdominal pain and diarrhea.   She has a history of liver cirrhosis with hepatitis C and reported a history of treatment for this with interferon  injections + ribivarin in 2004 or so. She only tolerated about 16 weeks of this regimen before she stopped due to intolerable side effects. She never followed up afterward regarding hepatitis.   In discussion with her and her daughter, who is at the bedside with her today, she has been ill for a long time. While Ms. Valerie [name] cannot say with certainty how long she has been ill her daughter feels that she has declined since 2018 notably with weight loss, stomach pains, activity intolerance and inability to work. She states she has lost about 40 lbs over time. She has very itchy skin with occasional rashes, on present on her right pointer finger today that is scaly. Aside from this her primary concern today is pain involving the right anterior chest with radiation to neck. She also reports anorexia and a metallic taste to her foods. She does smoke 1/2 ppd of cigarettes; does not use alcohol.   In the ER she was found to be febrile to 101.5 F, tachycardic and anemic with Hgb 11.7, thrombocytopenia 108K, AST of 187 ALT 62 and normal lactic acid. CT abdomen/pelvis shows concerning features for [facility] in the right hepatic lobe with concern for metastatic lesions in the lung.  Possible cholecystitis - was started on IV antibiotics initially but these have been stopped.  [facility] has seen  her and recommended that the biliary changes are most likely d/t liver mass(es) and recommended further staging with scans and then follow up with Dr. Barry Dienes outpatient.   With concern for metastatic [facility] Dr. Benay Spice has seen her - MRI Abdomen revealed cirrhosis and probably diffusely infiltrative hepatocellular carcinoma throughout the right lobe of the liver extending out the hepatic capsule and invading the right hemidiaphragm and potentilaly to the lower right hemithorax.   Hep C RNA 634,000  --> seems that she did 16 weeks in the past of Ribavirin and interferon.  Hep B sAg (-) Hep B c IgM (equivocal)   Review of Systems: Review  of Systems  Constitutional: Positive for malaise/fatigue and weight loss. Negative for chills and fever.  HENT: Negative for sore throat.   Eyes: Negative for blurred vision.  Respiratory: Negative for cough, sputum production and shortness of breath.   Cardiovascular: Positive for chest pain, orthopnea and leg swelling (at times).  Gastrointestinal: Positive for abdominal pain, diarrhea (constant and chronic) and nausea (anorexia).  Genitourinary: Negative for dysuria, flank pain and hematuria.  Musculoskeletal: Positive for neck pain. Negative for back pain and myalgias.  Skin: Positive for itching and rash.  Neurological: Negative for weakness and headaches.    Past Medical History:  Diagnosis Date  . Bipolar disorder ([place])   . Hepatitis C   . Liver cirrhosis ([place])   . Pseudoseizures     Social History   Tobacco Use  . Smoking status: Current Every Day Smoker    Packs/day: 0.50    Types: Cigarettes  . Smokeless tobacco: Never Used  Substance Use Topics  . Alcohol use: No  . Drug use: No    Family History  Problem Relation Age of Onset  . Heart disease Neg Hx    Allergies  Allergen Reactions  . Norco [Hydrocodone-Acetaminophen] Anaphylaxis and Shortness Of Breath  . Tylenol [Acetaminophen] Other (See Comments)    Cannot take due to liver    OBJECTIVE: Blood pressure (!) 171/97, pulse 79, temperature 98.6 F (37 C), resp. rate 19, height 5\' 2"  (1.575 m), weight 57.7 kg, SpO2 99 %.  Physical Exam Constitutional:      Appearance: She is ill-appearing.     Comments: Thin, malnourished appearing but well developed.   HENT:     Mouth/Throat:     Mouth: Mucous membranes are moist.     Pharynx: Oropharynx is clear.  Eyes:     General: No scleral icterus. Neck:     Vascular: JVD present.  Musculoskeletal:        General: No swelling or tenderness.     Cervical back: Full passive range of motion without pain.  Lymphadenopathy:     Cervical: No cervical  adenopathy.  Skin:    General: Skin is warm and dry.     Capillary Refill: Capillary refill takes less than 2 seconds.     Findings: Rash: small scaley dime sized annular rash to right pointer finger.  Neurological:     Mental Status: She is alert and oriented to person, place, and time.  Psychiatric:        Mood and Affect: Mood normal.        Behavior: Behavior normal.     Lab Results Lab Results  Component Value Date   WBC 4.8 07/02/2019   HGB 10.7 (L) 07/02/2019   HCT 31.8 (L) 07/02/2019   MCV 93.3 07/02/2019   PLT 107 (L) 07/02/2019    Lab Results  Component  Value Date   CREATININE 0.59 07/02/2019   BUN 7 07/02/2019   NA 134 (L) 07/02/2019   K 3.9 07/02/2019   CL 105 07/02/2019   CO2 24 07/02/2019    Lab Results  Component Value Date   ALT 50 (H) 07/02/2019   AST 148 (H) 07/02/2019   ALKPHOS 198 (H) 07/02/2019   BILITOT 1.6 (H) 07/02/2019     Microbiology: Recent Results (from the past 240 hour(s))  Blood Culture (routine x 2)     Status: None (Preliminary result)   Collection Time: 06/28/19  9:28 PM   Specimen: BLOOD  Result Value Ref Range Status   Specimen Description BLOOD SITE NOT SPECIFIED  Final   Special Requests   Final    BOTTLES DRAWN AEROBIC AND ANAEROBIC Blood Culture results may not be optimal due to an excessive volume of blood received in culture bottles   Culture NO GROWTH 4 DAYS  Final   Report Status PENDING  Incomplete  Blood Culture (routine x 2)     Status: None (Preliminary result)   Collection Time: 06/28/19  9:28 PM   Specimen: BLOOD  Result Value Ref Range Status   Specimen Description BLOOD SITE NOT SPECIFIED  Final   Special Requests   Final    BOTTLES DRAWN AEROBIC AND ANAEROBIC Blood Culture results may not be optimal due to an excessive volume of blood received in culture bottles   Culture NO GROWTH 4 DAYS  Final   Report Status PENDING  Incomplete  Urine culture     Status: Abnormal   Collection Time: 06/28/19  9:53 PM     Specimen: In/Out Cath Urine  Result Value Ref Range Status   Specimen Description IN/OUT CATH URINE  Final   Special Requests   Final    NONE Performed at [facility], [address], [place], [place] [address]    Culture (A)  Final    10,000 COLONIES/mL MULTIPLE SPECIES PRESENT, SUGGEST RECOLLECTION   Report Status 06/30/2019 FINAL  Final  Respiratory Panel by RT PCR (Flu A&B, Covid) - Nasopharyngeal Swab     Status: None   Collection Time: 06/29/19 12:18 AM   Specimen: Nasopharyngeal Swab  Result Value Ref Range Status   SARS Coronavirus 2 by RT PCR NEGATIVE NEGATIVE Final    Comment: (NOTE) SARS-CoV-2 target nucleic acids are NOT DETECTED. The SARS-CoV-2 RNA is generally detectable in upper respiratoy specimens during the acute phase of infection. The lowest concentration of SARS-CoV-2 viral copies this assay can detect is 131 copies/mL. A negative result does not preclude SARS-Cov-2 infection and should not be used as the sole basis for treatment or other patient management decisions. A negative result may occur with  improper specimen collection/handling, submission of specimen other than nasopharyngeal swab, presence of viral mutation(s) within the areas targeted by this assay, and inadequate number of viral copies (<131 copies/mL). A negative result must be combined with clinical observations, patient history, and epidemiological information. The expected result is Negative. Fact Sheet for Patients:  PinkCheek.be Fact Sheet for Healthcare Providers:  GravelBags.it This test is not yet ap proved or cleared by the [place] FDA and  has been authorized for detection and/or diagnosis of SARS-CoV-2 by FDA under an Emergency Use Authorization (EUA). This EUA will remain  in effect (meaning this test can be used) for the duration of the COVID-19 declaration under Section 564(b)(1) of the Act, 21  U.S.C. section 360bbb-3(b)(1), unless the authorization is terminated or  revoked sooner.    Influenza A by PCR NEGATIVE NEGATIVE Final   Influenza B by PCR NEGATIVE NEGATIVE Final    Comment: (NOTE) The Xpert Xpress SARS-CoV-2/FLU/RSV assay is intended as an aid in  the diagnosis of influenza from Nasopharyngeal swab specimens and  should not be used as a sole basis for treatment. Nasal washings and  aspirates are unacceptable for Xpert Xpress SARS-CoV-2/FLU/RSV  testing. Fact Sheet for Patients: PinkCheek.be Fact Sheet for Healthcare Providers: GravelBags.it This test is not yet approved or cleared by the [place] FDA and  has been authorized for detection and/or diagnosis of SARS-CoV-2 by  FDA under an Emergency Use Authorization (EUA). This EUA will remain  in effect (meaning this test can be used) for the duration of the  Covid-19 declaration under Section 564(b)(1) of the Act, 21  U.S.C. section 360bbb-3(b)(1), unless the authorization is  terminated or revoked. Performed at [facility], [place] [address] [place], [place] [address]      Janene Madeira, MSN, NP-C [facility] [email or web address] Pager: [phone] Office: [phone] [place]: [phone]

## 2019-07-02 NOTE — Progress Notes (Addendum)
HEMATOLOGY-ONCOLOGY PROGRESS NOTE  SUBJECTIVE: The patient is still having a lot of pain in her right upper quadrant and right side of her chest.  Reports some mild nausea but no vomiting.  Still having diarrhea.  Daughter is at the bedside.   PHYSICAL EXAMINATION:  Vitals:   07/02/19 0600 07/02/19 0700  BP:    Pulse:    Resp: (!) 22 19  Temp:    SpO2:     Filed Weights   06/30/19 0529 07/01/19 0437 07/02/19 0359  Weight: 57 kg 57.9 kg 57.7 kg    Intake/Output from previous day: 03/22 0701 - 03/23 0700 In: 1250 [P.O.:1250] Out: 200 [Urine:200]  GENERAL:alert, no distress and comfortable ABDOMEN: Positive bowel sounds, nondistended, tenderness in the right upper quadrant, no mass NEURO: alert & oriented x 3 with fluent speech, no focal motor/sensory deficits  LABORATORY DATA:  I have reviewed the data as listed CMP Latest Ref Rng & Units 07/02/2019 07/01/2019 06/30/2019  Glucose 70 - 99 mg/dL 105(H) 101(H) 96  BUN 6 - 20 mg/dL 7 9 6   Creatinine 0.44 - 1.00 mg/dL 0.59 0.67 0.65  Sodium 135 - 145 mmol/L 134(L) 135 135  Potassium 3.5 - 5.1 mmol/L 3.9 4.0 4.0  Chloride 98 - 111 mmol/L 105 105 103  CO2 22 - 32 mmol/L 24 22 22   Calcium 8.9 - 10.3 mg/dL 8.1(L) 8.3(L) 8.6(L)  Total Protein 6.5 - 8.1 g/dL 6.6 6.5 6.7  Total Bilirubin 0.3 - 1.2 mg/dL 1.6(H) 1.6(H) 1.9(H)  Alkaline Phos 38 - 126 U/L 198(H) 188(H) 193(H)  AST 15 - 41 U/L 148(H) 142(H) 149(H)  ALT 0 - 44 U/L 50(H) 52(H) 56(H)    Lab Results  Component Value Date   WBC 4.8 07/02/2019   HGB 10.7 (L) 07/02/2019   HCT 31.8 (L) 07/02/2019   MCV 93.3 07/02/2019   PLT 107 (L) 07/02/2019   NEUTROABS 2.3 07/02/2019    DG Chest 2 View  Result Date: 06/21/2019 CLINICAL DATA:  Chest pain EXAM: CHEST - 2 VIEW COMPARISON:  None. FINDINGS: Cardiac shadow is within normal limits. Mild right basilar atelectasis is noted. No effusion is noted. No bony abnormality is seen. Aortic calcifications are noted. IMPRESSION: Mild  right basilar atelectasis. Electronically Signed   By: Inez Catalina M.D.   On: 06/21/2019 22:05   CT HEAD WO CONTRAST  Result Date: 06/29/2019 CLINICAL DATA:  Headache, intracranial hemorrhage suspected. Additional history provided: Diffuse headache with pain radiating into neck ongoing for days. EXAM: CT HEAD WITHOUT CONTRAST TECHNIQUE: Contiguous axial images were obtained from the base of the skull through the vertex without intravenous contrast. COMPARISON:  Head CT 07/03/2017, brain MRI 11/27/2016 FINDINGS: Brain: There is no evidence of acute intracranial hemorrhage, intracranial mass, midline shift or extra-axial fluid collection.No demarcated cortical infarction. Cerebral volume is normal for age. Vascular: No hyperdense vessel.  Atherosclerotic calcifications Skull: Normal. Negative for fracture or focal lesion. Sinuses/Orbits: Visualized orbits demonstrate no acute abnormality. No significant paranasal sinus disease or mastoid effusion at the imaged levels. IMPRESSION: No evidence of acute intracranial abnormality. Specifically, no evidence of acute intracranial hemorrhage. Electronically Signed   By: Kellie Simmering DO   On: 06/29/2019 08:25   CT CHEST WO CONTRAST  Result Date: 06/30/2019 CLINICAL DATA:  54 year old female with history of cancer of unknown primary origin. EXAM: CT CHEST WITHOUT CONTRAST TECHNIQUE: Multidetector CT imaging of the chest was performed following the standard protocol without IV contrast. COMPARISON:  No prior chest CT. CT the  abdomen and pelvis 06/28/2019. FINDINGS: Cardiovascular: Heart size is normal. There is no significant pericardial fluid, thickening or pericardial calcification. There is aortic atherosclerosis, as well as atherosclerosis of the great vessels of the mediastinum and the coronary arteries, including calcified atherosclerotic plaque in the left main and left anterior descending coronary arteries. Mediastinum/Nodes: Multiple prominent borderline  enlarged mediastinal lymph nodes measuring up to 9 mm in short axis, nonspecific. Esophagus is unremarkable in appearance. No axillary lymphadenopathy. Lungs/Pleura: Multiple pulmonary nodules are again noted throughout the lungs bilaterally, concerning for widespread metastatic disease. These nodules generally range from 2-8 mm in size. In addition, at the right lung base in the right lower lobe there is a mass-like area which appears related to direct invasion across the right hemidiaphragm from large liver mass, better demonstrated on prior CT the abdomen and pelvis 06/28/2019 (see that report for full details). Trace volume of right pleural fluid lying dependently. No left pleural effusion. Upper Abdomen: Large mass in the right lobe of the liver poorly demonstrated on today's noncontrast CT examination. See report from contemporaneously obtained abdominal MRI 06/29/2019 for full description of findings beneath the diaphragm. Musculoskeletal: There are no aggressive appearing lytic or blastic lesions noted in the visualized portions of the skeleton. IMPRESSION: 1. Multiple pulmonary nodules highly concerning for widespread metastatic disease to the lungs. 2. Large right lobe of the liver mass with trans diaphragmatic invasion into the lower right hemithorax involving portions of the right lower lobe. 3. Aortic atherosclerosis, in addition to left main and left anterior descending coronary artery disease. Please note that although the presence of coronary artery calcium documents the presence of coronary artery disease, the severity of this disease and any potential stenosis cannot be assessed on this non-gated CT examination. Assessment for potential risk factor modification, dietary therapy or pharmacologic therapy may be warranted, if clinically indicated. Aortic Atherosclerosis (ICD10-I70.0). Electronically Signed   By: Vinnie Langton M.D.   On: 06/30/2019 12:48   MR ABDOMEN W WO CONTRAST  Result Date:  06/30/2019 CLINICAL DATA:  54 year old female with liver lesion noted on prior CT examination. Follow-up study. EXAM: MRI ABDOMEN WITHOUT AND WITH CONTRAST TECHNIQUE: Multiplanar multisequence MR imaging of the abdomen was performed both before and after the administration of intravenous contrast. CONTRAST:  5.54mL GADAVIST GADOBUTROL 1 MMOL/ML IV SOLN COMPARISON:  No prior abdominal MRI. CT the abdomen and pelvis 06/28/2019. FINDINGS: Comment: Portions of today's examination are limited by extensive patient respiratory motion. Lower chest: Trace right pleural effusion lying dependently. Numerous small areas of increased signal intensity corresponding to pulmonary nodules noted throughout the visualize lung bases. Hepatobiliary: Liver has a slightly shrunken appearance and nodular contour, indicative of underlying cirrhosis. There is heterogeneous signal intensity throughout the liver, most evident in the right lobe of the liver where there are widespread somewhat ill-defined areas of mild T2 hyperintensity and T1 hypointensity. In the superior aspect of segment 7 of the liver there is a well-defined mass (axial image 9 of series 10 and coronal image 9 of series 4) measuring 4.4 x 4.4 x 4.1 cm, which demonstrates heterogeneous signal intensity on T1 and T2 weighted images, but generally has a peripheral rim of T2 hypointensity, and demonstrates heterogeneous internal enhancement on post gadolinium images without a specific enhancement pattern. Extending cephalad from this lesion there is additional irregular malignant appearing soft tissue which appears to directly invade the right hemidiaphragm best appreciated on coronal image 10 of series 4 and axial image 6 of series 5 where  this is estimated to measure approximately 8.6 x 7.2 x 3.3 cm. This may also directly invade the inferior aspect of the right lower lobe, although this is difficult to say for certain on today's magnetic resonance examination. Notably, the  ill-defined areas of T2 hyperintensity throughout the right lobe of the liver correspond to relatively diffuse arterial phase hyperenhancement on post gadolinium images which decreased on subsequent post gadolinium delayed imaging. Markedly heterogeneous perfusion is evident throughout the posterior aspect of the right lobe of the liver, predominantly segments 7 and 6, suggesting widespread infiltrative poorly defined neoplasm in these regions (although some of this could be accounted for by the altered perfusion throughout this portion of the liver related to portal and hepatic venous obstruction). The right branches of the portal vein appear dilated and do not demonstrate normal portal venous enhancement, with abrupt truncation of the right main portal vein best appreciated on axial image 42 of series 19), suggesting extensive tumor thrombus in the portal venous system of the right lobe of the liver. There is also incomplete opacification of the right hepatic vein (axial image 36 of series 19), suggesting additional tumor thrombus in the right hepatic vein. No intra or extrahepatic biliary ductal dilatation. Gallbladder is moderately distended with diffuse gallbladder wall edema, but no overt surrounding inflammatory changes. No filling defects in the gallbladder to suggest cholelithiasis. Pancreas: No pancreatic mass. No pancreatic ductal dilatation. No pancreatic or peripancreatic fluid collections or inflammatory changes. Spleen: Spleen appears borderline enlarged measuring 11.3 x 5.7 x 11.2 cm (estimated splenic volume of 361 mL) . Adrenals/Urinary Tract: Bilateral kidneys and adrenal glands are normal in appearance. No hydroureteronephrosis in the visualized portions of the abdomen. Stomach/Bowel: Visualized portions are unremarkable. Vascular/Lymphatic: No aneurysm identified in the visualized abdominal vasculature. Probable tumor thrombus in the right portal and hepatic venous systems, as detailed above.  Numerous serpiginous densities adjacent to the proximal stomach and distal esophagus, compatible with varices. No definite lymphadenopathy confidently identified in the abdomen. Other: No significant volume of ascites noted in the visualized portions of the peritoneal cavity. Musculoskeletal: No aggressive appearing osseous lesions are noted in the visualized portions of the skeleton. IMPRESSION: 1. Cirrhosis with probable diffusely infiltrative hepatocellular carcinoma throughout the right lobe of the liver, including a more well encapsulated area in segment 7 near the dome. This appears to extend out of the hepatic capsule invading the right hemidiaphragm which is enlarged, and potentially invading the lower right hemithorax with possible involvement of the inferior aspect of the right lower lobe. Trace right pleural effusion is likely malignant. 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 2. Multiple small pulmonary nodules scattered throughout the visualize lung bases compatible with widespread metastatic disease to the lungs, better demonstrated on chest CT 06/30/2019. 3. Moderate dilatation of the gallbladder with diffuse gallbladder wall edema. However, there are no gallstones and there is no pericholecystic fluid. This likely reflects gallbladder wall edema from intrinsic hepatic disease, and is not favored to indicate an acute cholecystitis at this time. 4. Borderline splenomegaly. 5. 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 Electronically Signed   By: Vinnie Langton M.D.   On: 06/30/2019 13:12   CT ABDOMEN PELVIS W CONTRAST  Result Date: 06/28/2019 CLINICAL DATA:  Right lower quadrant pain EXAM: CT ABDOMEN AND PELVIS WITH CONTRAST TECHNIQUE: Multidetector CT imaging of the abdomen and pelvis was performed using the standard protocol following bolus administration of intravenous contrast. CONTRAST:  130mL OMNIPAQUE IOHEXOL 300 MG/ML  SOLN COMPARISON:   Ultrasound 06/28/2019 FINDINGS: Lower chest: Lung bases demonstrate numerous pulmonary nodules measuring up to 8 mm in size concerning for metastatic disease. Heart size within normal limits. Hepatobiliary: Heterogenous liver. Subtle contour nodularity suggesting cirrhosis. No calcified gallstone. Small amount of pericholecystic fluid or wall thickening though normal appearance of the gallbladder by sonography. Large heterogenous mass that appears to be within the posterior right hepatic lobe. This measures approximately 8.2 x 6.9 by 8.8 cm and contains calcifications. The mass may invade the right diaphragm given its appearance on coronal views. Additional smaller mass measuring 2.9 cm, series 3, image number 28 more inferiorly within the right hepatic lobe. Suspect that there may be additional vague hypodense masses in the right lobe. Pancreas: Unremarkable. No pancreatic ductal dilatation or surrounding inflammatory changes. Spleen: Borderline enlarged. Adrenals/Urinary Tract: Left adrenal gland is normal. Questionable involvement of right adrenal gland by the hepatic mass lesion, series 3, image number 21. Kidneys show no hydronephrosis. The bladder is normal Stomach/Bowel: Stomach nonenlarged. No dilated small bowel. Diverticular disease of the colon. Possible mild wall thickening at the cecum and ascending colon. The appendix is negative. Vascular/Lymphatic: Moderate aortic atherosclerosis. No aneurysm. Small porta hepatis and gastrohepatic nodes measuring up to 16 mm in size, series 3, image number 30. Reproductive: Uterus and bilateral adnexa are unremarkable. Other: No free air. Small amount of ascites within the abdomen and pelvis. Diffuse hazy appearance of the mesentery. Musculoskeletal: Bones appear slightly dense but are without focal lytic or focal sclerotic process. IMPRESSION: 1. Probable liver cirrhosis. Large heterogeneous suspicion mass measuring at least 8.8 cm, probably arising from the right  hepatic lobe of the liver but extending cephalad toward the right lung base with possible violation of the capsule and invasion/involvement of the right diaphragm. At least 1 additional smaller solid mass in the inferior right hepatic lobe either representing metastatic disease or multifocal disease. Correlation with MRI should be considered. 2. Numerous pulmonary nodules consistent with metastatic disease 3. Possible gallbladder wall thickening or small amount of pericholecystic fluid as seen on recent ultrasound 4. Diffuse diverticular disease of the colon with possible wall thickening/mild acute inflammation at the cecum and ascending colon. 5. Small amount of free fluid in the pelvis. Electronically Signed   By: Donavan Foil M.D.   On: 06/28/2019 23:41   DG Chest Port 1 View  Result Date: 06/28/2019 CLINICAL DATA:  Cough and shortness of breath. EXAM: PORTABLE CHEST 1 VIEW COMPARISON:  Aug 21, 2019 FINDINGS: The cardiac silhouette is enlarged. There is vascular congestion with developing pulmonary edema. There are small bilateral pleural effusions. Aortic calcifications are noted. Bibasilar atelectasis is noted. There is no acute osseous abnormality. IMPRESSION: Findings suggestive of congestive heart failure. Electronically Signed   By: Constance Holster M.D.   On: 06/28/2019 21:43   ECHOCARDIOGRAM COMPLETE  Result Date: 06/30/2019    ECHOCARDIOGRAM REPORT   Patient Name:   Valerie [name] Date of Exam: 06/30/2019 Medical Rec #:  [id number]    Height:       62.0 in Accession #:    [id number]   Weight:       125.6 lb Date of Birth:  [birth year]    BSA:          1.569 m Patient Age:    54 years     BP:           147/95 mmHg Patient Gender: F            HR:  71 bpm. Exam Location:  Inpatient Procedure: 2D Echo, Cardiac Doppler and Color Doppler Indications:    Pulmonary hypertension 416.8/I27.2  History:        Patient has no prior history of Echocardiogram examinations.                 Risk  Factors:Current Smoker. Thrombocytopenia. Liver cirrhosis.                 SIRS. Hepatitis C. Marijuana abuse.  Sonographer:    Clayton Lefort RDCS (AE) Referring Phys: [id number] [place]  Sonographer Comments: Patient movement. Echocardiogram ended before protocol complete due to patient stating she could not tolerate laying on back or left side. Nurse notified. IMPRESSIONS  1. Limited images, incomplete study.  2. Left ventricular ejection fraction, by estimation, is 60 to 65%. The left ventricle has normal function. The left ventricle has no regional wall motion abnormalities. Left ventricular diastolic function could not be evaluated.  3. Right ventricular systolic function is normal. The right ventricular size is normal. Tricuspid regurgitation signal is inadequate for assessing PA pressure.  4. The mitral valve is grossly normal. Trivial mitral valve regurgitation.  5. The aortic valve is tricuspid. Aortic valve regurgitation is not visualized. FINDINGS  Left Ventricle: Left ventricular ejection fraction, by estimation, is 60 to 65%. The left ventricle has normal function. The left ventricle has no regional wall motion abnormalities. The left ventricular internal cavity size was normal in size. There is  borderline left ventricular hypertrophy. Left ventricular diastolic function could not be evaluated. Right Ventricle: The right ventricular size is normal. No increase in right ventricular wall thickness. Right ventricular systolic function is normal. Tricuspid regurgitation signal is inadequate for assessing PA pressure. Left Atrium: Left atrial size was normal in size. Right Atrium: Right atrial size was normal in size. Pericardium: There is no evidence of pericardial effusion. Mitral Valve: The mitral valve is grossly normal. Trivial mitral valve regurgitation. Tricuspid Valve: The tricuspid valve is grossly normal. Tricuspid valve regurgitation is mild. Aortic Valve: The aortic valve is tricuspid. Aortic  valve regurgitation is not visualized. Mild aortic valve annular calcification. Pulmonic Valve: The pulmonic valve was grossly normal. Pulmonic valve regurgitation is mild. Aorta: The aortic root is normal in size and structure. Venous: The inferior vena cava was not well visualized. IAS/Shunts: No atrial level shunt detected by color flow Doppler.  LEFT VENTRICLE PLAX 2D LVIDd:         4.90 cm LVIDs:         3.10 cm LV PW:         1.10 cm LV IVS:        0.90 cm LVOT diam:     1.80 cm LVOT Area:     2.54 cm  LEFT ATRIUM         Index LA diam:    3.60 cm 2.29 cm/m   AORTA Ao Root diam: 2.60 cm Ao Asc diam:  3.20 cm TRICUSPID VALVE TR Peak grad:   22.1 mmHg TR Vmax:        235.00 cm/s  SHUNTS Systemic Diam: 1.80 cm Rozann Lesches MD Electronically signed by Rozann Lesches MD Signature Date/Time: 06/30/2019/11:31:13 AM    Final    US Abdomen Limited RUQ  Result Date: 06/28/2019 CLINICAL DATA:  Chest, right upper quadrant pain for 3 weeks, nausea, vomiting and fevers, history of cirrhosis EXAM: ULTRASOUND ABDOMEN LIMITED RIGHT UPPER QUADRANT COMPARISON:  None. FINDINGS: Gallbladder: Mildly contracted appearance of the gallbladder.  No visible calcified gallstones, significant gallbladder wall thickening. Trace pericholecystic fluid. Sonographic Percell Miller sign is reportedly negative. Common bile duct: Diameter: 1.3 mm proximally, nondilated. Distal portion not visualized. Liver: There is a heterogeneous, centrally hyperechoic, peripherally hypoechoic lesion with a thin capsule in the right lobe liver measuring 3.9 x 4.0 x 4.6 cm. Coarsened heterogeneous background parenchymal echogenicity with a nodular hepatic surface contour compatible with history of cirrhosis. Portal vein is patent on color Doppler imaging with normal direction of blood flow towards the liver. Other: None. IMPRESSION: Under distension of the gallbladder. Trace pericholecystic fluid is nonspecific with absence of other features of acute  cholecystitis. Should be considered within the appropriate clinical setting. Heterogeneous 4.6 cm mass in the posterior right lobe liver, highly concerning for hepatocellular carcinoma given background of cirrhosis. Consider further characterization with multiphase MRI or CT, preferably on an outpatient basis when patient is better able to tolerate scanning procedure. Electronically Signed   By: Lovena Le M.D.   On: 06/28/2019 22:16    ASSESSMENT AND PLAN: 1.  Liver and lung lesions concerning for metastatic cancer, ?[place] primary  -CT 06/28/2019-probable cirrhosis, large heterogenous mass in the right liver extending to the right lung base with possible violation of the capsule and invasion of the right hemidiaphragm, at least 1 additional mass in the inferior right liver  -06/29/2019 MRI of the abdomen -probable diffusely infiltrative hepatocellular carcinoma throughout the right lobe of the liver which appears to extend out of the hepatic capsule and invade the right hemidiaphragm, tumor thrombus involving the right portal and hepatic veins, esophageal and proximal gastric varices -06/30/2019 CT of the chest without contrast -multiple pulmonary nodules concerning for widespread metastatic disease to the lungs, large right lobe liver mass with transdiaphragmatic invasion into the lower right hemithorax involving portions of the right lower lobe -07/01/2019 -AFP pending 2.  Hepatitis C -Reports prior treatment with 16 weeks of interferon ribavirin -06/29/2019 HCV RNA quantitative 634,000 3.  Cirrhosis 4.  Pseudoseizures 5.  Bipolar disorder 6.  Anemia and thrombocytopenia 7.  Elevated LFTs  Valerie [name] appears stable.  AFP is still pending.  Scans of been reviewed with radiology and they do not feel anticoagulation is needed.  Her heparin was stopped yesterday.  They have also recommended biopsy of one of her liver lesions. She is still having significant abdominal pain.  Recommendations: 1.  Await  AFP. 2.  We will try the patient on oxycodone 5 mg every 4 hours as needed for pain.  She states that she has tolerated this well in the past. 3.  We will request ultrasound-guided liver biopsy by interventional radiology. 4.  Await results of bone scan.    LOS: 3 days   Mikey Bussing, DNP, AGPCNP-BC, AOCNP 07/02/19 Valerie [name] continues to have pain.  Can try hydromorphone or morphine for pain if there is a concern about an allergic reaction to oxycodone.  I discussed the case with interventional radiology.  There is tumor thrombus involving the portal vein and hepatic vein.  They agree with discontinuing anticoagulation therapy.  The clinical presentation and imaging findings are most likely indicative of metastatic hepatocellular carcinoma, but the images do not have features to confirm a definitive diagnosis.  A liver biopsy is recommended.  She can be discharged to home when her pain is controlled with an oral regimen and Duragesic.

## 2019-07-03 ENCOUNTER — Inpatient Hospital Stay (HOSPITAL_COMMUNITY): Payer: Medicaid Other

## 2019-07-03 DIAGNOSIS — Z515 Encounter for palliative care: Secondary | ICD-10-CM

## 2019-07-03 DIAGNOSIS — Z7189 Other specified counseling: Secondary | ICD-10-CM

## 2019-07-03 DIAGNOSIS — R509 Fever, unspecified: Secondary | ICD-10-CM

## 2019-07-03 DIAGNOSIS — Z66 Do not resuscitate: Secondary | ICD-10-CM

## 2019-07-03 DIAGNOSIS — R1010 Upper abdominal pain, unspecified: Secondary | ICD-10-CM

## 2019-07-03 LAB — COMPREHENSIVE METABOLIC PANEL
ALT: 56 U/L — ABNORMAL HIGH (ref 0–44)
AST: 164 U/L — ABNORMAL HIGH (ref 15–41)
Albumin: 2.5 g/dL — ABNORMAL LOW (ref 3.5–5.0)
Alkaline Phosphatase: 211 U/L — ABNORMAL HIGH (ref 38–126)
Anion gap: 7 (ref 5–15)
BUN: <5 mg/dL — ABNORMAL LOW (ref 6–20)
CO2: 24 mmol/L (ref 22–32)
Calcium: 8.5 mg/dL — ABNORMAL LOW (ref 8.9–10.3)
Chloride: 101 mmol/L (ref 98–111)
Creatinine, Ser: 0.61 mg/dL (ref 0.44–1.00)
GFR calc Af Amer: >60 mL/min (ref >60)
GFR calc non Af Amer: >60 mL/min (ref >60)
Glucose, Bld: 107 mg/dL — ABNORMAL HIGH (ref 70–99)
Potassium: 4.1 mmol/L (ref 3.5–5.1)
Sodium: 132 mmol/L — ABNORMAL LOW (ref 135–145)
Total Bilirubin: 1.6 mg/dL — ABNORMAL HIGH (ref 0.3–1.2)
Total Protein: 6.7 g/dL (ref 6.5–8.1)

## 2019-07-03 LAB — CULTURE, BLOOD (ROUTINE X 2)
Culture: NO GROWTH
Culture: NO GROWTH

## 2019-07-03 LAB — HEPATITIS B CORE ANTIBODY, TOTAL: Hep B Core Total Ab: REACTIVE — AB

## 2019-07-03 LAB — CBC WITH DIFFERENTIAL/PLATELET
Abs Immature Granulocytes: 0.01 10*3/uL (ref 0.00–0.07)
Basophils Absolute: 0 10*3/uL (ref 0.0–0.1)
Basophils Relative: 1 %
Eosinophils Absolute: 0.1 10*3/uL (ref 0.0–0.5)
Eosinophils Relative: 2 %
HCT: 33.1 % — ABNORMAL LOW (ref 36.0–46.0)
Hemoglobin: 11.1 g/dL — ABNORMAL LOW (ref 12.0–15.0)
Immature Granulocytes: 0 %
Lymphocytes Relative: 34 %
Lymphs Abs: 1.9 10*3/uL (ref 0.7–4.0)
MCH: 31.3 pg (ref 26.0–34.0)
MCHC: 33.5 g/dL (ref 30.0–36.0)
MCV: 93.2 fL (ref 80.0–100.0)
Monocytes Absolute: 0.6 10*3/uL (ref 0.1–1.0)
Monocytes Relative: 10 %
Neutro Abs: 3 10*3/uL (ref 1.7–7.7)
Neutrophils Relative %: 53 %
Platelets: 121 10*3/uL — ABNORMAL LOW (ref 150–400)
RBC: 3.55 MIL/uL — ABNORMAL LOW (ref 3.87–5.11)
RDW: 15.4 % (ref 11.5–15.5)
WBC: 5.7 10*3/uL (ref 4.0–10.5)
nRBC: 0 % (ref 0.0–0.2)

## 2019-07-03 LAB — PHOSPHORUS: Phosphorus: 3.3 mg/dL (ref 2.5–4.6)

## 2019-07-03 LAB — MAGNESIUM: Magnesium: 1.8 mg/dL (ref 1.7–2.4)

## 2019-07-03 MED ORDER — LIDOCAINE HCL (PF) 1 % IJ SOLN
INTRAMUSCULAR | Status: AC
  Filled 2019-07-03: qty 30

## 2019-07-03 MED ORDER — PRO-STAT SUGAR FREE PO LIQD
30.0000 mL | Freq: Three times a day (TID) | ORAL | Status: DC
  Administered 2019-07-04: 30 mL via ORAL
  Filled 2019-07-03: qty 30

## 2019-07-03 MED ORDER — FENTANYL CITRATE (PF) 100 MCG/2ML IJ SOLN
INTRAMUSCULAR | Status: AC | PRN
  Administered 2019-07-03 (×2): 25 ug via INTRAVENOUS
  Administered 2019-07-03: 50 ug via INTRAVENOUS

## 2019-07-03 MED ORDER — MIDAZOLAM HCL 2 MG/2ML IJ SOLN
INTRAMUSCULAR | Status: AC | PRN
  Administered 2019-07-03: 0.5 mg via INTRAVENOUS
  Administered 2019-07-03: 1 mg via INTRAVENOUS
  Administered 2019-07-03: 0.5 mg via INTRAVENOUS

## 2019-07-03 MED ORDER — FENTANYL CITRATE (PF) 100 MCG/2ML IJ SOLN
INTRAMUSCULAR | Status: AC
  Filled 2019-07-03: qty 2

## 2019-07-03 MED ORDER — MIDAZOLAM HCL 2 MG/2ML IJ SOLN
INTRAMUSCULAR | Status: AC
  Filled 2019-07-03: qty 2

## 2019-07-03 MED ORDER — ADULT MULTIVITAMIN W/MINERALS CH
1.0000 | ORAL_TABLET | Freq: Every day | ORAL | Status: DC
  Administered 2019-07-04: 1 via ORAL
  Filled 2019-07-03: qty 1

## 2019-07-03 MED ORDER — GELATIN ABSORBABLE 12-7 MM EX MISC
CUTANEOUS | Status: AC
  Filled 2019-07-03: qty 1

## 2019-07-03 NOTE — Progress Notes (Signed)
Received from IR post liver biopsy. Pt still drowsy, puncture site with band aid, CDI, no bleeding noted. will monitor accordingly.

## 2019-07-03 NOTE — Progress Notes (Signed)
[facility] for Infectious Disease  Date of Admission:  06/28/2019       ASSESSMENT: Valerie [name] is a 54 y.o. female with chronic hepatitis C s/p failed treatment with 16 weeks interferon + riba > 15 years ago. She underwent liver biopsy today of a large mass that has progressed outside the liver capsule and likely invaded the thorax.   Will continue to discuss timing with Dr. Megan Salon and Dr. Benay Spice regarding timing of her Hepatitis C treatment.  In review of the literature, timing of hepatitis C has traditionally been delayed to achieve higher cure rates after "successful treatment of [place]", but at this point I am not certain that is possible given current concerns outlined in Dr. Gearldine Shown consultation note. However will continue to assess on a case by case basis understanding we don't currently have full staging / prognosis at this time.     PLAN: 1. Will help arrange follow up in ID clinic 2. Follow pathology from biopsy    Principal Problem:   SIRS (systemic inflammatory response syndrome) (HCC) Active Problems:   Thrombocytopenia (HCC)   Liver cirrhosis (HCC)   Elevated liver enzymes   Fever   Abdominal pain   Liver mass   . feeding supplement (ENSURE ENLIVE)  237 mL Oral BID BM  . [START ON 07/04/2019] feeding supplement (PRO-STAT SUGAR FREE 64)  30 mL Oral TID  . fentaNYL      . FLUoxetine  20 mg Oral Daily  . lidocaine (PF)      . metoprolol tartrate  5 mg Intravenous Q6H  . midazolam      . [START ON 07/04/2019] multivitamin with minerals  1 tablet Oral Daily    SUBJECTIVE: Sleepy with fentanyl patch, no pain following IR biopsy. Eating a banana.    Review of Systems: Review of Systems  Constitutional: Negative for chills and fever.  HENT: Negative for tinnitus.   Eyes: Negative for blurred vision and photophobia.  Respiratory: Negative for cough and sputum production.   Cardiovascular: Negative for chest pain.  Gastrointestinal: Negative  for diarrhea, nausea and vomiting.  Genitourinary: Negative for dysuria.  Skin: Negative for rash.  Neurological: Negative for headaches.    Allergies  Allergen Reactions  . Norco [Hydrocodone-Acetaminophen] Anaphylaxis and Shortness Of Breath  . Tylenol [Acetaminophen] Other (See Comments)    Cannot take due to liver    OBJECTIVE: Vitals:   07/03/19 1020 07/03/19 1025 07/03/19 1029 07/03/19 1208  BP: (!) 171/99 (!) 171/99 (!) 176/112 (!) 156/91  Pulse: 86 87 93 71  Resp: 13 (!) 9 (!) 21 16  Temp:    98.1 F (36.7 C)  TempSrc:    Oral  SpO2: 98% 97% 99% 98%  Weight:      Height:       Body mass index is 22.84 kg/m.  Physical Exam Constitutional:      Comments: Sitting up in bed. Two daughters are at the bedside. She is sleepy frequently during conversation.   Cardiovascular:     Rate and Rhythm: Normal rate and regular rhythm.  Pulmonary:     Effort: Pulmonary effort is normal.  Neurological:     Mental Status: She is lethargic.     Lab Results Lab Results  Component Value Date   WBC 5.7 07/03/2019   HGB 11.1 (L) 07/03/2019   HCT 33.1 (L) 07/03/2019   MCV 93.2 07/03/2019   PLT 121 (L) 07/03/2019    Lab  Results  Component Value Date   CREATININE 0.61 07/03/2019   BUN <5 (L) 07/03/2019   NA 132 (L) 07/03/2019   K 4.1 07/03/2019   CL 101 07/03/2019   CO2 24 07/03/2019    Lab Results  Component Value Date   ALT 56 (H) 07/03/2019   AST 164 (H) 07/03/2019   ALKPHOS 211 (H) 07/03/2019   BILITOT 1.6 (H) 07/03/2019     Microbiology: Recent Results (from the past 240 hour(s))  Blood Culture (routine x 2)     Status: None   Collection Time: 06/28/19  9:28 PM   Specimen: BLOOD  Result Value Ref Range Status   Specimen Description BLOOD SITE NOT SPECIFIED  Final   Special Requests   Final    BOTTLES DRAWN AEROBIC AND ANAEROBIC Blood Culture results may not be optimal due to an excessive volume of blood received in culture bottles   Culture   Final    NO  GROWTH 5 DAYS Performed at [facility], [place] [address], [place], [place] [address]    Report Status 07/03/2019 FINAL  Final  Blood Culture (routine x 2)     Status: None   Collection Time: 06/28/19  9:28 PM   Specimen: BLOOD  Result Value Ref Range Status   Specimen Description BLOOD SITE NOT SPECIFIED  Final   Special Requests   Final    BOTTLES DRAWN AEROBIC AND ANAEROBIC Blood Culture results may not be optimal due to an excessive volume of blood received in culture bottles   Culture   Final    NO GROWTH 5 DAYS Performed at [facility], [place] [address] [place], [place] [address]    Report Status 07/03/2019 FINAL  Final  Urine culture     Status: Abnormal   Collection Time: 06/28/19  9:53 PM   Specimen: In/Out Cath Urine  Result Value Ref Range Status   Specimen Description IN/OUT CATH URINE  Final   Special Requests   Final    NONE Performed at [facility], [place] [address], [place], [place] [address]    Culture (A)  Final    10,000 COLONIES/mL MULTIPLE SPECIES PRESENT, SUGGEST RECOLLECTION   Report Status 06/30/2019 FINAL  Final  Respiratory Panel by RT PCR (Flu A&B, Covid) - Nasopharyngeal Swab     Status: None   Collection Time: 06/29/19 12:18 AM   Specimen: Nasopharyngeal Swab  Result Value Ref Range Status   SARS Coronavirus 2 by RT PCR NEGATIVE NEGATIVE Final    Comment: (NOTE) SARS-CoV-2 target nucleic acids are NOT DETECTED. The SARS-CoV-2 RNA is generally detectable in upper respiratoy specimens during the acute phase of infection. The lowest concentration of SARS-CoV-2 viral copies this assay can detect is 131 copies/mL. A negative result does not preclude SARS-Cov-2 infection and should not be used as the sole basis for treatment or other patient management decisions. A negative result may occur with  improper specimen collection/handling, submission of specimen other than nasopharyngeal swab, presence of viral mutation(s) within the areas  targeted by this assay, and inadequate number of viral copies (<131 copies/mL). A negative result must be combined with clinical observations, patient history, and epidemiological information. The expected result is Negative. Fact Sheet for Patients:  PinkCheek.be Fact Sheet for Healthcare Providers:  GravelBags.it This test is not yet ap proved or cleared by the [place] FDA and  has been authorized for detection and/or diagnosis of SARS-CoV-2 by FDA under an Emergency Use Authorization (  EUA). This EUA will remain  in effect (meaning this test can be used) for the duration of the COVID-19 declaration under Section 564(b)(1) of the Act, 21 U.S.C. section 360bbb-3(b)(1), unless the authorization is terminated or revoked sooner.    Influenza A by PCR NEGATIVE NEGATIVE Final   Influenza B by PCR NEGATIVE NEGATIVE Final    Comment: (NOTE) The Xpert Xpress SARS-CoV-2/FLU/RSV assay is intended as an aid in  the diagnosis of influenza from Nasopharyngeal swab specimens and  should not be used as a sole basis for treatment. Nasal washings and  aspirates are unacceptable for Xpert Xpress SARS-CoV-2/FLU/RSV  testing. Fact Sheet for Patients: PinkCheek.be Fact Sheet for Healthcare Providers: GravelBags.it This test is not yet approved or cleared by the [place] FDA and  has been authorized for detection and/or diagnosis of SARS-CoV-2 by  FDA under an Emergency Use Authorization (EUA). This EUA will remain  in effect (meaning this test can be used) for the duration of the  Covid-19 declaration under Section 564(b)(1) of the Act, 21  U.S.C. section 360bbb-3(b)(1), unless the authorization is  terminated or revoked. Performed at [facility], [place] [address] [place], [place] [address]     Janene Madeira, MSN, NP-C [facility] [email or web address] Pager: [phone] Office: [phone] [place]: [phone]

## 2019-07-03 NOTE — Consult Note (Signed)
Consultation Note Date: 07/03/2019   Patient Name: Valerie [name]  DOB: 01/20/1966  MRN: [id number]  Age / Sex: 54 y.o., female   PCP: Patient, No Pcp Per Referring Physician: Damita Lack, MD   REASON FOR CONSULTATION:Establishing goals of care  Palliative Care consult requested for goals of care discussion in this 54 y.o. female with multiple medical problems including pseudoseizures, bipolar, liver cirrhosis, and Hepatitis C.  [name] presented to ED with complaints of abdominal pain, nausea, vomiting, and diarrhea over the past 3 weeks. Also complained of radiating neck pain. She reports several ED visits for similar symptoms with recent visit due to worsening symptoms. During ED work-up was found with temperature of 101.5, tachycardic, and elevated LFTs. COVID test negative. CT abdomen showed concerns forhepatocellular carcinoma along the right hepatic lobe with also possible cecal and ascending colon colitis and also possible metastatic lesions in the lung involving the diaphragm. Since admission patient has been evaluated and under the care of Oncology. S/P liver biopsy. Also being followed by Infectious Disease with pending work-up for Hepatitis C (viral load >600,000.   Clinical Assessment and Goals of Care: I have reviewed medical records including lab results, imaging, Epic notes, and MAR, received report from the bedside RN, and assessed the patient. I met at the bedside with patient and her 2 daughters, Arturo Morton and Severiano Gilbert to discuss diagnosis prognosis, [place], EOL wishes, disposition and options.  [name] is initially somewhat somnolent in the setting of s/p biopsy and fentanyl patch which has been discontinued. She easily arouses and will remain awake, with some drifting off to sleep during discussion. She is A&O x3, denies pain or shortness of breath. She provides verbal consent to have discussions with daughter. She is fully awake and alert eating some  fruit by the end of goals of care.   I introduced Palliative Medicine as specialized medical care for people living with serious illness. It focuses on providing relief from the symptoms and stress of a serious illness. The goal is to improve quality of life for both the patient and the family. Both patient and daughters verbalized understanding and appreciation in our support.   We discussed a brief life review of the patient, along with her unctional and nutritional status. Ms. Nodal reports she lives with her daughter, Valerie [name]. She has 6 daughters (3 local and 3 out of state). Patient reports she moved to [place] about 5 years ago from [place]. 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   Prior to admission patient reports ability to provife ADLs independently. She reports frequent leg cramps during the night. Daughter states patient has not drove in over 3 weeks due to some seizure activity. Patient shares she has not seen a doctor in over 5 years, but had recently set up an appointment for April 12th at the [facility] center to establish care. She and daughter endorses changes in weight loss over the past month with increased fatigue. Daughters report an approximate weight loss of at least 40lbs over the past 3-6 months.   We discussed Her current illness and what it means in the larger context of Her on-going co-morbidities. Natural disease trajectory and expectations at EOL were discussed.  Patient and daughter verbalized understanding of patient current illness and newly diagnosed cancer. They are remaining hopeful that once biopsy results are back they can begin treatment based on recommendations from Oncology. Daughters and  patient are concerned about her frequent pain and if she will be able to return to work. We discussed at this time it would be recommended for her to began completing paperwork for leave of absence to allow for direct  focus on her care and comfort (pain control).   Patient states she had pain relief with fentanyl patch however, daughters were concerned with the increased drowsiness. We discussed her current pain regimen and allowing time to see if the Tramadol and muscle relaxants will provide appropriate relief. Patient and daughters verbalized understanding and agreement. Patient does express she has tolerated Oxycodone in the past if needed.   I attempted to elicit values and goals of care important to the patient.    Ms. Wickersham and her daughters express their goals are for patient to undergo all treatment options allowing her an opportunity to show some improvement/stability.   Patient and daughters confirm DNR/DNI. Patient does not have a documented advanced directive. She reports her children will jointly make decisions.   Palliative Care services outpatient were explained and offered given patient and daughters expressed wishes for continued full scope and aggressive care. Patient and family verbalized their understanding and awareness of  Palliative's goals and philosophy of care. They are requesting outpatient support.   Questions and concerns were addressed.  The family was encouraged to call with questions or concerns.  PMT will continue to support holistically.   SOCIAL HISTORY:     reports that she has been smoking cigarettes. She has been smoking about 0.50 packs per day. She has never used smokeless tobacco. She reports that she does not drink alcohol or use drugs.  CODE STATUS: DNR  ADVANCE DIRECTIVES: Next of Kin     SYMPTOM MANAGEMENT: per medical team   Palliative Prophylaxis:   Bowel Regimen and Frequent Pain Assessment  PSYCHO-SOCIAL/SPIRITUAL:  Support System: Family  Desire for further Chaplaincy support: NO   Additional Recommendations (Limitations, Scope, Preferences):  Full Scope Treatment and DNR   PAST MEDICAL HISTORY: Past Medical History:  Diagnosis Date  .  Bipolar disorder ([place])   . Hepatitis C   . Liver cirrhosis ([place])   . Pseudoseizures     PAST SURGICAL HISTORY:  Past Surgical History:  Procedure Laterality Date  . CESAREAN SECTION      ALLERGIES:  is allergic to norco [hydrocodone-acetaminophen] and tylenol [acetaminophen].   MEDICATIONS:  Current Facility-Administered Medications  Medication Dose Route Frequency Provider Last Rate Last Admin  . dextrose 5 %-0.9 % sodium chloride infusion   Intravenous Continuous Allie Bossier, MD 75 mL/hr at 07/03/19 1225 New Bag at 07/03/19 1225  . feeding supplement (ENSURE ENLIVE) (ENSURE ENLIVE) liquid 237 mL  237 mL Oral BID BM Allie Bossier, MD   237 mL at 07/03/19 1113  . [START ON 07/04/2019] feeding supplement (PRO-STAT SUGAR FREE 64) liquid 30 mL  30 mL Oral TID Amin, Ankit Chirag, MD      . fentaNYL (SUBLIMAZE) 100 MCG/2ML injection           . FLUoxetine (PROZAC) capsule 20 mg  20 mg Oral Daily Allie Bossier, MD   20 mg at 07/03/19 1114  . hydrALAZINE (APRESOLINE) injection 5 mg  5 mg Intravenous Q6H PRN Allie Bossier, MD   5 mg at 07/02/19 2005  . lidocaine (PF) (XYLOCAINE) 1 % injection           . methocarbamol (ROBAXIN) tablet 500 mg  500 mg Oral Q8H PRN Dia Crawford  J, MD      . metoprolol tartrate (LOPRESSOR) injection 5 mg  5 mg Intravenous Q6H Allie Bossier, MD   5 mg at 07/03/19 1112  . midazolam (VERSED) 2 MG/2ML injection           . [START ON 07/04/2019] multivitamin with minerals tablet 1 tablet  1 tablet Oral Daily Amin, Ankit Chirag, MD      . ondansetron (ZOFRAN) tablet 4 mg  4 mg Oral Q6H PRN Rise Patience, MD       Or  . ondansetron [facility]) injection 4 mg  4 mg Intravenous Q6H PRN Rise Patience, MD   4 mg at 06/30/19 1712  . traMADol (ULTRAM) tablet 50 mg  50 mg Oral Q6H PRN Rise Patience, MD   50 mg at 07/03/19 0407    VITAL SIGNS: BP (!) 156/91 (BP Location: Left Arm)   Pulse 71   Temp 98.1 F (36.7 C) (Oral)   Resp 16   Ht 5'  2" (1.575 m)   Wt 56.7 kg Comment: scale c  SpO2 98%   BMI 22.84 kg/m  Filed Weights   07/01/19 0437 07/02/19 0359 07/03/19 0544  Weight: 57.9 kg 57.7 kg 56.7 kg    Estimated body mass index is 22.84 kg/m as calculated from the following:   Height as of this encounter: 5' 2" (1.575 m).   Weight as of this encounter: 56.7 kg.  LABS: CBC:    Component Value Date/Time   WBC 5.7 07/03/2019 0438   HGB 11.1 (L) 07/03/2019 0438   HCT 33.1 (L) 07/03/2019 0438   PLT 121 (L) 07/03/2019 0438   Comprehensive Metabolic Panel:    Component Value Date/Time   NA 132 (L) 07/03/2019 0438   K 4.1 07/03/2019 0438   CO2 24 07/03/2019 0438   BUN <5 (L) 07/03/2019 0438   CREATININE 0.61 07/03/2019 0438   ALBUMIN 2.5 (L) 07/03/2019 0438     Review of Systems  Constitutional: Positive for appetite change, fatigue and unexpected weight change.  Gastrointestinal: Positive for abdominal pain.  Musculoskeletal: Positive for neck pain.  Unless otherwise noted, a complete review of systems is negative.  Physical Exam General: somnolent, NAD, cachectic, frail   Cardiovascular: regular rate and rhythm Pulmonary: clear ant fields Abdomen: soft, tender, s/p liver biopsy, + bowel sounds Extremities: no edema, no joint deformities Neurological: somnolent but easily aroused, A&O x3, mood appropriate    Prognosis: Guarded to Poor   Discharge Planning:  Home with Palliative Services  Recommendations:  DNR/DNI-as confirmed by patient and daughters  Continue current plan of care per medical team  Patient and daughter requesting full scope/aggressive medical care. They are remaining hopeful for some improvement/stability.   Requesting outpatient palliative support (referral placed)  Discussed pain regiment with Dr. Reesa Chew. Fentanyl was discontinued due to concerns for increased lethargy. Patient does express pain relief when wearing patch. In the future could consider restarting for a more  long-term continued support. Would recommend starting at 12.43m vs. 279mto see how she tolerates. Will continue with current regimen of Tramadol/Robaxin to see if this provides appropriate relief. If not could consider restarting Oxy IR (if no true reaction) PRN.   PMT will continue to support and follow as needed.    Palliative Performance Scale: PPS 20-30%              Patient and daughters expressed understanding and was in agreement with this plan.   Thank you for  allowing the Palliative Medicine Team to assist in the care of this patient.  Time In: 1194 Time Out: 1550 Time Total: 65 min.   Visit consisted of counseling and education dealing with the complex and emotionally intense issues of symptom management and palliative care in the setting of serious and potentially life-threatening illness.Greater than 50%  of this time was spent counseling and coordinating care related to the above assessment and plan.  Signed by:  Alda Lea, AGPCNP-BC Palliative Medicine Team  Phone: [phone] Fax: [phone] Pager: [phone] Amion: Bjorn Pippin

## 2019-07-03 NOTE — Progress Notes (Signed)
HEMATOLOGY-ONCOLOGY PROGRESS NOTE  SUBJECTIVE: She continues to have intermittent right abdomen and right upper chest/neck discomfort.  She reports improvement with the Duragesic patch.  PHYSICAL EXAMINATION:  Vitals:   07/03/19 1029 07/03/19 1208  BP: (!) 176/112 (!) 156/91  Pulse: 93 71  Resp: (!) 21 16  Temp:  98.1 F (36.7 C)  SpO2: 99% 98%   Filed Weights   07/01/19 0437 07/02/19 0359 07/03/19 0544  Weight: 127 lb 9.6 oz (57.9 kg) 127 lb 3.2 oz (57.7 kg) 124 lb 14.4 oz (56.7 kg)    Intake/Output from previous day: 03/23 0701 - 03/24 0700 In: 330 [P.O.:330] Out: 400 [Urine:400]  GENERAL:alert ABDOMEN: No hepatomegaly, no mass   LABORATORY DATA:  I have reviewed the data as listed CMP Latest Ref Rng & Units 07/03/2019 07/02/2019 07/01/2019  Glucose 70 - 99 mg/dL 107(H) 105(H) 101(H)  BUN 6 - 20 mg/dL <5(L) 7 9  Creatinine 0.44 - 1.00 mg/dL 0.61 0.59 0.67  Sodium 135 - 145 mmol/L 132(L) 134(L) 135  Potassium 3.5 - 5.1 mmol/L 4.1 3.9 4.0  Chloride 98 - 111 mmol/L 101 105 105  CO2 22 - 32 mmol/L 24 24 22   Calcium 8.9 - 10.3 mg/dL 8.5(L) 8.1(L) 8.3(L)  Total Protein 6.5 - 8.1 g/dL 6.7 6.6 6.5  Total Bilirubin 0.3 - 1.2 mg/dL 1.6(H) 1.6(H) 1.6(H)  Alkaline Phos 38 - 126 U/L 211(H) 198(H) 188(H)  AST 15 - 41 U/L 164(H) 148(H) 142(H)  ALT 0 - 44 U/L 56(H) 50(H) 52(H)    Lab Results  Component Value Date   WBC 5.7 07/03/2019   HGB 11.1 (L) 07/03/2019   HCT 33.1 (L) 07/03/2019   MCV 93.2 07/03/2019   PLT 121 (L) 07/03/2019   NEUTROABS 3.0 07/03/2019    DG Chest 2 View  Result Date: 06/21/2019 CLINICAL DATA:  Chest pain EXAM: CHEST - 2 VIEW COMPARISON:  None. FINDINGS: Cardiac shadow is within normal limits. Mild right basilar atelectasis is noted. No effusion is noted. No bony abnormality is seen. Aortic calcifications are noted. IMPRESSION: Mild right basilar atelectasis. Electronically Signed   By: Inez Catalina M.D.   On: 06/21/2019 22:05   CT HEAD WO  CONTRAST  Result Date: 06/29/2019 CLINICAL DATA:  Headache, intracranial hemorrhage suspected. Additional history provided: Diffuse headache with pain radiating into neck ongoing for days. EXAM: CT HEAD WITHOUT CONTRAST TECHNIQUE: Contiguous axial images were obtained from the base of the skull through the vertex without intravenous contrast. COMPARISON:  Head CT 07/03/2017, brain MRI 11/27/2016 FINDINGS: Brain: There is no evidence of acute intracranial hemorrhage, intracranial mass, midline shift or extra-axial fluid collection.No demarcated cortical infarction. Cerebral volume is normal for age. Vascular: No hyperdense vessel.  Atherosclerotic calcifications Skull: Normal. Negative for fracture or focal lesion. Sinuses/Orbits: Visualized orbits demonstrate no acute abnormality. No significant paranasal sinus disease or mastoid effusion at the imaged levels. IMPRESSION: No evidence of acute intracranial abnormality. Specifically, no evidence of acute intracranial hemorrhage. Electronically Signed   By: Kellie Simmering DO   On: 06/29/2019 08:25   CT CHEST WO CONTRAST  Result Date: 06/30/2019 CLINICAL DATA:  54 year old female with history of cancer of unknown primary origin. EXAM: CT CHEST WITHOUT CONTRAST TECHNIQUE: Multidetector CT imaging of the chest was performed following the standard protocol without IV contrast. COMPARISON:  No prior chest CT. CT the abdomen and pelvis 06/28/2019. FINDINGS: Cardiovascular: Heart size is normal. There is no significant pericardial fluid, thickening or pericardial calcification. There is aortic atherosclerosis, as well  as atherosclerosis of the great vessels of the mediastinum and the coronary arteries, including calcified atherosclerotic plaque in the left main and left anterior descending coronary arteries. Mediastinum/Nodes: Multiple prominent borderline enlarged mediastinal lymph nodes measuring up to 9 mm in short axis, nonspecific. Esophagus is unremarkable in  appearance. No axillary lymphadenopathy. Lungs/Pleura: Multiple pulmonary nodules are again noted throughout the lungs bilaterally, concerning for widespread metastatic disease. These nodules generally range from 2-8 mm in size. In addition, at the right lung base in the right lower lobe there is a mass-like area which appears related to direct invasion across the right hemidiaphragm from large liver mass, better demonstrated on prior CT the abdomen and pelvis 06/28/2019 (see that report for full details). Trace volume of right pleural fluid lying dependently. No left pleural effusion. Upper Abdomen: Large mass in the right lobe of the liver poorly demonstrated on today's noncontrast CT examination. See report from contemporaneously obtained abdominal MRI 06/29/2019 for full description of findings beneath the diaphragm. Musculoskeletal: There are no aggressive appearing lytic or blastic lesions noted in the visualized portions of the skeleton. IMPRESSION: 1. Multiple pulmonary nodules highly concerning for widespread metastatic disease to the lungs. 2. Large right lobe of the liver mass with trans diaphragmatic invasion into the lower right hemithorax involving portions of the right lower lobe. 3. Aortic atherosclerosis, in addition to left main and left anterior descending coronary artery disease. Please note that although the presence of coronary artery calcium documents the presence of coronary artery disease, the severity of this disease and any potential stenosis cannot be assessed on this non-gated CT examination. Assessment for potential risk factor modification, dietary therapy or pharmacologic therapy may be warranted, if clinically indicated. Aortic Atherosclerosis (ICD10-I70.0). Electronically Signed   By: Vinnie Langton M.D.   On: 06/30/2019 12:48   MR ABDOMEN W WO CONTRAST  Result Date: 06/30/2019 CLINICAL DATA:  54 year old female with liver lesion noted on prior CT examination. Follow-up study.  EXAM: MRI ABDOMEN WITHOUT AND WITH CONTRAST TECHNIQUE: Multiplanar multisequence MR imaging of the abdomen was performed both before and after the administration of intravenous contrast. CONTRAST:  5.20mL GADAVIST GADOBUTROL 1 MMOL/ML IV SOLN COMPARISON:  No prior abdominal MRI. CT the abdomen and pelvis 06/28/2019. FINDINGS: Comment: Portions of today's examination are limited by extensive patient respiratory motion. Lower chest: Trace right pleural effusion lying dependently. Numerous small areas of increased signal intensity corresponding to pulmonary nodules noted throughout the visualize lung bases. Hepatobiliary: Liver has a slightly shrunken appearance and nodular contour, indicative of underlying cirrhosis. There is heterogeneous signal intensity throughout the liver, most evident in the right lobe of the liver where there are widespread somewhat ill-defined areas of mild T2 hyperintensity and T1 hypointensity. In the superior aspect of segment 7 of the liver there is a well-defined mass (axial image 9 of series 10 and coronal image 9 of series 4) measuring 4.4 x 4.4 x 4.1 cm, which demonstrates heterogeneous signal intensity on T1 and T2 weighted images, but generally has a peripheral rim of T2 hypointensity, and demonstrates heterogeneous internal enhancement on post gadolinium images without a specific enhancement pattern. Extending cephalad from this lesion there is additional irregular malignant appearing soft tissue which appears to directly invade the right hemidiaphragm best appreciated on coronal image 10 of series 4 and axial image 6 of series 5 where this is estimated to measure approximately 8.6 x 7.2 x 3.3 cm. This may also directly invade the inferior aspect of the right lower lobe, although  this is difficult to say for certain on today's magnetic resonance examination. Notably, the ill-defined areas of T2 hyperintensity throughout the right lobe of the liver correspond to relatively diffuse  arterial phase hyperenhancement on post gadolinium images which decreased on subsequent post gadolinium delayed imaging. Markedly heterogeneous perfusion is evident throughout the posterior aspect of the right lobe of the liver, predominantly segments 7 and 6, suggesting widespread infiltrative poorly defined neoplasm in these regions (although some of this could be accounted for by the altered perfusion throughout this portion of the liver related to portal and hepatic venous obstruction). The right branches of the portal vein appear dilated and do not demonstrate normal portal venous enhancement, with abrupt truncation of the right main portal vein best appreciated on axial image 42 of series 19), suggesting extensive tumor thrombus in the portal venous system of the right lobe of the liver. There is also incomplete opacification of the right hepatic vein (axial image 36 of series 19), suggesting additional tumor thrombus in the right hepatic vein. No intra or extrahepatic biliary ductal dilatation. Gallbladder is moderately distended with diffuse gallbladder wall edema, but no overt surrounding inflammatory changes. No filling defects in the gallbladder to suggest cholelithiasis. Pancreas: No pancreatic mass. No pancreatic ductal dilatation. No pancreatic or peripancreatic fluid collections or inflammatory changes. Spleen: Spleen appears borderline enlarged measuring 11.3 x 5.7 x 11.2 cm (estimated splenic volume of 361 mL) . Adrenals/Urinary Tract: Bilateral kidneys and adrenal glands are normal in appearance. No hydroureteronephrosis in the visualized portions of the abdomen. Stomach/Bowel: Visualized portions are unremarkable. Vascular/Lymphatic: No aneurysm identified in the visualized abdominal vasculature. Probable tumor thrombus in the right portal and hepatic venous systems, as detailed above. Numerous serpiginous densities adjacent to the proximal stomach and distal esophagus, compatible with varices. No  definite lymphadenopathy confidently identified in the abdomen. Other: No significant volume of ascites noted in the visualized portions of the peritoneal cavity. Musculoskeletal: No aggressive appearing osseous lesions are noted in the visualized portions of the skeleton. IMPRESSION: 1. Cirrhosis with probable diffusely infiltrative hepatocellular carcinoma throughout the right lobe of the liver, including a more well encapsulated area in segment 7 near the dome. This appears to extend out of the hepatic capsule invading the right hemidiaphragm which is enlarged, and potentially invading the lower right hemithorax with possible involvement of the inferior aspect of the right lower lobe. Trace right pleural effusion is likely malignant. 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 2. Multiple small pulmonary nodules scattered throughout the visualize lung bases compatible with widespread metastatic disease to the lungs, better demonstrated on chest CT 06/30/2019. 3. Moderate dilatation of the gallbladder with diffuse gallbladder wall edema. However, there are no gallstones and there is no pericholecystic fluid. This likely reflects gallbladder wall edema from intrinsic hepatic disease, and is not favored to indicate an acute cholecystitis at this time. 4. Borderline splenomegaly. 5. 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 Electronically Signed   By: Vinnie Langton M.D.   On: 06/30/2019 13:12   NM Bone Scan Whole Body  Result Date: 07/02/2019 CLINICAL DATA:  Unknown primary malignancy. EXAM: NUCLEAR MEDICINE WHOLE BODY BONE SCAN TECHNIQUE: Whole body anterior and posterior images were obtained approximately 3 hours after intravenous injection of radiopharmaceutical. RADIOPHARMACEUTICALS:  21.9 mCi Technetium-7m MDP IV COMPARISON:  None. FINDINGS: Degenerative changes are seen involving both shoulders. No other areas of abnormal uptake are noted. IMPRESSION:  No definite scintigraphic evidence of osseous metastases. Electronically Signed  By: Marijo Conception M.D.   On: 07/02/2019 14:56   CT ABDOMEN PELVIS W CONTRAST  Result Date: 06/28/2019 CLINICAL DATA:  Right lower quadrant pain EXAM: CT ABDOMEN AND PELVIS WITH CONTRAST TECHNIQUE: Multidetector CT imaging of the abdomen and pelvis was performed using the standard protocol following bolus administration of intravenous contrast. CONTRAST:  19mL OMNIPAQUE IOHEXOL 300 MG/ML  SOLN COMPARISON:  Ultrasound 06/28/2019 FINDINGS: Lower chest: Lung bases demonstrate numerous pulmonary nodules measuring up to 8 mm in size concerning for metastatic disease. Heart size within normal limits. Hepatobiliary: Heterogenous liver. Subtle contour nodularity suggesting cirrhosis. No calcified gallstone. Small amount of pericholecystic fluid or wall thickening though normal appearance of the gallbladder by sonography. Large heterogenous mass that appears to be within the posterior right hepatic lobe. This measures approximately 8.2 x 6.9 by 8.8 cm and contains calcifications. The mass may invade the right diaphragm given its appearance on coronal views. Additional smaller mass measuring 2.9 cm, series 3, image number 28 more inferiorly within the right hepatic lobe. Suspect that there may be additional vague hypodense masses in the right lobe. Pancreas: Unremarkable. No pancreatic ductal dilatation or surrounding inflammatory changes. Spleen: Borderline enlarged. Adrenals/Urinary Tract: Left adrenal gland is normal. Questionable involvement of right adrenal gland by the hepatic mass lesion, series 3, image number 21. Kidneys show no hydronephrosis. The bladder is normal Stomach/Bowel: Stomach nonenlarged. No dilated small bowel. Diverticular disease of the colon. Possible mild wall thickening at the cecum and ascending colon. The appendix is negative. Vascular/Lymphatic: Moderate aortic atherosclerosis. No aneurysm. Small porta  hepatis and gastrohepatic nodes measuring up to 16 mm in size, series 3, image number 30. Reproductive: Uterus and bilateral adnexa are unremarkable. Other: No free air. Small amount of ascites within the abdomen and pelvis. Diffuse hazy appearance of the mesentery. Musculoskeletal: Bones appear slightly dense but are without focal lytic or focal sclerotic process. IMPRESSION: 1. Probable liver cirrhosis. Large heterogeneous suspicion mass measuring at least 8.8 cm, probably arising from the right hepatic lobe of the liver but extending cephalad toward the right lung base with possible violation of the capsule and invasion/involvement of the right diaphragm. At least 1 additional smaller solid mass in the inferior right hepatic lobe either representing metastatic disease or multifocal disease. Correlation with MRI should be considered. 2. Numerous pulmonary nodules consistent with metastatic disease 3. Possible gallbladder wall thickening or small amount of pericholecystic fluid as seen on recent ultrasound 4. Diffuse diverticular disease of the colon with possible wall thickening/mild acute inflammation at the cecum and ascending colon. 5. Small amount of free fluid in the pelvis. Electronically Signed   By: Donavan Foil M.D.   On: 06/28/2019 23:41   US BIOPSY (LIVER)  Result Date: 07/03/2019 CLINICAL DATA:  Right hepatic mass EXAM: ULTRASOUND-GUIDED CORE LIVER LESION BIOPSY TECHNIQUE: An ultrasound guided liver biopsy was thoroughly discussed with the patient and questions were answered. The benefits, risks, alternatives, and complications were also discussed. The patient understands and wishes to proceed with the procedure. A verbal as well as written consent was obtained. Survey ultrasound of the liver was performed, the lesion identified, and an appropriate skin entry site was determined. Skin site was marked, prepped with chlorhexidine, and draped in usual sterile fashion, and infiltrated locally with 1%  lidocaine. Intravenous Fentanyl 14mcg and Versed 2mg  were administered as conscious sedation during continuous monitoring of the patient's level of consciousness and physiological / cardiorespiratory status by the radiology RN, with a total moderate sedation time  of 11 minutes. A 17 gauge trocar needle was advanced under ultrasound guidance into the liver to the margin of the lesion. Multiple coaxial 18gauge core samples were then obtained through the guide needle. The guide needle was removed. Post procedure scans demonstrate no apparent complication. COMPLICATIONS: COMPLICATIONS None immediate FINDINGS: Echogenic posterior right hepatic lobe liver lesion was localized. Representative core biopsy samples obtained as above. IMPRESSION: 1. Technically successful ultrasound guided core liver lesion biopsy. Electronically Signed   By: Lucrezia Europe M.D.   On: 07/03/2019 11:18   DG Chest Port 1 View  Result Date: 06/28/2019 CLINICAL DATA:  Cough and shortness of breath. EXAM: PORTABLE CHEST 1 VIEW COMPARISON:  Aug 21, 2019 FINDINGS: The cardiac silhouette is enlarged. There is vascular congestion with developing pulmonary edema. There are small bilateral pleural effusions. Aortic calcifications are noted. Bibasilar atelectasis is noted. There is no acute osseous abnormality. IMPRESSION: Findings suggestive of congestive heart failure. Electronically Signed   By: Constance Holster M.D.   On: 06/28/2019 21:43   ECHOCARDIOGRAM COMPLETE  Result Date: 06/30/2019    ECHOCARDIOGRAM REPORT   Patient Name:   CASIE STURGEON Date of Exam: 06/30/2019 Medical Rec #:  [id number]    Height:       62.0 in Accession #:    [id number]   Weight:       125.6 lb Date of Birth:  09/18/1965    BSA:          1.569 m Patient Age:    54 years     BP:           147/95 mmHg Patient Gender: F            HR:           71 bpm. Exam Location:  Inpatient Procedure: 2D Echo, Cardiac Doppler and Color Doppler Indications:    Pulmonary hypertension  416.8/I27.2  History:        Patient has no prior history of Echocardiogram examinations.                 Risk Factors:Current Smoker. Thrombocytopenia. Liver cirrhosis.                 SIRS. Hepatitis C. Marijuana abuse.  Sonographer:    Clayton Lefort RDCS (AE) Referring Phys: [id number] [place]  Sonographer Comments: Patient movement. Echocardiogram ended before protocol complete due to patient stating she could not tolerate laying on back or left side. Nurse notified. IMPRESSIONS  1. Limited images, incomplete study.  2. Left ventricular ejection fraction, by estimation, is 60 to 65%. The left ventricle has normal function. The left ventricle has no regional wall motion abnormalities. Left ventricular diastolic function could not be evaluated.  3. Right ventricular systolic function is normal. The right ventricular size is normal. Tricuspid regurgitation signal is inadequate for assessing PA pressure.  4. The mitral valve is grossly normal. Trivial mitral valve regurgitation.  5. The aortic valve is tricuspid. Aortic valve regurgitation is not visualized. FINDINGS  Left Ventricle: Left ventricular ejection fraction, by estimation, is 60 to 65%. The left ventricle has normal function. The left ventricle has no regional wall motion abnormalities. The left ventricular internal cavity size was normal in size. There is  borderline left ventricular hypertrophy. Left ventricular diastolic function could not be evaluated. Right Ventricle: The right ventricular size is normal. No increase in right ventricular wall thickness. Right ventricular systolic function is normal. Tricuspid regurgitation signal is inadequate for assessing PA pressure.  Left Atrium: Left atrial size was normal in size. Right Atrium: Right atrial size was normal in size. Pericardium: There is no evidence of pericardial effusion. Mitral Valve: The mitral valve is grossly normal. Trivial mitral valve regurgitation. Tricuspid Valve: The tricuspid valve  is grossly normal. Tricuspid valve regurgitation is mild. Aortic Valve: The aortic valve is tricuspid. Aortic valve regurgitation is not visualized. Mild aortic valve annular calcification. Pulmonic Valve: The pulmonic valve was grossly normal. Pulmonic valve regurgitation is mild. Aorta: The aortic root is normal in size and structure. Venous: The inferior vena cava was not well visualized. IAS/Shunts: No atrial level shunt detected by color flow Doppler.  LEFT VENTRICLE PLAX 2D LVIDd:         4.90 cm LVIDs:         3.10 cm LV PW:         1.10 cm LV IVS:        0.90 cm LVOT diam:     1.80 cm LVOT Area:     2.54 cm  LEFT ATRIUM         Index LA diam:    3.60 cm 2.29 cm/m   AORTA Ao Root diam: 2.60 cm Ao Asc diam:  3.20 cm TRICUSPID VALVE TR Peak grad:   22.1 mmHg TR Vmax:        235.00 cm/s  SHUNTS Systemic Diam: 1.80 cm Rozann Lesches MD Electronically signed by Rozann Lesches MD Signature Date/Time: 06/30/2019/11:31:13 AM    Final    US Abdomen Limited RUQ  Result Date: 06/28/2019 CLINICAL DATA:  Chest, right upper quadrant pain for 3 weeks, nausea, vomiting and fevers, history of cirrhosis EXAM: ULTRASOUND ABDOMEN LIMITED RIGHT UPPER QUADRANT COMPARISON:  None. FINDINGS: Gallbladder: Mildly contracted appearance of the gallbladder. No visible calcified gallstones, significant gallbladder wall thickening. Trace pericholecystic fluid. Sonographic Percell Miller sign is reportedly negative. Common bile duct: Diameter: 1.3 mm proximally, nondilated. Distal portion not visualized. Liver: There is a heterogeneous, centrally hyperechoic, peripherally hypoechoic lesion with a thin capsule in the right lobe liver measuring 3.9 x 4.0 x 4.6 cm. Coarsened heterogeneous background parenchymal echogenicity with a nodular hepatic surface contour compatible with history of cirrhosis. Portal vein is patent on color Doppler imaging with normal direction of blood flow towards the liver. Other: None. IMPRESSION: Under distension of  the gallbladder. Trace pericholecystic fluid is nonspecific with absence of other features of acute cholecystitis. Should be considered within the appropriate clinical setting. Heterogeneous 4.6 cm mass in the posterior right lobe liver, highly concerning for hepatocellular carcinoma given background of cirrhosis. Consider further characterization with multiphase MRI or CT, preferably on an outpatient basis when patient is better able to tolerate scanning procedure. Electronically Signed   By: Lovena Le M.D.   On: 06/28/2019 22:16    ASSESSMENT AND PLAN: 1.  Liver and lung lesions concerning for metastatic cancer, ?[facility] primary  -CT 06/28/2019-probable cirrhosis, large heterogenous mass in the right liver extending to the right lung base with possible violation of the capsule and invasion of the right hemidiaphragm, at least 1 additional mass in the inferior right liver  -06/29/2019 MRI of the abdomen -probable diffusely infiltrative hepatocellular carcinoma throughout the right lobe of the liver which appears to extend out of the hepatic capsule and invade the right hemidiaphragm, tumor thrombus involving the right portal and hepatic veins, esophageal and proximal gastric varices -06/30/2019 CT of the chest without contrast -multiple pulmonary nodules concerning for widespread metastatic disease to the lungs, large right lobe  liver mass with transdiaphragmatic invasion into the lower right hemithorax involving portions of the right lower lobe -07/01/2019 -AFP pending 2.  Hepatitis C -Reports prior treatment with 16 weeks of interferon ribavirin -06/29/2019 HCV RNA quantitative 634,000 3.  Cirrhosis 4.  Pseudoseizures 5.  Bipolar disorder 6.  Anemia and thrombocytopenia 7.  Elevated LFTs  Ms. Perea appears unchanged.  Drowsiness has been noted since she started fentanyl.  I agree with the plan for tramadol.  We can consider low-dose hydromorphone or oxycodone if the pain is not relieved with tramadol.   She reports tolerating oxycodone and hydromorphone well in the past. I discussed the treatment plan with Ms. Naida Sleight.  She says she will be able to come to [facility] for outpatient therapy.  She will undergo a diagnostic liver biopsy today.  The plan is to begin outpatient atezolizumab/bevacizumab within the next 1-2 weeks.  We can discuss timing of Harvoni therapy with Dr. Megan Salon.     LOS: 4 days   Betsy Coder, DNP, AGPCNP-BC, AOCNP 07/03/19

## 2019-07-03 NOTE — Progress Notes (Signed)
Fentanyl patch discontinued/removed per MD.Pt has been very drowsy. Patch wasted in sharps, witnessed by [employer].

## 2019-07-03 NOTE — Progress Notes (Signed)
PROGRESS NOTE    Valerie [name]  BOF:[id number] DOB: Dec 27, 1965 DOA: 06/28/2019 PCP: Patient, No Pcp Per   Brief Narrative:  54 year old with history of hepatitis C, liver cancer 2004 treated with radiation came to the ER with complains of abdominal pain, nausea and diarrhea.  Upon admission diagnosed with metastatic liver cancer and hepatitis C.   Assessment & Plan:   Principal Problem:   SIRS (systemic inflammatory response syndrome) (HCC) Active Problems:   Thrombocytopenia (HCC)   Liver cirrhosis (HCC)   Elevated liver enzymes   Fever   Abdominal pain   Liver mass  Abdominal pain with liver mass, 8.8 cm Suspicious hepatocellular carcinoma, metastatic -Highly suspicious for metastatic hepatocellular carcinoma.  Liver biopsy results are pending, planned today. -Seen by oncology team, plans for pain control with outpatient follow-up. -Seen by general surgery-mass does not appear to be resectable therefore signed off.  Pain control/anxiety -Fentanyl is making her drowsy therefore I will discontinue it, as needed Robaxin and tramadol. -Prozac 20 mg daily  Chronic hepatitis C complicated by cirrhosis -Seen by infectious disease, recommending if patient has treatable [place] then they will start [place] before starting bevacizumab plus atezolizumab.  Anemia of chronic disease Chronic thrombocytopenia -Baseline hemoglobin 12.  Currently hemoglobin is 11.1, platelets are in 21.  No evidence of bleeding.  Essential hypertension -Lopressor IV 5 mg every 6 hours, hydralazine 5 mg every 6 hours as needed.  Today patient is n.p.o. we can start oral Norvasc once tolerating p.o.  Goals of care -Palliative care team consulted  Question of tumor thrombus in the right portal and hepatic venous system -Case discussed by hematology with radiology-no anticoagulation at this time.  Unfortunately patient has overall poor prognosis.  DVT prophylaxis: On hold due to liver biopsy Code Status:  DNR Family Communication: Daughter is at bedside Disposition Plan:   Patient From= home  Patient Anticipated D/C place= Home  Barriers= Home once his pain is well controlled, has had liver biopsy done tolerating some orals without any issues.  Hopefully home in next 24-48 hours  Subjective: Patient is to get liver biopsy today.  Still off-and-on having some abdominal discomfort and nausea.  Patient is quite drowsy this morning but daughter states she has been drowsy since the fentanyl patch was placed  Review of Systems Otherwise negative except as per HPI, including: General: Denies fever, chills, night sweats or unintended weight loss. Resp: Denies cough, wheezing, shortness of breath. Cardiac: Denies chest pain, palpitations, orthopnea, paroxysmal nocturnal dyspnea. GI: Denies abdominal pain, nausea, vomiting, diarrhea or constipation GU: Denies dysuria, frequency, hesitancy or incontinence MS: Denies muscle aches, joint pain or swelling Neuro: Denies headache, neurologic deficits (focal weakness, numbness, tingling), abnormal gait Psych: Denies anxiety, depression, SI/HI/AVH Skin: Denies new rashes or lesions ID: Denies sick contacts, exotic exposures, travel  Examination:  General exam: Appears calm and comfortable, cachectic frail Respiratory system: Clear to auscultation. Respiratory effort normal. Cardiovascular system: S1 & S2 heard, RRR. No JVD, murmurs, rubs, gallops or clicks. No pedal edema. Gastrointestinal system: Abdomen is nondistended, soft with some tenderness in right upper quadrant.. No organomegaly or masses felt. Normal bowel sounds heard. Central nervous system: Alert and oriented. No focal neurological deficits. Extremities: Symmetric 5 x 5 power. Skin: No rashes, lesions or ulcers Psychiatry: Judgement and insight appear normal. Mood & affect appropriate.     Objective: Vitals:   07/02/19 1940 07/02/19 2219 07/03/19 0036 07/03/19 0544  BP: (!) 187/111  140/85 140/84 (!) 156/101  Pulse: 81 98  90 80  Resp: 18   18  Temp: 99.3 F (37.4 C)   98.2 F (36.8 C)  TempSrc: Oral   Oral  SpO2: 98% 98%  98%  Weight:    56.7 kg  Height:        Intake/Output Summary (Last 24 hours) at 07/03/2019 0837 Last data filed at 07/02/2019 2200 Gross per 24 hour  Intake 330 ml  Output 400 ml  Net -70 ml   Filed Weights   07/01/19 0437 07/02/19 0359 07/03/19 0544  Weight: 57.9 kg 57.7 kg 56.7 kg     Data Reviewed:   CBC: Recent Labs  Lab 06/29/19 0418 06/30/19 0412 07/01/19 0500 07/02/19 0420 07/03/19 0438  WBC 6.0 7.6 6.1 4.8 5.7  NEUTROABS 3.3 4.8 3.2 2.3 3.0  HGB 10.6* 11.4* 10.9* 10.7* 11.1*  HCT 31.2* 33.9* 32.5* 31.8* 33.1*  MCV 94.3 93.1 93.1 93.3 93.2  PLT 97* 114* 127* 107* 409*   Basic Metabolic Panel: Recent Labs  Lab 06/29/19 0418 06/30/19 0412 07/01/19 0500 07/02/19 0420 07/03/19 0438  NA 137 135 135 134* 132*  K 3.2* 4.0 4.0 3.9 4.1  CL 106 103 105 105 101  CO2 21* 22 22 24 24   GLUCOSE 128* 96 101* 105* 107*  BUN 8 6 9 7  <5*  CREATININE 0.65 0.65 0.67 0.59 0.61  CALCIUM 8.2* 8.6* 8.3* 8.1* 8.5*  MG  --  1.7 1.8 2.0 1.8  PHOS  --  3.1 2.6 2.3* 3.3   GFR: Estimated Creatinine Clearance: 64.3 mL/min (by C-G formula based on SCr of 0.61 mg/dL). Liver Function Tests: Recent Labs  Lab 06/29/19 0418 06/30/19 0412 07/01/19 0500 07/02/19 0420 07/03/19 0438  AST 151* 149* 142* 148* 164*  ALT 53* 56* 52* 50* 56*  ALKPHOS 207* 193* 188* 198* 211*  BILITOT 1.1 1.9* 1.6* 1.6* 1.6*  PROT 6.1* 6.7 6.5 6.6 6.7  ALBUMIN 2.5* 2.6* 2.4* 2.4* 2.5*   Recent Labs  Lab 06/28/19 2123  LIPASE 43   No results for input(s): AMMONIA in the last 168 hours. Coagulation Profile: Recent Labs  Lab 06/28/19 2123 06/29/19 1343  INR 1.1 1.2   Cardiac Enzymes: No results for input(s): CKTOTAL, CKMB, CKMBINDEX, TROPONINI in the last 168 hours. BNP (last 3 results) No results for input(s): PROBNP in the last 8760  hours. HbA1C: No results for input(s): HGBA1C in the last 72 hours. CBG: No results for input(s): GLUCAP in the last 168 hours. Lipid Profile: No results for input(s): CHOL, HDL, LDLCALC, TRIG, CHOLHDL, LDLDIRECT in the last 72 hours. Thyroid Function Tests: No results for input(s): TSH, T4TOTAL, FREET4, T3FREE, THYROIDAB in the last 72 hours. Anemia Panel: No results for input(s): VITAMINB12, FOLATE, FERRITIN, TIBC, IRON, RETICCTPCT in the last 72 hours. Sepsis Labs: Recent Labs  Lab 06/28/19 2122 06/28/19 2249 06/29/19 0849 06/29/19 1032 06/30/19 0412 07/01/19 0500  PROCALCITON  --   --  0.37  --  0.37 0.42  LATICACIDVEN 1.2 0.7 1.1 0.8  --   --     Recent Results (from the past 240 hour(s))  Blood Culture (routine x 2)     Status: None   Collection Time: 06/28/19  9:28 PM   Specimen: BLOOD  Result Value Ref Range Status   Specimen Description BLOOD SITE NOT SPECIFIED  Final   Special Requests   Final    BOTTLES DRAWN AEROBIC AND ANAEROBIC Blood Culture results may not be optimal due to an excessive volume of blood received in culture  bottles   Culture   Final    NO GROWTH 5 DAYS Performed at [facility], [place] [address], [place], [place] [address]    Report Status 07/03/2019 FINAL  Final  Blood Culture (routine x 2)     Status: None   Collection Time: 06/28/19  9:28 PM   Specimen: BLOOD  Result Value Ref Range Status   Specimen Description BLOOD SITE NOT SPECIFIED  Final   Special Requests   Final    BOTTLES DRAWN AEROBIC AND ANAEROBIC Blood Culture results may not be optimal due to an excessive volume of blood received in culture bottles   Culture   Final    NO GROWTH 5 DAYS Performed at [facility], [place] [address] [place], [place] [address]    Report Status 07/03/2019 FINAL  Final  Urine culture     Status: Abnormal   Collection Time: 06/28/19  9:53 PM   Specimen: In/Out Cath Urine  Result Value Ref Range Status   Specimen Description IN/OUT  CATH URINE  Final   Special Requests   Final    NONE Performed at [facility], [place] [address], [place], [place] [address]    Culture (A)  Final    10,000 COLONIES/mL MULTIPLE SPECIES PRESENT, SUGGEST RECOLLECTION   Report Status 06/30/2019 FINAL  Final  Respiratory Panel by RT PCR (Flu A&B, Covid) - Nasopharyngeal Swab     Status: None   Collection Time: 06/29/19 12:18 AM   Specimen: Nasopharyngeal Swab  Result Value Ref Range Status   SARS Coronavirus 2 by RT PCR NEGATIVE NEGATIVE Final    Comment: (NOTE) SARS-CoV-2 target nucleic acids are NOT DETECTED. The SARS-CoV-2 RNA is generally detectable in upper respiratoy specimens during the acute phase of infection. The lowest concentration of SARS-CoV-2 viral copies this assay can detect is 131 copies/mL. A negative result does not preclude SARS-Cov-2 infection and should not be used as the sole basis for treatment or other patient management decisions. A negative result may occur with  improper specimen collection/handling, submission of specimen other than nasopharyngeal swab, presence of viral mutation(s) within the areas targeted by this assay, and inadequate number of viral copies (<131 copies/mL). A negative result must be combined with clinical observations, patient history, and epidemiological information. The expected result is Negative. Fact Sheet for Patients:  PinkCheek.be Fact Sheet for Healthcare Providers:  GravelBags.it This test is not yet ap proved or cleared by the [place] FDA and  has been authorized for detection and/or diagnosis of SARS-CoV-2 by FDA under an Emergency Use Authorization (EUA). This EUA will remain  in effect (meaning this test can be used) for the duration of the COVID-19 declaration under Section 564(b)(1) of the Act, 21 U.S.C. section 360bbb-3(b)(1), unless the authorization is terminated or revoked sooner.    Influenza A  by PCR NEGATIVE NEGATIVE Final   Influenza B by PCR NEGATIVE NEGATIVE Final    Comment: (NOTE) The Xpert Xpress SARS-CoV-2/FLU/RSV assay is intended as an aid in  the diagnosis of influenza from Nasopharyngeal swab specimens and  should not be used as a sole basis for treatment. Nasal washings and  aspirates are unacceptable for Xpert Xpress SARS-CoV-2/FLU/RSV  testing. Fact Sheet for Patients: PinkCheek.be Fact Sheet for Healthcare Providers: GravelBags.it This test is not yet approved or cleared by the [place] FDA and  has been authorized for detection and/or diagnosis of SARS-CoV-2 by  FDA under an Emergency Use Authorization (EUA). This EUA  will remain  in effect (meaning this test can be used) for the duration of the  Covid-19 declaration under Section 564(b)(1) of the Act, 21  U.S.C. section 360bbb-3(b)(1), unless the authorization is  terminated or revoked. Performed at [facility], [place] [address], [place], [place] [address]          Radiology Studies: NM Bone Scan Whole Body  Result Date: 07/02/2019 CLINICAL DATA:  Unknown primary malignancy. EXAM: NUCLEAR MEDICINE WHOLE BODY BONE SCAN TECHNIQUE: Whole body anterior and posterior images were obtained approximately 3 hours after intravenous injection of radiopharmaceutical. RADIOPHARMACEUTICALS:  21.9 mCi Technetium-48m MDP IV COMPARISON:  None. FINDINGS: Degenerative changes are seen involving both shoulders. No other areas of abnormal uptake are noted. IMPRESSION: No definite scintigraphic evidence of osseous metastases. Electronically Signed   By: Marijo Conception M.D.   On: 07/02/2019 14:56        Scheduled Meds: . feeding supplement (ENSURE ENLIVE)  237 mL Oral BID BM  . fentaNYL  1 patch Transdermal Q72H  . FLUoxetine  20 mg Oral Daily  . metoprolol tartrate  5 mg Intravenous Q6H   Continuous Infusions: . dextrose 5 % and 0.9% NaCl 75 mL/hr  at 07/03/19 0033     LOS: 4 days   Time spent= 25 mins    Dody Smartt Arsenio Loader, MD [facility]  If 7PM-7AM, please contact night-coverage  07/03/2019, 8:37 AM

## 2019-07-03 NOTE — Progress Notes (Signed)
Nutrition Follow-up  DOCUMENTATION CODES:   Not applicable  INTERVENTION:   -Once diet is advanced, add:   -30 ml Prostat TID, each supplement provides 100 kcals and 15 grams protein -MVI with minerals daily -Magic cup TID with meals, each supplement provides 290 kcal and 9 grams of protein  NUTRITION DIAGNOSIS:   Predicted suboptimal nutrient intake related to nausea as evidenced by per patient/family report.  Ongoing  GOAL:   Patient will meet greater than or equal to 90% of their needs  Progressing  MONITOR:   PO intake, Supplement acceptance, Weight trends, Labs  REASON FOR ASSESSMENT:   Malnutrition Screening Tool    ASSESSMENT:   Pt with a PMH significant for cirrhosis 2/2 HepC presented with a 3 week hx of abdominal pain, N/V, and diarrhea. Pt admitted for SIRS concerning for cholecystitis. CT revealed liver mass concerning for  Hepatocellular carcinoma and possible metastatic lesions in the lung.  Reviewed I/O's: -70 ml x 24 hours and +3.7 L since admission  UOP: 400 ml x 24 hours  Attempted to speak with pt x 2, however, out of room at time of visit (pt down in IR for liver biopsy).   Pt with erratic intake; noted meal completion 0-100%. Ensure supplements no longer ordered.   Reviewed wt hx; pt has experienced a 5.5% wt loss over the past month. Highly suspect pt with malnutrition, however, unable to identify without further history and completion of nutrition-focused physical exam.   Plan for goals of care meeting with palliative care today.   Medications reviewed and include dextrose 5%-0.9% sodium chloride infusion @ 75 ml/hr.   Labs reviewed: Na: 132.   Diet Order:   Diet Order            Diet NPO time specified Except for: Sips with Meds  Diet effective midnight              EDUCATION NEEDS:   No education needs have been identified at this time  Skin:  Skin Assessment: Reviewed RN Assessment  Last BM:  07/02/19  Height:   Ht  Readings from Last 1 Encounters:  06/28/19 5\' 2"  (1.575 m)    Weight:   Wt Readings from Last 1 Encounters:  07/03/19 56.7 kg    Ideal Body Weight:  50 kg  BMI:  Body mass index is 22.84 kg/m.  Estimated Nutritional Needs:   Kcal:  1400-1600  Protein:  70-85 grams  Fluid:  >/= 1.4L    Loistine Chance, RD, LDN, [place] Registered Dietitian II Certified Diabetes Care and Education Specialist Please refer to [facility] for RD and/or RD on-call/weekend/after hours pager

## 2019-07-03 NOTE — Procedures (Signed)
  Procedure: [place] core biopsy R liver mass   EBL:   minimal Complications:  none immediate  See full dictation in [facility].  Dillard Cannon MD Main # [phone] Pager  [phone]

## 2019-07-04 ENCOUNTER — Other Ambulatory Visit: Payer: Self-pay | Admitting: Oncology

## 2019-07-04 DIAGNOSIS — Z7189 Other specified counseling: Secondary | ICD-10-CM

## 2019-07-04 DIAGNOSIS — C220 Liver cell carcinoma: Secondary | ICD-10-CM

## 2019-07-04 LAB — CBC WITH DIFFERENTIAL/PLATELET
Abs Immature Granulocytes: 0.01 10*3/uL (ref 0.00–0.07)
Basophils Absolute: 0 10*3/uL (ref 0.0–0.1)
Basophils Relative: 1 %
Eosinophils Absolute: 0.2 10*3/uL (ref 0.0–0.5)
Eosinophils Relative: 4 %
HCT: 34.4 % — ABNORMAL LOW (ref 36.0–46.0)
Hemoglobin: 11.2 g/dL — ABNORMAL LOW (ref 12.0–15.0)
Immature Granulocytes: 0 %
Lymphocytes Relative: 25 %
Lymphs Abs: 1.3 10*3/uL (ref 0.7–4.0)
MCH: 31.1 pg (ref 26.0–34.0)
MCHC: 32.6 g/dL (ref 30.0–36.0)
MCV: 95.6 fL (ref 80.0–100.0)
Monocytes Absolute: 0.6 10*3/uL (ref 0.1–1.0)
Monocytes Relative: 11 %
Neutro Abs: 3.1 10*3/uL (ref 1.7–7.7)
Neutrophils Relative %: 59 %
Platelets: 97 10*3/uL — ABNORMAL LOW (ref 150–400)
RBC: 3.6 MIL/uL — ABNORMAL LOW (ref 3.87–5.11)
RDW: 16 % — ABNORMAL HIGH (ref 11.5–15.5)
WBC: 5.2 10*3/uL (ref 4.0–10.5)
nRBC: 0 % (ref 0.0–0.2)

## 2019-07-04 LAB — COMPREHENSIVE METABOLIC PANEL
ALT: 54 U/L — ABNORMAL HIGH (ref 0–44)
AST: 173 U/L — ABNORMAL HIGH (ref 15–41)
Albumin: 2.3 g/dL — ABNORMAL LOW (ref 3.5–5.0)
Alkaline Phosphatase: 206 U/L — ABNORMAL HIGH (ref 38–126)
Anion gap: 8 (ref 5–15)
BUN: 7 mg/dL (ref 6–20)
CO2: 24 mmol/L (ref 22–32)
Calcium: 8.4 mg/dL — ABNORMAL LOW (ref 8.9–10.3)
Chloride: 103 mmol/L (ref 98–111)
Creatinine, Ser: 0.57 mg/dL (ref 0.44–1.00)
GFR calc Af Amer: >60 mL/min (ref >60)
GFR calc non Af Amer: >60 mL/min (ref >60)
Glucose, Bld: 127 mg/dL — ABNORMAL HIGH (ref 70–99)
Potassium: 4.9 mmol/L (ref 3.5–5.1)
Sodium: 135 mmol/L (ref 135–145)
Total Bilirubin: 1.8 mg/dL — ABNORMAL HIGH (ref 0.3–1.2)
Total Protein: 6.3 g/dL — ABNORMAL LOW (ref 6.5–8.1)

## 2019-07-04 LAB — MAGNESIUM: Magnesium: 1.8 mg/dL (ref 1.7–2.4)

## 2019-07-04 LAB — HEPATITIS B SURFACE ANTIBODY, QUANTITATIVE: Hep B S AB Quant (Post): <3.1 m[IU]/mL — ABNORMAL LOW (ref >9.9)

## 2019-07-04 LAB — PHOSPHORUS: Phosphorus: 3.3 mg/dL (ref 2.5–4.6)

## 2019-07-04 MED ORDER — METOPROLOL TARTRATE 25 MG PO TABS: 25.0000 mg | ORAL_TABLET | Freq: Two times a day (BID) | ORAL | 1 refills | Status: DC

## 2019-07-04 MED ORDER — METOPROLOL TARTRATE 25 MG PO TABS
25.0000 mg | ORAL_TABLET | Freq: Two times a day (BID) | ORAL | Status: DC
  Administered 2019-07-04: 25 mg via ORAL
  Filled 2019-07-04: qty 1

## 2019-07-04 MED ORDER — ONDANSETRON HCL 4 MG PO TABS: 4.0000 mg | ORAL_TABLET | Freq: Three times a day (TID) | ORAL | 0 refills | Status: AC | PRN

## 2019-07-04 MED ORDER — FLUOXETINE HCL 20 MG PO CAPS: 20.0000 mg | ORAL_CAPSULE | Freq: Every day | ORAL | 1 refills | Status: DC

## 2019-07-04 MED ORDER — TRAMADOL HCL 50 MG PO TABS: 50.0000 mg | ORAL_TABLET | Freq: Four times a day (QID) | ORAL | 0 refills | Status: AC | PRN

## 2019-07-04 NOTE — Discharge Summary (Signed)
. Physician Discharge Summary  Valerie [name] IEP:[id number] DOB: 1966/03/11 DOA: 06/28/2019  PCP: Patient, No Pcp Per  Admit date: 06/28/2019 Discharge date: 07/04/2019  Admitted From: Home Disposition:  Discharged to home.   Recommendations for Outpatient Follow-up:  1. [facility] follow up scheduled for 07/22/19 2. Follow up with ID; call office for appt. 3. Follow up with Oncology as scheduled  Discharge Condition: Stable  CODE STATUS: DNR   Brief/Interim Summary: 54 year old with history of hepatitis C, liver cancer 2004 treated with radiation came to the ER with complains of abdominal pain, nausea and diarrhea.  Upon admission diagnosed with metastatic liver cancer and hepatitis C.  3/25: Denies complaints this AM. She is eager to go home. She will need follow up with ID, Oncology, and [facility].   Discharge Diagnoses:  Principal Problem:   SIRS (systemic inflammatory response syndrome) (HCC) Active Problems:   Thrombocytopenia (HCC)   Liver cirrhosis (HCC)   Elevated liver enzymes   Fever   Abdominal pain   Liver mass  Abdominal pain with liver mass, 8.8 cm Suspicious hepatocellular carcinoma, metastatic     - Highly suspicious for metastatic hepatocellular carcinoma.  Liver biopsy results are pending, planned today.     - Seen by oncology team, plans for pain control with outpatient follow-up.     - Seen by general surgery-mass does not appear to be resectable therefore signed off.  Pain control/anxiety     - Fentanyl is making her drowsy therefore I will discontinue it, as needed Robaxin and tramadol.     - Prozac 20 mg daily  Chronic hepatitis C complicated by cirrhosis     - Seen by infectious disease, recommending if patient has treatable [place] then they will start [place] before starting bevacizumab plus atezolizumab.  Anemia of chronic disease Chronic thrombocytopenia     - Baseline hemoglobin 12.  Currently hemoglobin is 11.1, platelets are  in 21.  No evidence of bleeding.  Essential hypertension     - Lopressor IV 5 mg every 6 hours, hydralazine 5 mg every 6 hours as needed.  Today patient is n.p.o.     -Start on metoprolol 25mg  BID; titrate outpt  Goals of care     - Palliative care team consulted     - per PC: [name] and her daughters express their goals are for patient to undergo all treatment options allowing her an opportunity to show some improvement/stability. Patient and daughters confirm DNR/DNI. Patient does not have a documented advanced directive. She reports her children will jointly make decisions.  Palliative Care services outpatient were explained and offered given patient and daughters expressed wishes for continued full scope and aggressive care. Patient and family verbalized their understanding and awareness of  Palliative's goals and philosophy of care. They are requesting outpatient support.   Question of tumor thrombus in the right portal and hepatic venous system     - Case discussed by hematology with radiology-no anticoagulation at this time.  She is stable for discharge today. I have updated dtr with plan Arturo Morton, [phone])  Discharge Instructions   Allergies as of 07/04/2019      Reactions   Norco [hydrocodone-acetaminophen] Anaphylaxis, Shortness Of Breath   Tylenol [acetaminophen] Other (See Comments)   Cannot take due to liver      Medication List    STOP taking these medications   diphenoxylate-atropine 2.5-0.025 MG tablet Commonly known as: Lomotil   ondansetron 4 MG disintegrating tablet Commonly known  as: Zofran ODT     TAKE these medications   FLUoxetine 20 MG capsule Commonly known as: PROZAC Take 1 capsule (20 mg total) by mouth daily. Start taking on: July 05, 2019   ibuprofen 600 MG tablet Commonly known as: ADVIL Take 1 tablet (600 mg total) by mouth every 6 (six) hours as needed.   methocarbamol 500 MG tablet Commonly known as: ROBAXIN Take 1  tablet (500 mg total) by mouth 2 (two) times daily.   metoprolol tartrate 25 MG tablet Commonly known as: LOPRESSOR Take 1 tablet (25 mg total) by mouth 2 (two) times daily.   ondansetron 4 MG tablet Commonly known as: ZOFRAN Take 1 tablet (4 mg total) by mouth every 8 (eight) hours as needed for up to 7 days for nausea.   traMADol 50 MG tablet Commonly known as: ULTRAM Take 1 tablet (50 mg total) by mouth every 6 (six) hours as needed for up to 7 days for severe pain.      Follow-up [place] Follow up on 07/22/2019.   Why: 10:00 am Contact information: [place] [id number] [phone]         Allergies  Allergen Reactions  . Norco [Hydrocodone-Acetaminophen] Anaphylaxis and Shortness Of Breath  . Tylenol [Acetaminophen] Other (See Comments)    Cannot take due to liver    Consultations:  ID  IR  Heme-onco  PC   Procedures/Studies: DG Chest 2 View  Result Date: 06/21/2019 CLINICAL DATA:  Chest pain EXAM: CHEST - 2 VIEW COMPARISON:  None. FINDINGS: Cardiac shadow is within normal limits. Mild right basilar atelectasis is noted. No effusion is noted. No bony abnormality is seen. Aortic calcifications are noted. IMPRESSION: Mild right basilar atelectasis. Electronically Signed   By: Inez Catalina M.D.   On: 06/21/2019 22:05   CT HEAD WO CONTRAST  Result Date: 06/29/2019 CLINICAL DATA:  Headache, intracranial hemorrhage suspected. Additional history provided: Diffuse headache with pain radiating into neck ongoing for days. EXAM: CT HEAD WITHOUT CONTRAST TECHNIQUE: Contiguous axial images were obtained from the base of the skull through the vertex without intravenous contrast. COMPARISON:  Head CT 07/03/2017, brain MRI 11/27/2016 FINDINGS: Brain: There is no evidence of acute intracranial hemorrhage, intracranial mass, midline shift or extra-axial fluid collection.No demarcated cortical  infarction. Cerebral volume is normal for age. Vascular: No hyperdense vessel.  Atherosclerotic calcifications Skull: Normal. Negative for fracture or focal lesion. Sinuses/Orbits: Visualized orbits demonstrate no acute abnormality. No significant paranasal sinus disease or mastoid effusion at the imaged levels. IMPRESSION: No evidence of acute intracranial abnormality. Specifically, no evidence of acute intracranial hemorrhage. Electronically Signed   By: Kellie Simmering DO   On: 06/29/2019 08:25   CT CHEST WO CONTRAST  Result Date: 06/30/2019 CLINICAL DATA:  54 year old female with history of cancer of unknown primary origin. EXAM: CT CHEST WITHOUT CONTRAST TECHNIQUE: Multidetector CT imaging of the chest was performed following the standard protocol without IV contrast. COMPARISON:  No prior chest CT. CT the abdomen and pelvis 06/28/2019. FINDINGS: Cardiovascular: Heart size is normal. There is no significant pericardial fluid, thickening or pericardial calcification. There is aortic atherosclerosis, as well as atherosclerosis of the great vessels of the mediastinum and the coronary arteries, including calcified atherosclerotic plaque in the left main and left anterior descending coronary arteries. Mediastinum/Nodes: Multiple prominent borderline enlarged mediastinal lymph nodes measuring up to 9 mm in short axis, nonspecific. Esophagus is unremarkable in appearance. No  axillary lymphadenopathy. Lungs/Pleura: Multiple pulmonary nodules are again noted throughout the lungs bilaterally, concerning for widespread metastatic disease. These nodules generally range from 2-8 mm in size. In addition, at the right lung base in the right lower lobe there is a mass-like area which appears related to direct invasion across the right hemidiaphragm from large liver mass, better demonstrated on prior CT the abdomen and pelvis 06/28/2019 (see that report for full details). Trace volume of right pleural fluid lying dependently.  No left pleural effusion. Upper Abdomen: Large mass in the right lobe of the liver poorly demonstrated on today's noncontrast CT examination. See report from contemporaneously obtained abdominal MRI 06/29/2019 for full description of findings beneath the diaphragm. Musculoskeletal: There are no aggressive appearing lytic or blastic lesions noted in the visualized portions of the skeleton. IMPRESSION: 1. Multiple pulmonary nodules highly concerning for widespread metastatic disease to the lungs. 2. Large right lobe of the liver mass with trans diaphragmatic invasion into the lower right hemithorax involving portions of the right lower lobe. 3. Aortic atherosclerosis, in addition to left main and left anterior descending coronary artery disease. Please note that although the presence of coronary artery calcium documents the presence of coronary artery disease, the severity of this disease and any potential stenosis cannot be assessed on this non-gated CT examination. Assessment for potential risk factor modification, dietary therapy or pharmacologic therapy may be warranted, if clinically indicated. Aortic Atherosclerosis (ICD10-I70.0). Electronically Signed   By: Vinnie Langton M.D.   On: 06/30/2019 12:48   MR ABDOMEN W WO CONTRAST  Result Date: 06/30/2019 CLINICAL DATA:  54 year old female with liver lesion noted on prior CT examination. Follow-up study. EXAM: MRI ABDOMEN WITHOUT AND WITH CONTRAST TECHNIQUE: Multiplanar multisequence MR imaging of the abdomen was performed both before and after the administration of intravenous contrast. CONTRAST:  5.28mL GADAVIST GADOBUTROL 1 MMOL/ML IV SOLN COMPARISON:  No prior abdominal MRI. CT the abdomen and pelvis 06/28/2019. FINDINGS: Comment: Portions of today's examination are limited by extensive patient respiratory motion. Lower chest: Trace right pleural effusion lying dependently. Numerous small areas of increased signal intensity corresponding to pulmonary  nodules noted throughout the visualize lung bases. Hepatobiliary: Liver has a slightly shrunken appearance and nodular contour, indicative of underlying cirrhosis. There is heterogeneous signal intensity throughout the liver, most evident in the right lobe of the liver where there are widespread somewhat ill-defined areas of mild T2 hyperintensity and T1 hypointensity. In the superior aspect of segment 7 of the liver there is a well-defined mass (axial image 9 of series 10 and coronal image 9 of series 4) measuring 4.4 x 4.4 x 4.1 cm, which demonstrates heterogeneous signal intensity on T1 and T2 weighted images, but generally has a peripheral rim of T2 hypointensity, and demonstrates heterogeneous internal enhancement on post gadolinium images without a specific enhancement pattern. Extending cephalad from this lesion there is additional irregular malignant appearing soft tissue which appears to directly invade the right hemidiaphragm best appreciated on coronal image 10 of series 4 and axial image 6 of series 5 where this is estimated to measure approximately 8.6 x 7.2 x 3.3 cm. This may also directly invade the inferior aspect of the right lower lobe, although this is difficult to say for certain on today's magnetic resonance examination. Notably, the ill-defined areas of T2 hyperintensity throughout the right lobe of the liver correspond to relatively diffuse arterial phase hyperenhancement on post gadolinium images which decreased on subsequent post gadolinium delayed imaging. Markedly heterogeneous perfusion is evident  throughout the posterior aspect of the right lobe of the liver, predominantly segments 7 and 6, suggesting widespread infiltrative poorly defined neoplasm in these regions (although some of this could be accounted for by the altered perfusion throughout this portion of the liver related to portal and hepatic venous obstruction). The right branches of the portal vein appear dilated and do not  demonstrate normal portal venous enhancement, with abrupt truncation of the right main portal vein best appreciated on axial image 42 of series 19), suggesting extensive tumor thrombus in the portal venous system of the right lobe of the liver. There is also incomplete opacification of the right hepatic vein (axial image 36 of series 19), suggesting additional tumor thrombus in the right hepatic vein. No intra or extrahepatic biliary ductal dilatation. Gallbladder is moderately distended with diffuse gallbladder wall edema, but no overt surrounding inflammatory changes. No filling defects in the gallbladder to suggest cholelithiasis. Pancreas: No pancreatic mass. No pancreatic ductal dilatation. No pancreatic or peripancreatic fluid collections or inflammatory changes. Spleen: Spleen appears borderline enlarged measuring 11.3 x 5.7 x 11.2 cm (estimated splenic volume of 361 mL) . Adrenals/Urinary Tract: Bilateral kidneys and adrenal glands are normal in appearance. No hydroureteronephrosis in the visualized portions of the abdomen. Stomach/Bowel: Visualized portions are unremarkable. Vascular/Lymphatic: No aneurysm identified in the visualized abdominal vasculature. Probable tumor thrombus in the right portal and hepatic venous systems, as detailed above. Numerous serpiginous densities adjacent to the proximal stomach and distal esophagus, compatible with varices. No definite lymphadenopathy confidently identified in the abdomen. Other: No significant volume of ascites noted in the visualized portions of the peritoneal cavity. Musculoskeletal: No aggressive appearing osseous lesions are noted in the visualized portions of the skeleton. IMPRESSION: 1. Cirrhosis with probable diffusely infiltrative hepatocellular carcinoma throughout the right lobe of the liver, including a more well encapsulated area in segment 7 near the dome. This appears to extend out of the hepatic capsule invading the right hemidiaphragm which  is enlarged, and potentially invading the lower right hemithorax with possible involvement of the inferior aspect of the right lower lobe. Trace right pleural effusion is likely malignant. 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 2. Multiple small pulmonary nodules scattered throughout the visualize lung bases compatible with widespread metastatic disease to the lungs, better demonstrated on chest CT 06/30/2019. 3. Moderate dilatation of the gallbladder with diffuse gallbladder wall edema. However, there are no gallstones and there is no pericholecystic fluid. This likely reflects gallbladder wall edema from intrinsic hepatic disease, and is not favored to indicate an acute cholecystitis at this time. 4. Borderline splenomegaly. 5. 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 Electronically Signed   By: Vinnie Langton M.D.   On: 06/30/2019 13:12   NM Bone Scan Whole Body  Result Date: 07/02/2019 CLINICAL DATA:  Unknown primary malignancy. EXAM: NUCLEAR MEDICINE WHOLE BODY BONE SCAN TECHNIQUE: Whole body anterior and posterior images were obtained approximately 3 hours after intravenous injection of radiopharmaceutical. RADIOPHARMACEUTICALS:  21.9 mCi Technetium-79m MDP IV COMPARISON:  None. FINDINGS: Degenerative changes are seen involving both shoulders. No other areas of abnormal uptake are noted. IMPRESSION: No definite scintigraphic evidence of osseous metastases. Electronically Signed   By: Marijo Conception M.D.   On: 07/02/2019 14:56   CT ABDOMEN PELVIS W CONTRAST  Result Date: 06/28/2019 CLINICAL DATA:  Right lower quadrant pain EXAM: CT ABDOMEN AND PELVIS WITH CONTRAST TECHNIQUE: Multidetector CT imaging of the abdomen and pelvis was performed using the standard  protocol following bolus administration of intravenous contrast. CONTRAST:  171mL OMNIPAQUE IOHEXOL 300 MG/ML  SOLN COMPARISON:  Ultrasound 06/28/2019 FINDINGS: Lower chest: Lung bases  demonstrate numerous pulmonary nodules measuring up to 8 mm in size concerning for metastatic disease. Heart size within normal limits. Hepatobiliary: Heterogenous liver. Subtle contour nodularity suggesting cirrhosis. No calcified gallstone. Small amount of pericholecystic fluid or wall thickening though normal appearance of the gallbladder by sonography. Large heterogenous mass that appears to be within the posterior right hepatic lobe. This measures approximately 8.2 x 6.9 by 8.8 cm and contains calcifications. The mass may invade the right diaphragm given its appearance on coronal views. Additional smaller mass measuring 2.9 cm, series 3, image number 28 more inferiorly within the right hepatic lobe. Suspect that there may be additional vague hypodense masses in the right lobe. Pancreas: Unremarkable. No pancreatic ductal dilatation or surrounding inflammatory changes. Spleen: Borderline enlarged. Adrenals/Urinary Tract: Left adrenal gland is normal. Questionable involvement of right adrenal gland by the hepatic mass lesion, series 3, image number 21. Kidneys show no hydronephrosis. The bladder is normal Stomach/Bowel: Stomach nonenlarged. No dilated small bowel. Diverticular disease of the colon. Possible mild wall thickening at the cecum and ascending colon. The appendix is negative. Vascular/Lymphatic: Moderate aortic atherosclerosis. No aneurysm. Small porta hepatis and gastrohepatic nodes measuring up to 16 mm in size, series 3, image number 30. Reproductive: Uterus and bilateral adnexa are unremarkable. Other: No free air. Small amount of ascites within the abdomen and pelvis. Diffuse hazy appearance of the mesentery. Musculoskeletal: Bones appear slightly dense but are without focal lytic or focal sclerotic process. IMPRESSION: 1. Probable liver cirrhosis. Large heterogeneous suspicion mass measuring at least 8.8 cm, probably arising from the right hepatic lobe of the liver but extending cephalad toward  the right lung base with possible violation of the capsule and invasion/involvement of the right diaphragm. At least 1 additional smaller solid mass in the inferior right hepatic lobe either representing metastatic disease or multifocal disease. Correlation with MRI should be considered. 2. Numerous pulmonary nodules consistent with metastatic disease 3. Possible gallbladder wall thickening or small amount of pericholecystic fluid as seen on recent ultrasound 4. Diffuse diverticular disease of the colon with possible wall thickening/mild acute inflammation at the cecum and ascending colon. 5. Small amount of free fluid in the pelvis. Electronically Signed   By: Donavan Foil M.D.   On: 06/28/2019 23:41   US BIOPSY (LIVER)  Result Date: 07/03/2019 CLINICAL DATA:  Right hepatic mass EXAM: ULTRASOUND-GUIDED CORE LIVER LESION BIOPSY TECHNIQUE: An ultrasound guided liver biopsy was thoroughly discussed with the patient and questions were answered. The benefits, risks, alternatives, and complications were also discussed. The patient understands and wishes to proceed with the procedure. A verbal as well as written consent was obtained. Survey ultrasound of the liver was performed, the lesion identified, and an appropriate skin entry site was determined. Skin site was marked, prepped with chlorhexidine, and draped in usual sterile fashion, and infiltrated locally with 1% lidocaine. Intravenous Fentanyl 122mcg and Versed 2mg  were administered as conscious sedation during continuous monitoring of the patient's level of consciousness and physiological / cardiorespiratory status by the radiology RN, with a total moderate sedation time of 11 minutes. A 17 gauge trocar needle was advanced under ultrasound guidance into the liver to the margin of the lesion. Multiple coaxial 18gauge core samples were then obtained through the guide needle. The guide needle was removed. Post procedure scans demonstrate no apparent complication.  COMPLICATIONS: COMPLICATIONS None  immediate FINDINGS: Echogenic posterior right hepatic lobe liver lesion was localized. Representative core biopsy samples obtained as above. IMPRESSION: 1. Technically successful ultrasound guided core liver lesion biopsy. Electronically Signed   By: Lucrezia Europe M.D.   On: 07/03/2019 11:18   DG Chest Port 1 View  Result Date: 06/28/2019 CLINICAL DATA:  Cough and shortness of breath. EXAM: PORTABLE CHEST 1 VIEW COMPARISON:  Aug 21, 2019 FINDINGS: The cardiac silhouette is enlarged. There is vascular congestion with developing pulmonary edema. There are small bilateral pleural effusions. Aortic calcifications are noted. Bibasilar atelectasis is noted. There is no acute osseous abnormality. IMPRESSION: Findings suggestive of congestive heart failure. Electronically Signed   By: Constance Holster M.D.   On: 06/28/2019 21:43   ECHOCARDIOGRAM COMPLETE  Result Date: 06/30/2019    ECHOCARDIOGRAM REPORT   Patient Name:   Valerie [name] Date of Exam: 06/30/2019 Medical Rec #:  [id number]    Height:       62.0 in Accession #:    [id number]   Weight:       125.6 lb Date of Birth:  March 11, 1966    BSA:          1.569 m Patient Age:    28 years     BP:           147/95 mmHg Patient Gender: F            HR:           71 bpm. Exam Location:  Inpatient Procedure: 2D Echo, Cardiac Doppler and Color Doppler Indications:    Pulmonary hypertension 416.8/I27.2  History:        Patient has no prior history of Echocardiogram examinations.                 Risk Factors:Current Smoker. Thrombocytopenia. Liver cirrhosis.                 SIRS. Hepatitis C. Marijuana abuse.  Sonographer:    Clayton Lefort RDCS (AE) Referring Phys: [id number] [place]  Sonographer Comments: Patient movement. Echocardiogram ended before protocol complete due to patient stating she could not tolerate laying on back or left side. Nurse notified. IMPRESSIONS  1. Limited images, incomplete study.  2. Left ventricular ejection  fraction, by estimation, is 60 to 65%. The left ventricle has normal function. The left ventricle has no regional wall motion abnormalities. Left ventricular diastolic function could not be evaluated.  3. Right ventricular systolic function is normal. The right ventricular size is normal. Tricuspid regurgitation signal is inadequate for assessing PA pressure.  4. The mitral valve is grossly normal. Trivial mitral valve regurgitation.  5. The aortic valve is tricuspid. Aortic valve regurgitation is not visualized. FINDINGS  Left Ventricle: Left ventricular ejection fraction, by estimation, is 60 to 65%. The left ventricle has normal function. The left ventricle has no regional wall motion abnormalities. The left ventricular internal cavity size was normal in size. There is  borderline left ventricular hypertrophy. Left ventricular diastolic function could not be evaluated. Right Ventricle: The right ventricular size is normal. No increase in right ventricular wall thickness. Right ventricular systolic function is normal. Tricuspid regurgitation signal is inadequate for assessing PA pressure. Left Atrium: Left atrial size was normal in size. Right Atrium: Right atrial size was normal in size. Pericardium: There is no evidence of pericardial effusion. Mitral Valve: The mitral valve is grossly normal. Trivial mitral valve regurgitation. Tricuspid Valve: The tricuspid valve is grossly normal. Tricuspid valve regurgitation  is mild. Aortic Valve: The aortic valve is tricuspid. Aortic valve regurgitation is not visualized. Mild aortic valve annular calcification. Pulmonic Valve: The pulmonic valve was grossly normal. Pulmonic valve regurgitation is mild. Aorta: The aortic root is normal in size and structure. Venous: The inferior vena cava was not well visualized. IAS/Shunts: No atrial level shunt detected by color flow Doppler.  LEFT VENTRICLE PLAX 2D LVIDd:         4.90 cm LVIDs:         3.10 cm LV PW:         1.10 cm LV  IVS:        0.90 cm LVOT diam:     1.80 cm LVOT Area:     2.54 cm  LEFT ATRIUM         Index LA diam:    3.60 cm 2.29 cm/m   AORTA Ao Root diam: 2.60 cm Ao Asc diam:  3.20 cm TRICUSPID VALVE TR Peak grad:   22.1 mmHg TR Vmax:        235.00 cm/s  SHUNTS Systemic Diam: 1.80 cm Rozann Lesches MD Electronically signed by Rozann Lesches MD Signature Date/Time: 06/30/2019/11:31:13 AM    Final    US Abdomen Limited RUQ  Result Date: 06/28/2019 CLINICAL DATA:  Chest, right upper quadrant pain for 3 weeks, nausea, vomiting and fevers, history of cirrhosis EXAM: ULTRASOUND ABDOMEN LIMITED RIGHT UPPER QUADRANT COMPARISON:  None. FINDINGS: Gallbladder: Mildly contracted appearance of the gallbladder. No visible calcified gallstones, significant gallbladder wall thickening. Trace pericholecystic fluid. Sonographic Percell Miller sign is reportedly negative. Common bile duct: Diameter: 1.3 mm proximally, nondilated. Distal portion not visualized. Liver: There is a heterogeneous, centrally hyperechoic, peripherally hypoechoic lesion with a thin capsule in the right lobe liver measuring 3.9 x 4.0 x 4.6 cm. Coarsened heterogeneous background parenchymal echogenicity with a nodular hepatic surface contour compatible with history of cirrhosis. Portal vein is patent on color Doppler imaging with normal direction of blood flow towards the liver. Other: None. IMPRESSION: Under distension of the gallbladder. Trace pericholecystic fluid is nonspecific with absence of other features of acute cholecystitis. Should be considered within the appropriate clinical setting. Heterogeneous 4.6 cm mass in the posterior right lobe liver, highly concerning for hepatocellular carcinoma given background of cirrhosis. Consider further characterization with multiphase MRI or CT, preferably on an outpatient basis when patient is better able to tolerate scanning procedure. Electronically Signed   By: Lovena Le M.D.   On: 06/28/2019 22:16       Subjective: "I'm doing ok. I just wanna go home."  Discharge Exam: Vitals:   07/04/19 0506 07/04/19 1126  BP: (!) 146/83 (!) 152/94  Pulse: 85 84  Resp: 18 17  Temp: 98.8 F (37.1 C) 98.7 F (37.1 C)  SpO2: 96% 100%   Vitals:   07/04/19 0023 07/04/19 0506 07/04/19 0518 07/04/19 1126  BP: (!) 166/106 (!) 146/83  (!) 152/94  Pulse: 91 85  84  Resp:  18  17  Temp:  98.8 F (37.1 C)  98.7 F (37.1 C)  TempSrc:  Oral  Oral  SpO2:  96%  100%  Weight:   56.9 kg   Height:        General: 54 y.o. female resting in bed in NAD Cardiovascular: RRR, +S1, S2, no m/g/r Respiratory: CTABL, no w/r/r, normal WOB GI: BS+, NDNT, no masses noted, no organomegaly noted MSK: No e/c/c Neuro: Alert to name; follows commands Psyc: Appropriate interaction and affect, calm/cooperative  The results of significant diagnostics from this hospitalization (including imaging, microbiology, ancillary and laboratory) are listed below for reference.     Microbiology: Recent Results (from the past 240 hour(s))  Blood Culture (routine x 2)     Status: None   Collection Time: 06/28/19  9:28 PM   Specimen: BLOOD  Result Value Ref Range Status   Specimen Description BLOOD SITE NOT SPECIFIED  Final   Special Requests   Final    BOTTLES DRAWN AEROBIC AND ANAEROBIC Blood Culture results may not be optimal due to an excessive volume of blood received in culture bottles   Culture   Final    NO GROWTH 5 DAYS Performed at [facility], [address], [place], [place] [address]    Report Status 07/03/2019 FINAL  Final  Blood Culture (routine x 2)     Status: None   Collection Time: 06/28/19  9:28 PM   Specimen: BLOOD  Result Value Ref Range Status   Specimen Description BLOOD SITE NOT SPECIFIED  Final   Special Requests   Final    BOTTLES DRAWN AEROBIC AND ANAEROBIC Blood Culture results may not be optimal due to an excessive volume of blood received in culture bottles   Culture    Final    NO GROWTH 5 DAYS Performed at [facility], [place] [address], [place], [place] [address]    Report Status 07/03/2019 FINAL  Final  Urine culture     Status: Abnormal   Collection Time: 06/28/19  9:53 PM   Specimen: In/Out Cath Urine  Result Value Ref Range Status   Specimen Description IN/OUT CATH URINE  Final   Special Requests   Final    NONE Performed at [facility], [place] [address] [place], [place] [address]    Culture (A)  Final    10,000 COLONIES/mL MULTIPLE SPECIES PRESENT, SUGGEST RECOLLECTION   Report Status 06/30/2019 FINAL  Final  Respiratory Panel by RT PCR (Flu A&B, Covid) - Nasopharyngeal Swab     Status: None   Collection Time: 06/29/19 12:18 AM   Specimen: Nasopharyngeal Swab  Result Value Ref Range Status   SARS Coronavirus 2 by RT PCR NEGATIVE NEGATIVE Final    Comment: (NOTE) SARS-CoV-2 target nucleic acids are NOT DETECTED. The SARS-CoV-2 RNA is generally detectable in upper respiratoy specimens during the acute phase of infection. The lowest concentration of SARS-CoV-2 viral copies this assay can detect is 131 copies/mL. A negative result does not preclude SARS-Cov-2 infection and should not be used as the sole basis for treatment or other patient management decisions. A negative result may occur with  improper specimen collection/handling, submission of specimen other than nasopharyngeal swab, presence of viral mutation(s) within the areas targeted by this assay, and inadequate number of viral copies (<131 copies/mL). A negative result must be combined with clinical observations, patient history, and epidemiological information. The expected result is Negative. Fact Sheet for Patients:  PinkCheek.be Fact Sheet for Healthcare Providers:  GravelBags.it This test is not yet ap proved or cleared by the [place] FDA and  has been authorized for detection and/or diagnosis of  SARS-CoV-2 by FDA under an Emergency Use Authorization (EUA). This EUA will remain  in effect (meaning this test can be used) for the duration of the COVID-19 declaration under Section 564(b)(1) of the Act, 21 U.S.C. section 360bbb-3(b)(1), unless the authorization is terminated or revoked sooner.    Influenza A by PCR NEGATIVE NEGATIVE Final  Influenza B by PCR NEGATIVE NEGATIVE Final    Comment: (NOTE) The Xpert Xpress SARS-CoV-2/FLU/RSV assay is intended as an aid in  the diagnosis of influenza from Nasopharyngeal swab specimens and  should not be used as a sole basis for treatment. Nasal washings and  aspirates are unacceptable for Xpert Xpress SARS-CoV-2/FLU/RSV  testing. Fact Sheet for Patients: PinkCheek.be Fact Sheet for Healthcare Providers: GravelBags.it This test is not yet approved or cleared by the [place] FDA and  has been authorized for detection and/or diagnosis of SARS-CoV-2 by  FDA under an Emergency Use Authorization (EUA). This EUA will remain  in effect (meaning this test can be used) for the duration of the  Covid-19 declaration under Section 564(b)(1) of the Act, 21  U.S.C. section 360bbb-3(b)(1), unless the authorization is  terminated or revoked. Performed at [facility], [place] [address], [place], [place] [address]      Labs: BNP (last 3 results) No results for input(s): BNP in the last 8760 hours. Basic Metabolic Panel: Recent Labs  Lab 06/30/19 0412 07/01/19 0500 07/02/19 0420 07/03/19 0438 07/04/19 0433  NA 135 135 134* 132* 135  K 4.0 4.0 3.9 4.1 4.9  CL 103 105 105 101 103  CO2 22 22 24 24 24   GLUCOSE 96 101* 105* 107* 127*  BUN 6 9 7  <5* 7  CREATININE 0.65 0.67 0.59 0.61 0.57  CALCIUM 8.6* 8.3* 8.1* 8.5* 8.4*  MG 1.7 1.8 2.0 1.8 1.8  PHOS 3.1 2.6 2.3* 3.3 3.3   Liver Function Tests: Recent Labs  Lab 06/30/19 0412 07/01/19 0500 07/02/19 0420 07/03/19 0438  07/04/19 0433  AST 149* 142* 148* 164* 173*  ALT 56* 52* 50* 56* 54*  ALKPHOS 193* 188* 198* 211* 206*  BILITOT 1.9* 1.6* 1.6* 1.6* 1.8*  PROT 6.7 6.5 6.6 6.7 6.3*  ALBUMIN 2.6* 2.4* 2.4* 2.5* 2.3*   Recent Labs  Lab 06/28/19 2123  LIPASE 43   No results for input(s): AMMONIA in the last 168 hours. CBC: Recent Labs  Lab 06/30/19 0412 07/01/19 0500 07/02/19 0420 07/03/19 0438 07/04/19 0433  WBC 7.6 6.1 4.8 5.7 5.2  NEUTROABS 4.8 3.2 2.3 3.0 3.1  HGB 11.4* 10.9* 10.7* 11.1* 11.2*  HCT 33.9* 32.5* 31.8* 33.1* 34.4*  MCV 93.1 93.1 93.3 93.2 95.6  PLT 114* 127* 107* 121* 97*   Cardiac Enzymes: No results for input(s): CKTOTAL, CKMB, CKMBINDEX, TROPONINI in the last 168 hours. BNP: Invalid input(s): POCBNP CBG: No results for input(s): GLUCAP in the last 168 hours. D-Dimer No results for input(s): DDIMER in the last 72 hours. Hgb A1c No results for input(s): HGBA1C in the last 72 hours. Lipid Profile No results for input(s): CHOL, HDL, LDLCALC, TRIG, CHOLHDL, LDLDIRECT in the last 72 hours. Thyroid function studies No results for input(s): TSH, T4TOTAL, T3FREE, THYROIDAB in the last 72 hours.  Invalid input(s): FREET3 Anemia work up No results for input(s): VITAMINB12, FOLATE, FERRITIN, TIBC, IRON, RETICCTPCT in the last 72 hours. Urinalysis    Component Value Date/Time   COLORURINE YELLOW 06/28/2019 2153   APPEARANCEUR CLEAR 06/28/2019 2153   LABSPEC 1.019 06/28/2019 2153   PHURINE 5.0 06/28/2019 2153   GLUCOSEU NEGATIVE 06/28/2019 2153   HGBUR MODERATE (A) 06/28/2019 2153   BILIRUBINUR NEGATIVE 06/28/2019 2153   [place] NEGATIVE 06/28/2019 2153   PROTEINUR NEGATIVE 06/28/2019 2153   NITRITE NEGATIVE 06/28/2019 2153   LEUKOCYTESUR TRACE (A) 06/28/2019 2153   Sepsis Labs Invalid input(s): PROCALCITONIN,  WBC,  [facility]  Recent Results (from the past 240 hour(s))  Blood Culture (routine x 2)     Status: None   Collection Time: 06/28/19   9:28 PM   Specimen: BLOOD  Result Value Ref Range Status   Specimen Description BLOOD SITE NOT SPECIFIED  Final   Special Requests   Final    BOTTLES DRAWN AEROBIC AND ANAEROBIC Blood Culture results may not be optimal due to an excessive volume of blood received in culture bottles   Culture   Final    NO GROWTH 5 DAYS Performed at [facility], [place] [address], [place], [place] [address]    Report Status 07/03/2019 FINAL  Final  Blood Culture (routine x 2)     Status: None   Collection Time: 06/28/19  9:28 PM   Specimen: BLOOD  Result Value Ref Range Status   Specimen Description BLOOD SITE NOT SPECIFIED  Final   Special Requests   Final    BOTTLES DRAWN AEROBIC AND ANAEROBIC Blood Culture results may not be optimal due to an excessive volume of blood received in culture bottles   Culture   Final    NO GROWTH 5 DAYS Performed at [facility], [place] [address], [place], [place] [address]    Report Status 07/03/2019 FINAL  Final  Urine culture     Status: Abnormal   Collection Time: 06/28/19  9:53 PM   Specimen: In/Out Cath Urine  Result Value Ref Range Status   Specimen Description IN/OUT CATH URINE  Final   Special Requests   Final    NONE Performed at [facility], [place] [address], [place], [place] [address]    Culture (A)  Final    10,000 COLONIES/mL MULTIPLE SPECIES PRESENT, SUGGEST RECOLLECTION   Report Status 06/30/2019 FINAL  Final  Respiratory Panel by RT PCR (Flu A&B, Covid) - Nasopharyngeal Swab     Status: None   Collection Time: 06/29/19 12:18 AM   Specimen: Nasopharyngeal Swab  Result Value Ref Range Status   SARS Coronavirus 2 by RT PCR NEGATIVE NEGATIVE Final    Comment: (NOTE) SARS-CoV-2 target nucleic acids are NOT DETECTED. The SARS-CoV-2 RNA is generally detectable in upper respiratoy specimens during the acute phase of infection. The lowest concentration of SARS-CoV-2 viral copies this assay can detect is 131 copies/mL. A negative result  does not preclude SARS-Cov-2 infection and should not be used as the sole basis for treatment or other patient management decisions. A negative result may occur with  improper specimen collection/handling, submission of specimen other than nasopharyngeal swab, presence of viral mutation(s) within the areas targeted by this assay, and inadequate number of viral copies (<131 copies/mL). A negative result must be combined with clinical observations, patient history, and epidemiological information. The expected result is Negative. Fact Sheet for Patients:  PinkCheek.be Fact Sheet for Healthcare Providers:  GravelBags.it This test is not yet ap proved or cleared by the [place] FDA and  has been authorized for detection and/or diagnosis of SARS-CoV-2 by FDA under an Emergency Use Authorization (EUA). This EUA will remain  in effect (meaning this test can be used) for the duration of the COVID-19 declaration under Section 564(b)(1) of the Act, 21 U.S.C. section 360bbb-3(b)(1), unless the authorization is terminated or revoked sooner.    Influenza A by PCR NEGATIVE NEGATIVE Final   Influenza B by PCR NEGATIVE NEGATIVE Final    Comment: (NOTE) The Xpert Xpress SARS-CoV-2/FLU/RSV assay is intended as an aid in  the diagnosis of influenza from Nasopharyngeal swab specimens and  should not be used as a sole basis for treatment. Nasal washings and  aspirates are unacceptable for Xpert Xpress SARS-CoV-2/FLU/RSV  testing. Fact Sheet for Patients: PinkCheek.be Fact Sheet for Healthcare Providers: GravelBags.it This test is not yet approved or cleared by the [place] FDA and  has been authorized for detection and/or diagnosis of SARS-CoV-2 by  FDA under an Emergency Use Authorization (EUA). This EUA will remain  in effect (meaning this test can be used) for the duration of  the  Covid-19 declaration under Section 564(b)(1) of the Act, 21  U.S.C. section 360bbb-3(b)(1), unless the authorization is  terminated or revoked. Performed at [facility], [place] [address] [place], [place] [address]      Time coordinating discharge: 35 minutes  SIGNED:   Jonnie Finner, DO  [facility] 07/04/2019, 11:47 AM   If 7PM-7AM, please contact night-coverage [email or web address]

## 2019-07-04 NOTE — Progress Notes (Signed)
START OFF PATHWAY REGIMEN - Other   [id number]:Atezolizumab 1,200 mg IV D1 + Bevacizumab 15 mg/kg IV D1 q21 Days:   A cycle is every 21 Days:     Atezolizumab      Bevacizumab-xxxx   **Always confirm dose/schedule in your pharmacy ordering system**  Patient Characteristics: Intent of Therapy: Non-Curative / Palliative Intent, Discussed with Patient 

## 2019-07-04 NOTE — Progress Notes (Addendum)
HEMATOLOGY-ONCOLOGY PROGRESS NOTE  SUBJECTIVE: Pain is well controlled with tramadol.  She has no other complaints today. She will be discharging to home later today as soon as her daughter gets here to pick her up.  PHYSICAL EXAMINATION:  Vitals:   07/04/19 0506 07/04/19 1126  BP: (!) 146/83 (!) 152/94  Pulse: 85 84  Resp: 18 17  Temp: 98.8 F (37.1 C) 98.7 F (37.1 C)  SpO2: 96% 100%   Filed Weights   07/02/19 0359 07/03/19 0544 07/04/19 0518  Weight: 57.7 kg 56.7 kg 56.9 kg    Intake/Output from previous day: 03/24 0701 - 03/25 0700 In: 0  Out: 850 [Urine:850]  GENERAL:alert ABDOMEN: No hepatomegaly, no mass   LABORATORY DATA:  I have reviewed the data as listed CMP Latest Ref Rng & Units 07/04/2019 07/03/2019 07/02/2019  Glucose 70 - 99 mg/dL 127(H) 107(H) 105(H)  BUN 6 - 20 mg/dL 7 <5(L) 7  Creatinine 0.44 - 1.00 mg/dL 0.57 0.61 0.59  Sodium 135 - 145 mmol/L 135 132(L) 134(L)  Potassium 3.5 - 5.1 mmol/L 4.9 4.1 3.9  Chloride 98 - 111 mmol/L 103 101 105  CO2 22 - 32 mmol/L 24 24 24   Calcium 8.9 - 10.3 mg/dL 8.4(L) 8.5(L) 8.1(L)  Total Protein 6.5 - 8.1 g/dL 6.3(L) 6.7 6.6  Total Bilirubin 0.3 - 1.2 mg/dL 1.8(H) 1.6(H) 1.6(H)  Alkaline Phos 38 - 126 U/L 206(H) 211(H) 198(H)  AST 15 - 41 U/L 173(H) 164(H) 148(H)  ALT 0 - 44 U/L 54(H) 56(H) 50(H)    Lab Results  Component Value Date   WBC 5.2 07/04/2019   HGB 11.2 (L) 07/04/2019   HCT 34.4 (L) 07/04/2019   MCV 95.6 07/04/2019   PLT 97 (L) 07/04/2019   NEUTROABS 3.1 07/04/2019    DG Chest 2 View  Result Date: 06/21/2019 CLINICAL DATA:  Chest pain EXAM: CHEST - 2 VIEW COMPARISON:  None. FINDINGS: Cardiac shadow is within normal limits. Mild right basilar atelectasis is noted. No effusion is noted. No bony abnormality is seen. Aortic calcifications are noted. IMPRESSION: Mild right basilar atelectasis. Electronically Signed   By: Inez Catalina M.D.   On: 06/21/2019 22:05   CT HEAD WO CONTRAST  Result Date:  06/29/2019 CLINICAL DATA:  Headache, intracranial hemorrhage suspected. Additional history provided: Diffuse headache with pain radiating into neck ongoing for days. EXAM: CT HEAD WITHOUT CONTRAST TECHNIQUE: Contiguous axial images were obtained from the base of the skull through the vertex without intravenous contrast. COMPARISON:  Head CT 07/03/2017, brain MRI 11/27/2016 FINDINGS: Brain: There is no evidence of acute intracranial hemorrhage, intracranial mass, midline shift or extra-axial fluid collection.No demarcated cortical infarction. Cerebral volume is normal for age. Vascular: No hyperdense vessel.  Atherosclerotic calcifications Skull: Normal. Negative for fracture or focal lesion. Sinuses/Orbits: Visualized orbits demonstrate no acute abnormality. No significant paranasal sinus disease or mastoid effusion at the imaged levels. IMPRESSION: No evidence of acute intracranial abnormality. Specifically, no evidence of acute intracranial hemorrhage. Electronically Signed   By: Kellie Simmering DO   On: 06/29/2019 08:25   CT CHEST WO CONTRAST  Result Date: 06/30/2019 CLINICAL DATA:  54 year old female with history of cancer of unknown primary origin. EXAM: CT CHEST WITHOUT CONTRAST TECHNIQUE: Multidetector CT imaging of the chest was performed following the standard protocol without IV contrast. COMPARISON:  No prior chest CT. CT the abdomen and pelvis 06/28/2019. FINDINGS: Cardiovascular: Heart size is normal. There is no significant pericardial fluid, thickening or pericardial calcification. There is aortic atherosclerosis,  as well as atherosclerosis of the great vessels of the mediastinum and the coronary arteries, including calcified atherosclerotic plaque in the left main and left anterior descending coronary arteries. Mediastinum/Nodes: Multiple prominent borderline enlarged mediastinal lymph nodes measuring up to 9 mm in short axis, nonspecific. Esophagus is unremarkable in appearance. No axillary  lymphadenopathy. Lungs/Pleura: Multiple pulmonary nodules are again noted throughout the lungs bilaterally, concerning for widespread metastatic disease. These nodules generally range from 2-8 mm in size. In addition, at the right lung base in the right lower lobe there is a mass-like area which appears related to direct invasion across the right hemidiaphragm from large liver mass, better demonstrated on prior CT the abdomen and pelvis 06/28/2019 (see that report for full details). Trace volume of right pleural fluid lying dependently. No left pleural effusion. Upper Abdomen: Large mass in the right lobe of the liver poorly demonstrated on today's noncontrast CT examination. See report from contemporaneously obtained abdominal MRI 06/29/2019 for full description of findings beneath the diaphragm. Musculoskeletal: There are no aggressive appearing lytic or blastic lesions noted in the visualized portions of the skeleton. IMPRESSION: 1. Multiple pulmonary nodules highly concerning for widespread metastatic disease to the lungs. 2. Large right lobe of the liver mass with trans diaphragmatic invasion into the lower right hemithorax involving portions of the right lower lobe. 3. Aortic atherosclerosis, in addition to left main and left anterior descending coronary artery disease. Please note that although the presence of coronary artery calcium documents the presence of coronary artery disease, the severity of this disease and any potential stenosis cannot be assessed on this non-gated CT examination. Assessment for potential risk factor modification, dietary therapy or pharmacologic therapy may be warranted, if clinically indicated. Aortic Atherosclerosis (ICD10-I70.0). Electronically Signed   By: Vinnie Langton M.D.   On: 06/30/2019 12:48   MR ABDOMEN W WO CONTRAST  Result Date: 06/30/2019 CLINICAL DATA:  54 year old female with liver lesion noted on prior CT examination. Follow-up study. EXAM: MRI ABDOMEN  WITHOUT AND WITH CONTRAST TECHNIQUE: Multiplanar multisequence MR imaging of the abdomen was performed both before and after the administration of intravenous contrast. CONTRAST:  5.67mL GADAVIST GADOBUTROL 1 MMOL/ML IV SOLN COMPARISON:  No prior abdominal MRI. CT the abdomen and pelvis 06/28/2019. FINDINGS: Comment: Portions of today's examination are limited by extensive patient respiratory motion. Lower chest: Trace right pleural effusion lying dependently. Numerous small areas of increased signal intensity corresponding to pulmonary nodules noted throughout the visualize lung bases. Hepatobiliary: Liver has a slightly shrunken appearance and nodular contour, indicative of underlying cirrhosis. There is heterogeneous signal intensity throughout the liver, most evident in the right lobe of the liver where there are widespread somewhat ill-defined areas of mild T2 hyperintensity and T1 hypointensity. In the superior aspect of segment 7 of the liver there is a well-defined mass (axial image 9 of series 10 and coronal image 9 of series 4) measuring 4.4 x 4.4 x 4.1 cm, which demonstrates heterogeneous signal intensity on T1 and T2 weighted images, but generally has a peripheral rim of T2 hypointensity, and demonstrates heterogeneous internal enhancement on post gadolinium images without a specific enhancement pattern. Extending cephalad from this lesion there is additional irregular malignant appearing soft tissue which appears to directly invade the right hemidiaphragm best appreciated on coronal image 10 of series 4 and axial image 6 of series 5 where this is estimated to measure approximately 8.6 x 7.2 x 3.3 cm. This may also directly invade the inferior aspect of the right lower  lobe, although this is difficult to say for certain on today's magnetic resonance examination. Notably, the ill-defined areas of T2 hyperintensity throughout the right lobe of the liver correspond to relatively diffuse arterial phase  hyperenhancement on post gadolinium images which decreased on subsequent post gadolinium delayed imaging. Markedly heterogeneous perfusion is evident throughout the posterior aspect of the right lobe of the liver, predominantly segments 7 and 6, suggesting widespread infiltrative poorly defined neoplasm in these regions (although some of this could be accounted for by the altered perfusion throughout this portion of the liver related to portal and hepatic venous obstruction). The right branches of the portal vein appear dilated and do not demonstrate normal portal venous enhancement, with abrupt truncation of the right main portal vein best appreciated on axial image 42 of series 19), suggesting extensive tumor thrombus in the portal venous system of the right lobe of the liver. There is also incomplete opacification of the right hepatic vein (axial image 36 of series 19), suggesting additional tumor thrombus in the right hepatic vein. No intra or extrahepatic biliary ductal dilatation. Gallbladder is moderately distended with diffuse gallbladder wall edema, but no overt surrounding inflammatory changes. No filling defects in the gallbladder to suggest cholelithiasis. Pancreas: No pancreatic mass. No pancreatic ductal dilatation. No pancreatic or peripancreatic fluid collections or inflammatory changes. Spleen: Spleen appears borderline enlarged measuring 11.3 x 5.7 x 11.2 cm (estimated splenic volume of 361 mL) . Adrenals/Urinary Tract: Bilateral kidneys and adrenal glands are normal in appearance. No hydroureteronephrosis in the visualized portions of the abdomen. Stomach/Bowel: Visualized portions are unremarkable. Vascular/Lymphatic: No aneurysm identified in the visualized abdominal vasculature. Probable tumor thrombus in the right portal and hepatic venous systems, as detailed above. Numerous serpiginous densities adjacent to the proximal stomach and distal esophagus, compatible with varices. No definite  lymphadenopathy confidently identified in the abdomen. Other: No significant volume of ascites noted in the visualized portions of the peritoneal cavity. Musculoskeletal: No aggressive appearing osseous lesions are noted in the visualized portions of the skeleton. IMPRESSION: 1. Cirrhosis with probable diffusely infiltrative hepatocellular carcinoma throughout the right lobe of the liver, including a more well encapsulated area in segment 7 near the dome. This appears to extend out of the hepatic capsule invading the right hemidiaphragm which is enlarged, and potentially invading the lower right hemithorax with possible involvement of the inferior aspect of the right lower lobe. Trace right pleural effusion is likely malignant. 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 2. Multiple small pulmonary nodules scattered throughout the visualize lung bases compatible with widespread metastatic disease to the lungs, better demonstrated on chest CT 06/30/2019. 3. Moderate dilatation of the gallbladder with diffuse gallbladder wall edema. However, there are no gallstones and there is no pericholecystic fluid. This likely reflects gallbladder wall edema from intrinsic hepatic disease, and is not favored to indicate an acute cholecystitis at this time. 4. Borderline splenomegaly. 5. 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 Electronically Signed   By: Vinnie Langton M.D.   On: 06/30/2019 13:12   NM Bone Scan Whole Body  Result Date: 07/02/2019 CLINICAL DATA:  Unknown primary malignancy. EXAM: NUCLEAR MEDICINE WHOLE BODY BONE SCAN TECHNIQUE: Whole body anterior and posterior images were obtained approximately 3 hours after intravenous injection of radiopharmaceutical. RADIOPHARMACEUTICALS:  21.9 mCi Technetium-78m MDP IV COMPARISON:  None. FINDINGS: Degenerative changes are seen involving both shoulders. No other areas of abnormal uptake are noted. IMPRESSION: No  definite scintigraphic evidence of osseous metastases. Electronically  Signed   By: Marijo Conception M.D.   On: 07/02/2019 14:56   CT ABDOMEN PELVIS W CONTRAST  Result Date: 06/28/2019 CLINICAL DATA:  Right lower quadrant pain EXAM: CT ABDOMEN AND PELVIS WITH CONTRAST TECHNIQUE: Multidetector CT imaging of the abdomen and pelvis was performed using the standard protocol following bolus administration of intravenous contrast. CONTRAST:  141mL OMNIPAQUE IOHEXOL 300 MG/ML  SOLN COMPARISON:  Ultrasound 06/28/2019 FINDINGS: Lower chest: Lung bases demonstrate numerous pulmonary nodules measuring up to 8 mm in size concerning for metastatic disease. Heart size within normal limits. Hepatobiliary: Heterogenous liver. Subtle contour nodularity suggesting cirrhosis. No calcified gallstone. Small amount of pericholecystic fluid or wall thickening though normal appearance of the gallbladder by sonography. Large heterogenous mass that appears to be within the posterior right hepatic lobe. This measures approximately 8.2 x 6.9 by 8.8 cm and contains calcifications. The mass may invade the right diaphragm given its appearance on coronal views. Additional smaller mass measuring 2.9 cm, series 3, image number 28 more inferiorly within the right hepatic lobe. Suspect that there may be additional vague hypodense masses in the right lobe. Pancreas: Unremarkable. No pancreatic ductal dilatation or surrounding inflammatory changes. Spleen: Borderline enlarged. Adrenals/Urinary Tract: Left adrenal gland is normal. Questionable involvement of right adrenal gland by the hepatic mass lesion, series 3, image number 21. Kidneys show no hydronephrosis. The bladder is normal Stomach/Bowel: Stomach nonenlarged. No dilated small bowel. Diverticular disease of the colon. Possible mild wall thickening at the cecum and ascending colon. The appendix is negative. Vascular/Lymphatic: Moderate aortic atherosclerosis. No aneurysm. Small porta hepatis  and gastrohepatic nodes measuring up to 16 mm in size, series 3, image number 30. Reproductive: Uterus and bilateral adnexa are unremarkable. Other: No free air. Small amount of ascites within the abdomen and pelvis. Diffuse hazy appearance of the mesentery. Musculoskeletal: Bones appear slightly dense but are without focal lytic or focal sclerotic process. IMPRESSION: 1. Probable liver cirrhosis. Large heterogeneous suspicion mass measuring at least 8.8 cm, probably arising from the right hepatic lobe of the liver but extending cephalad toward the right lung base with possible violation of the capsule and invasion/involvement of the right diaphragm. At least 1 additional smaller solid mass in the inferior right hepatic lobe either representing metastatic disease or multifocal disease. Correlation with MRI should be considered. 2. Numerous pulmonary nodules consistent with metastatic disease 3. Possible gallbladder wall thickening or small amount of pericholecystic fluid as seen on recent ultrasound 4. Diffuse diverticular disease of the colon with possible wall thickening/mild acute inflammation at the cecum and ascending colon. 5. Small amount of free fluid in the pelvis. Electronically Signed   By: Donavan Foil M.D.   On: 06/28/2019 23:41   US BIOPSY (LIVER)  Result Date: 07/03/2019 CLINICAL DATA:  Right hepatic mass EXAM: ULTRASOUND-GUIDED CORE LIVER LESION BIOPSY TECHNIQUE: An ultrasound guided liver biopsy was thoroughly discussed with the patient and questions were answered. The benefits, risks, alternatives, and complications were also discussed. The patient understands and wishes to proceed with the procedure. A verbal as well as written consent was obtained. Survey ultrasound of the liver was performed, the lesion identified, and an appropriate skin entry site was determined. Skin site was marked, prepped with chlorhexidine, and draped in usual sterile fashion, and infiltrated locally with 1%  lidocaine. Intravenous Fentanyl 135mcg and Versed 2mg  were administered as conscious sedation during continuous monitoring of the patient's level of consciousness and physiological / cardiorespiratory status by the radiology RN, with a total  moderate sedation time of 11 minutes. A 17 gauge trocar needle was advanced under ultrasound guidance into the liver to the margin of the lesion. Multiple coaxial 18gauge core samples were then obtained through the guide needle. The guide needle was removed. Post procedure scans demonstrate no apparent complication. COMPLICATIONS: COMPLICATIONS None immediate FINDINGS: Echogenic posterior right hepatic lobe liver lesion was localized. Representative core biopsy samples obtained as above. IMPRESSION: 1. Technically successful ultrasound guided core liver lesion biopsy. Electronically Signed   By: Lucrezia Europe M.D.   On: 07/03/2019 11:18   DG Chest Port 1 View  Result Date: 06/28/2019 CLINICAL DATA:  Cough and shortness of breath. EXAM: PORTABLE CHEST 1 VIEW COMPARISON:  Aug 21, 2019 FINDINGS: The cardiac silhouette is enlarged. There is vascular congestion with developing pulmonary edema. There are small bilateral pleural effusions. Aortic calcifications are noted. Bibasilar atelectasis is noted. There is no acute osseous abnormality. IMPRESSION: Findings suggestive of congestive heart failure. Electronically Signed   By: Constance Holster M.D.   On: 06/28/2019 21:43   ECHOCARDIOGRAM COMPLETE  Result Date: 06/30/2019    ECHOCARDIOGRAM REPORT   Patient Name:   FREDERIKA HUKILL Date of Exam: 06/30/2019 Medical Rec #:  [id number]    Height:       62.0 in Accession #:    [id number]   Weight:       125.6 lb Date of Birth:  November 20, 1965    BSA:          1.569 m Patient Age:    72 years     BP:           147/95 mmHg Patient Gender: F            HR:           71 bpm. Exam Location:  Inpatient Procedure: 2D Echo, Cardiac Doppler and Color Doppler Indications:    Pulmonary hypertension  416.8/I27.2  History:        Patient has no prior history of Echocardiogram examinations.                 Risk Factors:Current Smoker. Thrombocytopenia. Liver cirrhosis.                 SIRS. Hepatitis C. Marijuana abuse.  Sonographer:    Clayton Lefort RDCS (AE) Referring Phys: [id number] [place]  Sonographer Comments: Patient movement. Echocardiogram ended before protocol complete due to patient stating she could not tolerate laying on back or left side. Nurse notified. IMPRESSIONS  1. Limited images, incomplete study.  2. Left ventricular ejection fraction, by estimation, is 60 to 65%. The left ventricle has normal function. The left ventricle has no regional wall motion abnormalities. Left ventricular diastolic function could not be evaluated.  3. Right ventricular systolic function is normal. The right ventricular size is normal. Tricuspid regurgitation signal is inadequate for assessing PA pressure.  4. The mitral valve is grossly normal. Trivial mitral valve regurgitation.  5. The aortic valve is tricuspid. Aortic valve regurgitation is not visualized. FINDINGS  Left Ventricle: Left ventricular ejection fraction, by estimation, is 60 to 65%. The left ventricle has normal function. The left ventricle has no regional wall motion abnormalities. The left ventricular internal cavity size was normal in size. There is  borderline left ventricular hypertrophy. Left ventricular diastolic function could not be evaluated. Right Ventricle: The right ventricular size is normal. No increase in right ventricular wall thickness. Right ventricular systolic function is normal. Tricuspid regurgitation signal is inadequate for  assessing PA pressure. Left Atrium: Left atrial size was normal in size. Right Atrium: Right atrial size was normal in size. Pericardium: There is no evidence of pericardial effusion. Mitral Valve: The mitral valve is grossly normal. Trivial mitral valve regurgitation. Tricuspid Valve: The tricuspid valve  is grossly normal. Tricuspid valve regurgitation is mild. Aortic Valve: The aortic valve is tricuspid. Aortic valve regurgitation is not visualized. Mild aortic valve annular calcification. Pulmonic Valve: The pulmonic valve was grossly normal. Pulmonic valve regurgitation is mild. Aorta: The aortic root is normal in size and structure. Venous: The inferior vena cava was not well visualized. IAS/Shunts: No atrial level shunt detected by color flow Doppler.  LEFT VENTRICLE PLAX 2D LVIDd:         4.90 cm LVIDs:         3.10 cm LV PW:         1.10 cm LV IVS:        0.90 cm LVOT diam:     1.80 cm LVOT Area:     2.54 cm  LEFT ATRIUM         Index LA diam:    3.60 cm 2.29 cm/m   AORTA Ao Root diam: 2.60 cm Ao Asc diam:  3.20 cm TRICUSPID VALVE TR Peak grad:   22.1 mmHg TR Vmax:        235.00 cm/s  SHUNTS Systemic Diam: 1.80 cm Rozann Lesches MD Electronically signed by Rozann Lesches MD Signature Date/Time: 06/30/2019/11:31:13 AM    Final    US Abdomen Limited RUQ  Result Date: 06/28/2019 CLINICAL DATA:  Chest, right upper quadrant pain for 3 weeks, nausea, vomiting and fevers, history of cirrhosis EXAM: ULTRASOUND ABDOMEN LIMITED RIGHT UPPER QUADRANT COMPARISON:  None. FINDINGS: Gallbladder: Mildly contracted appearance of the gallbladder. No visible calcified gallstones, significant gallbladder wall thickening. Trace pericholecystic fluid. Sonographic Percell Miller sign is reportedly negative. Common bile duct: Diameter: 1.3 mm proximally, nondilated. Distal portion not visualized. Liver: There is a heterogeneous, centrally hyperechoic, peripherally hypoechoic lesion with a thin capsule in the right lobe liver measuring 3.9 x 4.0 x 4.6 cm. Coarsened heterogeneous background parenchymal echogenicity with a nodular hepatic surface contour compatible with history of cirrhosis. Portal vein is patent on color Doppler imaging with normal direction of blood flow towards the liver. Other: None. IMPRESSION: Under distension of  the gallbladder. Trace pericholecystic fluid is nonspecific with absence of other features of acute cholecystitis. Should be considered within the appropriate clinical setting. Heterogeneous 4.6 cm mass in the posterior right lobe liver, highly concerning for hepatocellular carcinoma given background of cirrhosis. Consider further characterization with multiphase MRI or CT, preferably on an outpatient basis when patient is better able to tolerate scanning procedure. Electronically Signed   By: Lovena Le M.D.   On: 06/28/2019 22:16    ASSESSMENT AND PLAN: 1.  Liver and lung lesions concerning for metastatic cancer, ?[place] primary  -CT 06/28/2019-probable cirrhosis, large heterogenous mass in the right liver extending to the right lung base with possible violation of the capsule and invasion of the right hemidiaphragm, at least 1 additional mass in the inferior right liver  -06/29/2019 MRI of the abdomen -probable diffusely infiltrative hepatocellular carcinoma throughout the right lobe of the liver which appears to extend out of the hepatic capsule and invade the right hemidiaphragm, tumor thrombus involving the right portal and hepatic veins, esophageal and proximal gastric varices -06/30/2019 CT of the chest without contrast -multiple pulmonary nodules concerning for widespread metastatic disease to the lungs,  large right lobe liver mass with transdiaphragmatic invasion into the lower right hemithorax involving portions of the right lower lobe -07/01/2019 -AFP pending 2.  Hepatitis C -Reports prior treatment with 16 weeks of interferon ribavirin -06/29/2019 HCV RNA quantitative 634,000 3.  Cirrhosis 4.  Pseudoseizures 5.  Bipolar disorder 6.  Anemia and thrombocytopenia 7.  Elevated LFTs  Valerie [name] appears improved.  Pain is well controlled with tramadol.  We are in the process of scheduling outpatient follow-up at the cancer center next week for her first cycle of chemotherapy.  Liver biopsy results  are still pending.  When she comes to the cancer center next week, she will have chemotherapy education class, visit, and we will plan to administer atezolizumab/bevacizumab.   We can discuss timing of Harvoni therapy with Dr. Megan Salon.    LOS: 5 days   Mikey Bussing, DNP, AGPCNP-BC, AOCNP 07/04/19 Valerie [name] appears stable.  She reports adequate pain relief with tramadol.  She tolerated the biopsy procedure well.  The pathology is pending.  She appears to have metastatic hepatocellular carcinoma.  I discussed the diagnosis and treatment options with Valerie [name].  Her daughter was present by telephone.  I recommend treatment with atezolizumab/Avastin.  We reviewed potential toxicities associated with Avastin including the chance for hypertension, bleeding, thromboembolic disease, bowel perforation, delayed wound healing, CNS toxicity, and nephrotoxicity.  We discussed the rash, diarrhea, hypothyroidism, and other autoimmune toxicities seen with PD1 inhibitors.  She agrees to proceed.  She will be scheduled for an office visit, chemotherapy teaching class, and cycle 1 systemic therapy on 07/11/2019 or 07/12/2019.  We will follow up on the AFP level and pathology from the liver biopsy.

## 2019-07-04 NOTE — Progress Notes (Signed)
Nutrition Follow-up  DOCUMENTATION CODES:   Non-severe (moderate) malnutrition in context of chronic illness  INTERVENTION:   -Continue Ensure Enlive po BID, each supplement provides 350 kcal and 20 grams of protein -Continue Magic cup TID with meals, each supplement provides 290 kcal and 9 grams of protein -Continue MVI with minerals daily -D/c Prostat  NUTRITION DIAGNOSIS:   Moderate Malnutrition related to chronic illness(metastatic hepatocellular carcinoma) as evidenced by percent weight loss, mild fat depletion, mild muscle depletion, moderate muscle depletion.  Ongoing  GOAL:   Patient will meet greater than or equal to 90% of their needs  Progressing   MONITOR:   PO intake, Supplement acceptance, Labs, Weight trends, Skin, I & O's  REASON FOR ASSESSMENT:   Malnutrition Screening Tool    ASSESSMENT:   Pt with a PMH significant for cirrhosis 2/2 HepC presented with a 3 week hx of abdominal pain, N/V, and diarrhea. Pt admitted for SIRS concerning for cholecystitis. CT revealed liver mass concerning for  Hepatocellular carcinoma and possible metastatic lesions in the lung.  3/24- s/p liver biopsy  Reviewed I/O's: -850 ml x 24 hours and +2.9 L since admission  UOP: 850 ml x 24 hours  Case discussed with RN prior to visit. She reports pt with erratic intake and does not prefer the hospital food, however, consumes food brought in by family members. Per RN, pt does not like Prostat.   Spoke with pt, who was tearful at time of visit. She is happy to go home, but is anxious regarding her diagnosis. Interview cut short, as pt requested to speak with son on her phone. She agreed to nutrition-focused physical exam. Noted multiple outside food items in the room, such as chips and crackers.   Per will likely discharge home today.  Labs reviewed.   NUTRITION - FOCUSED PHYSICAL EXAM:    Most Recent Value  Orbital Region  Mild depletion  Upper Arm Region  Mild depletion   Thoracic and Lumbar Region  No depletion  Buccal Region  No depletion  Temple Region  Mild depletion  Clavicle Bone Region  Mild depletion  Clavicle and Acromion Bone Region  Mild depletion  Scapular Bone Region  Mild depletion  Dorsal Hand  Mild depletion  Patellar Region  Moderate depletion  Anterior Thigh Region  Moderate depletion  Posterior Calf Region  Moderate depletion  Edema (RD Assessment)  None  Hair  Reviewed  Eyes  Reviewed  Mouth  Reviewed  Skin  Reviewed  Nails  Reviewed       Diet Order:   Diet Order            Diet Heart Room service appropriate? Yes; Fluid consistency: Thin  Diet effective now              EDUCATION NEEDS:   No education needs have been identified at this time  Skin:  Skin Assessment: Reviewed RN Assessment  Last BM:  07/02/19  Height:   Ht Readings from Last 1 Encounters:  06/28/19 5\' 2"  (1.575 m)    Weight:   Wt Readings from Last 1 Encounters:  07/04/19 56.9 kg    Ideal Body Weight:  50 kg  BMI:  Body mass index is 22.95 kg/m.  Estimated Nutritional Needs:   Kcal:  1400-1600  Protein:  70-85 grams  Fluid:  >/= 1.4L    Loistine Chance, RD, LDN, [place] Registered Dietitian II Certified Diabetes Care and Education Specialist Please refer to [facility] for RD and/or RD on-call/weekend/after  hours pager

## 2019-07-04 NOTE — Progress Notes (Signed)
[facility] for Infectious Disease  Date of Admission:  06/28/2019       ASSESSMENT: Valerie [name] is a 54 y.o. female with chronic hepatitis C s/p failed treatment with 16 weeks interferon + riba > 15 years ago. She underwent liver biopsy today of a large mass that has progressed outside the liver capsule and invaded the thorax. Pathology report is still pending. Dr. Benay Spice and his team are planning to start atezolizumab/bevacizumab in the next week or so.   Discussion with Valerie [name] and her daughter today regarding the treatment of hepatitis C infection - genotype is still in process. In review of literature and further extensive discussions with ID team, the risk of starting treatment for her at this time outweighs potential benefit given overall prognosis. Bone scan without definite evidence of metastasis. We discussed re-evaluation after 6-12 months depending on how she responds to chemotherapy which she is open to.   She is understandably depressed and overwhelmed - appreciate greatly the care and coordination of palliative care medicine and oncology for ongoing support. Emotional support offered during encounter.   She has (+) hepatitis B core antibody but (-) surface antibody - unclear that there would be much benefit here to vaccinate her given impending treatment as it would likely impact ability for the immunization to be effective.     PLAN: 1. Per oncology plans and recommendations 2. Referral for treatment to be determined based on patient's progress and response to treatment  Will sign off at this time.    Principal Problem:   SIRS (systemic inflammatory response syndrome) (HCC) Active Problems:   Thrombocytopenia (HCC)   Liver cirrhosis (HCC)   Elevated liver enzymes   Fever   Abdominal pain   Liver mass   . feeding supplement (ENSURE ENLIVE)  237 mL Oral BID BM  . FLUoxetine  20 mg Oral Daily  . metoprolol tartrate  25 mg Oral BID  .  multivitamin with minerals  1 tablet Oral Daily    SUBJECTIVE: Packed up and ready to leave for home. She is tearful and very worried about not being able to work during this process. 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 She prior to this hospitalization had to frequently stop and take breaks due to exhaustion and pain.  She is fearful over losing her home and being a burden on her daughter who already has 6 children of her own.    Review of Systems  Constitutional: Negative for chills and fever.  HENT: Negative for tinnitus.   Eyes: Negative for blurred vision and photophobia.  Respiratory: Negative for cough and sputum production.   Cardiovascular: Negative for chest pain.  Gastrointestinal: Negative for diarrhea, nausea and vomiting.  Genitourinary: Negative for dysuria.  Skin: Negative for rash.  Neurological: Negative for headaches.    Allergies  Allergen Reactions  . Norco [Hydrocodone-Acetaminophen] Anaphylaxis and Shortness Of Breath  . Tylenol [Acetaminophen] Other (See Comments)    Cannot take due to liver    OBJECTIVE: Vitals:   07/04/19 0023 07/04/19 0506 07/04/19 0518 07/04/19 1126  BP: (!) 166/106 (!) 146/83  (!) 152/94  Pulse: 91 85  84  Resp:  18  17  Temp:  98.8 F (37.1 C)  98.7 F (37.1 C)  TempSrc:  Oral  Oral  SpO2:  96%  100%  Weight:   56.9 kg   Height:  Body mass index is 22.95 kg/m.  Physical Exam Constitutional:      Comments: Sitting up in bed.   Cardiovascular:     Rate and Rhythm: Normal rate and regular rhythm.  Pulmonary:     Effort: Pulmonary effort is normal.  Neurological:     Mental Status: She is alert.  Psychiatric:     Comments: Tearful at times      Lab Results Lab Results  Component Value Date   WBC 5.2 07/04/2019   HGB 11.2 (L) 07/04/2019   HCT 34.4 (L) 07/04/2019   MCV 95.6 07/04/2019   PLT 97 (L) 07/04/2019    Lab Results  Component Value Date   CREATININE 0.57  07/04/2019   BUN 7 07/04/2019   NA 135 07/04/2019   K 4.9 07/04/2019   CL 103 07/04/2019   CO2 24 07/04/2019    Lab Results  Component Value Date   ALT 54 (H) 07/04/2019   AST 173 (H) 07/04/2019   ALKPHOS 206 (H) 07/04/2019   BILITOT 1.8 (H) 07/04/2019     Microbiology: Recent Results (from the past 240 hour(s))  Blood Culture (routine x 2)     Status: None   Collection Time: 06/28/19  9:28 PM   Specimen: BLOOD  Result Value Ref Range Status   Specimen Description BLOOD SITE NOT SPECIFIED  Final   Special Requests   Final    BOTTLES DRAWN AEROBIC AND ANAEROBIC Blood Culture results may not be optimal due to an excessive volume of blood received in culture bottles   Culture   Final    NO GROWTH 5 DAYS Performed at [facility], [place] [address] [place], [place] [address]    Report Status 07/03/2019 FINAL  Final  Blood Culture (routine x 2)     Status: None   Collection Time: 06/28/19  9:28 PM   Specimen: BLOOD  Result Value Ref Range Status   Specimen Description BLOOD SITE NOT SPECIFIED  Final   Special Requests   Final    BOTTLES DRAWN AEROBIC AND ANAEROBIC Blood Culture results may not be optimal due to an excessive volume of blood received in culture bottles   Culture   Final    NO GROWTH 5 DAYS Performed at [facility], [place] [address] [place], [place] [address]    Report Status 07/03/2019 FINAL  Final  Urine culture     Status: Abnormal   Collection Time: 06/28/19  9:53 PM   Specimen: In/Out Cath Urine  Result Value Ref Range Status   Specimen Description IN/OUT CATH URINE  Final   Special Requests   Final    NONE Performed at [facility], [place] [address] [place], [place] [address]    Culture (A)  Final    10,000 COLONIES/mL MULTIPLE SPECIES PRESENT, SUGGEST RECOLLECTION   Report Status 06/30/2019 FINAL  Final  Respiratory Panel by RT PCR (Flu A&B, Covid) - Nasopharyngeal Swab     Status: None   Collection Time: 06/29/19 12:18 AM    Specimen: Nasopharyngeal Swab  Result Value Ref Range Status   SARS Coronavirus 2 by RT PCR NEGATIVE NEGATIVE Final    Comment: (NOTE) SARS-CoV-2 target nucleic acids are NOT DETECTED. The SARS-CoV-2 RNA is generally detectable in upper respiratoy specimens during the acute phase of infection. The lowest concentration of SARS-CoV-2 viral copies this assay can detect is 131 copies/mL. A negative result does not preclude SARS-Cov-2 infection and should not be used as  the sole basis for treatment or other patient management decisions. A negative result may occur with  improper specimen collection/handling, submission of specimen other than nasopharyngeal swab, presence of viral mutation(s) within the areas targeted by this assay, and inadequate number of viral copies (<131 copies/mL). A negative result must be combined with clinical observations, patient history, and epidemiological information. The expected result is Negative. Fact Sheet for Patients:  PinkCheek.be Fact Sheet for Healthcare Providers:  GravelBags.it This test is not yet ap proved or cleared by the [place] FDA and  has been authorized for detection and/or diagnosis of SARS-CoV-2 by FDA under an Emergency Use Authorization (EUA). This EUA will remain  in effect (meaning this test can be used) for the duration of the COVID-19 declaration under Section 564(b)(1) of the Act, 21 U.S.C. section 360bbb-3(b)(1), unless the authorization is terminated or revoked sooner.    Influenza A by PCR NEGATIVE NEGATIVE Final   Influenza B by PCR NEGATIVE NEGATIVE Final    Comment: (NOTE) The Xpert Xpress SARS-CoV-2/FLU/RSV assay is intended as an aid in  the diagnosis of influenza from Nasopharyngeal swab specimens and  should not be used as a sole basis for treatment. Nasal washings and  aspirates are unacceptable for Xpert Xpress SARS-CoV-2/FLU/RSV  testing. Fact Sheet  for Patients: PinkCheek.be Fact Sheet for Healthcare Providers: GravelBags.it This test is not yet approved or cleared by the [place] FDA and  has been authorized for detection and/or diagnosis of SARS-CoV-2 by  FDA under an Emergency Use Authorization (EUA). This EUA will remain  in effect (meaning this test can be used) for the duration of the  Covid-19 declaration under Section 564(b)(1) of the Act, 21  U.S.C. section 360bbb-3(b)(1), unless the authorization is  terminated or revoked. Performed at [facility], [place] [address] [place], [place] [address]     Janene Madeira, MSN, NP-C [facility] for Infectious Disease [email or web address] Pager: [phone] Office: [phone] Chance: [phone]

## 2019-07-05 ENCOUNTER — Telehealth: Payer: Self-pay | Admitting: Oncology

## 2019-07-05 LAB — AFP TUMOR MARKER

## 2019-07-05 NOTE — Telephone Encounter (Signed)
Scheduled appts per sch msg. Called and spoke with patient. Confirmed appts for 4/1 and 4/2

## 2019-07-06 LAB — HEPATITIS C GENOTYPE

## 2019-07-07 ENCOUNTER — Other Ambulatory Visit: Payer: Self-pay | Admitting: Oncology

## 2019-07-08 NOTE — Progress Notes (Signed)
Pharmacist Chemotherapy Monitoring - Initial Assessment    Anticipated start date: 07/12/2019   Regimen:  . Are orders appropriate based on the patient's diagnosis, regimen, and cycle? Yes . Does the plan date match the patient's scheduled date? Yes . Is the sequencing of drugs appropriate? Yes . Are the premedications appropriate for the patient's regimen? N/A . Prior Authorization for treatment is: Not Started o If applicable, is the correct biosimilar selected based on the patient's insurance? yes  Organ Function and Labs: Marland Kitchen Are dose adjustments needed based on the patient's renal function, hepatic function, or hematologic function? No . Are appropriate labs ordered prior to the start of patient's treatment? Yes . Other organ system assessment, if indicated: bevacizumab: baseline BP . The following baseline labs, if indicated, have been ordered: atezolizumab: baseline TSH +/- T4 and bevacizumab: urine protein  Dose Assessment: . Are the drug doses appropriate? Yes . Are the following correct: o Drug concentrations Yes o IV fluid compatible with drug Yes o Administration routes Yes o Timing of therapy Yes . If applicable, does the patient have documented access for treatment and/or plans for port-a-cath placement? yes . If applicable, have lifetime cumulative doses been properly documented and assessed? not applicable  Toxicity Monitoring/Prevention: . The patient has the following take home antiemetics prescribed: Ondansetron . The patient has the following take home medications prescribed: N/A . Medication allergies and previous infusion related reactions, if applicable, have been reviewed and addressed. Yes . The patient's current medication list has been assessed for drug-drug interactions with their chemotherapy regimen. no significant drug-drug interactions were identified on review.  Order Review: . Are the treatment plan orders signed? Yes . Is the patient scheduled to see  a provider prior to their treatment? Yes  I verify that I have reviewed each item in the above checklist and answered each question accordingly.  Britt Boozer 07/08/2019 8:16 AM

## 2019-07-09 ENCOUNTER — Other Ambulatory Visit: Payer: Self-pay | Admitting: Oncology

## 2019-07-09 ENCOUNTER — Telehealth: Payer: Self-pay | Admitting: *Deleted

## 2019-07-09 MED ORDER — HYDROMORPHONE HCL 2 MG PO TABS: 2.0000 mg | ORAL_TABLET | ORAL | 0 refills | Status: DC | PRN
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
## 2019-07-09 NOTE — Telephone Encounter (Signed)
Notified patient that MD sent in script for hydromorphone for her.

## 2019-07-09 NOTE — Progress Notes (Signed)
07/09/19  Changing patient to [facility] for drug replacement due to no insurance.  Application will be signed and submitted on 07/11/19 when patient arrives for chemo education.  Status of approval will be noted in [place].  In-Basket sent to Dr Benay Spice notifying him of change.  Henreitta Leber, PharmD

## 2019-07-10 ENCOUNTER — Telehealth: Payer: Self-pay | Admitting: *Deleted

## 2019-07-10 NOTE — Telephone Encounter (Signed)
Called patient to follow up on her pain since med change to hydromorphone. She reports excellent pain relief with no adverse effect except excess sedation. Suggested she take 1/2 tablet next time and she agrees to try this.

## 2019-07-11 ENCOUNTER — Encounter (INDEPENDENT_AMBULATORY_CARE_PROVIDER_SITE_OTHER): Payer: Self-pay

## 2019-07-11 ENCOUNTER — Other Ambulatory Visit: Payer: Self-pay

## 2019-07-11 ENCOUNTER — Inpatient Hospital Stay (HOSPITAL_BASED_OUTPATIENT_CLINIC_OR_DEPARTMENT_OTHER): Payer: Medicaid Other | Admitting: Medical

## 2019-07-11 ENCOUNTER — Inpatient Hospital Stay: Payer: Medicaid Other | Attending: Oncology

## 2019-07-11 VITALS — BP 159/97 | HR 75 | Temp 98.5°F

## 2019-07-11 DIAGNOSIS — F319 Bipolar disorder, unspecified: Secondary | ICD-10-CM | POA: Insufficient documentation

## 2019-07-11 DIAGNOSIS — C220 Liver cell carcinoma: Secondary | ICD-10-CM

## 2019-07-11 DIAGNOSIS — R300 Dysuria: Secondary | ICD-10-CM | POA: Insufficient documentation

## 2019-07-11 DIAGNOSIS — D6959 Other secondary thrombocytopenia: Secondary | ICD-10-CM | POA: Diagnosis not present

## 2019-07-11 DIAGNOSIS — Z5112 Encounter for antineoplastic immunotherapy: Secondary | ICD-10-CM | POA: Diagnosis present

## 2019-07-11 DIAGNOSIS — F17210 Nicotine dependence, cigarettes, uncomplicated: Secondary | ICD-10-CM | POA: Diagnosis not present

## 2019-07-11 DIAGNOSIS — R0789 Other chest pain: Secondary | ICD-10-CM

## 2019-07-11 DIAGNOSIS — D649 Anemia, unspecified: Secondary | ICD-10-CM | POA: Insufficient documentation

## 2019-07-11 DIAGNOSIS — B1920 Unspecified viral hepatitis C without hepatic coma: Secondary | ICD-10-CM | POA: Diagnosis not present

## 2019-07-11 DIAGNOSIS — Z79899 Other long term (current) drug therapy: Secondary | ICD-10-CM | POA: Insufficient documentation

## 2019-07-11 DIAGNOSIS — R7989 Other specified abnormal findings of blood chemistry: Secondary | ICD-10-CM | POA: Insufficient documentation

## 2019-07-11 LAB — SURGICAL PATHOLOGY

## 2019-07-11 MED ORDER — GABAPENTIN 300 MG PO CAPS: 300.0000 mg | ORAL_CAPSULE | Freq: Three times a day (TID) | ORAL | 5 refills | Status: DC

## 2019-07-11 NOTE — Progress Notes (Signed)
Spoke with patient as introductory call, explained my role as GI nurse navigator, reminded her of her appointment tomorrow 4/2 at 10:30.  I asked her to arrive at least 15 minutes early to register.  She states she has a knot under her left breast that wasn't there previously.  I explained we will evaluate this at her visit.  The patient verbalized an understanding of the above and that I will meet her tomorrow as well.

## 2019-07-11 NOTE — Patient Instructions (Signed)

## 2019-07-11 NOTE — Progress Notes (Signed)
[facility] OFFICE PROGRESS NOTE  Patient, No Pcp Per No address on file  DIAGNOSIS: Hepatocellular Carcinoma. Diagnosed in March 2021  CURRENT THERAPY: Avastin and Tecentriq IV every 3 weeks. First dose expected 07/12/2019  INTERVAL HISTORY: Valerie [name] 54 y.o. female returns to the clinic for a follow up visit accompanied by her daughter. The patient was recently diagnosed with hepatocellular carcinoma. She presented to the ER on 3/19. Further workup discovered/confirmed the diagnosis of hepatocellular carcinoma. Imaging demonstrated diffusely infiltrative hepatocellular carcinoma throughout the right lobe of the liver. The scan also noted that it appears to extend out of the hepatic capsule invading the right hemidiaphragm which is enlarged, and potentially invading the lower right hemithorax with possible involvement of the inferior aspect of the right lower lobe. Multiple small pulmonary nodules scattered throughout the visualize lung bases. Tumor thrombus involving the right portal and hepatic venous systems. Due to her varices, it was not recommended to pursue anti-coagulation. AFP noted to be >108,000. She is being seen by infectious disease for her hepatitis C.   She was having significant referred pain due to her liver/diaphragm involvement. She was recently prescribed 2 mg of dilaudid with fairly good control of her pain. If her pain is severe, she will take dilaudid. If her pain is mild, she will take tramadol. She took tramadol today and denies significant pain. She denies any recent fevers, chills, or night sweats. She reportedly lost about 40 lbs since December. Her weight has stabilized now. Denies cough or shortness of breath. Reports nausea without vomiting. Denies diarrhea or constipation. Her appetite is good today. She was having significant burning pain in her lateral left rib cage yesterday and was prescribed gabapentin. Denies pain in that area today.   She is  here today for evaluation before starting cycle #1 with avastin and tecentriq.    MEDICAL HISTORY: Past Medical History:  Diagnosis Date  . Bipolar disorder ([place])   . Hepatitis C   . Liver cirrhosis ([place])   . Pseudoseizures     ALLERGIES:  is allergic to norco [hydrocodone-acetaminophen] and tylenol [acetaminophen].  MEDICATIONS:  Current Outpatient Medications  Medication Sig Dispense Refill  . FLUoxetine (PROZAC) 20 MG capsule Take 1 capsule (20 mg total) by mouth daily. 30 capsule 1  . gabapentin (NEURONTIN) 300 MG capsule Take 1 capsule (300 mg total) by mouth 3 (three) times daily. 90 capsule 5  . HYDROmorphone (DILAUDID) 2 MG tablet Take 1 tablet (2 mg total) by mouth every 4 (four) hours as needed for severe pain. 60 tablet 0  . ibuprofen (ADVIL) 600 MG tablet Take 1 tablet (600 mg total) by mouth every 6 (six) hours as needed. 30 tablet 0  . methocarbamol (ROBAXIN) 500 MG tablet Take 1 tablet (500 mg total) by mouth 2 (two) times daily. 20 tablet 0  . metoprolol tartrate (LOPRESSOR) 25 MG tablet Take 1 tablet (25 mg total) by mouth 2 (two) times daily. 60 tablet 1  . prochlorperazine (COMPAZINE) 10 MG tablet Take 1 tablet (10 mg total) by mouth every 6 (six) hours as needed. 30 tablet 2   No current facility-administered medications for this visit.    SURGICAL HISTORY:  Past Surgical History:  Procedure Laterality Date  . CESAREAN SECTION      REVIEW OF SYSTEMS:   Review of Systems  Constitutional: Positive for weight loss. Negative for appetite change, chills, fatigue, and fever. Marland Kitchen  HENT: Negative for mouth sores, nosebleeds, sore throat and trouble swallowing.  Eyes: Negative for eye problems and icterus.  Respiratory: Negative for cough, hemoptysis, shortness of breath and wheezing.  Cardiovascular: Positive for occasional left sided burning pain in the anterolateral side of the left chest. Negative for leg swelling.  Gastrointestinal: Positive for RUQ pain and  mild nausea without vomiting. Negative for constipation and diarrhea.  Genitourinary: Negative for bladder incontinence, difficulty urinating, dysuria, frequency and hematuria.   Musculoskeletal: Positive for occasional right shoulder pain. Negative for back pain, gait problem, neck pain and neck stiffness.  Skin: Negative for itching and rash.  Neurological: Negative for dizziness, extremity weakness, gait problem, headaches, light-headedness and seizures.  Hematological: Negative for adenopathy. Does not bruise/bleed easily.  Psychiatric/Behavioral: Negative for confusion, depression and sleep disturbance. The patient is not nervous/anxious.     PHYSICAL EXAMINATION:  Blood pressure (!) 142/90, pulse 72, temperature 98.5 F (36.9 C), temperature source Temporal, resp. rate 18, height 5\' 2"  (1.575 m), weight 121 lb (54.9 kg), SpO2 100 %.  ECOG PERFORMANCE STATUS: 1 - Symptomatic but completely ambulatory  Physical Exam  Constitutional: Oriented to person, place, and time and well-developed, well-nourished, and in no distress.  HENT:  Head: Normocephalic and atraumatic.  Mouth/Throat: Oropharynx is clear and moist. No oropharyngeal exudate.  Eyes: Conjunctivae are normal. Right eye exhibits no discharge. Left eye exhibits no discharge. No scleral icterus.  Neck: Normal range of motion. Neck supple.  Cardiovascular: Normal rate, regular rhythm, normal heart sounds and intact distal pulses.   Pulmonary/Chest: Effort normal and breath sounds normal. No respiratory distress. No wheezes. No rales.  Abdominal: Soft. RUQ pain to palpation. Bowel sounds are normal. Exhibits no distension and no mass.  Musculoskeletal: Normal range of motion. Exhibits no edema.  Lymphadenopathy:    No cervical adenopathy.  Neurological: Alert and oriented to person, place, and time. Exhibits normal muscle tone. Gait normal. Coordination normal.  Skin: Skin is warm and dry. No rash noted. Not diaphoretic. No  erythema. No pallor.  Psychiatric: Mood, memory and judgment normal.  Vitals reviewed.  LABORATORY DATA: Lab Results  Component Value Date   WBC 5.2 07/04/2019   HGB 11.2 (L) 07/04/2019   HCT 34.4 (L) 07/04/2019   MCV 95.6 07/04/2019   PLT 97 (L) 07/04/2019      Chemistry      Component Value Date/Time   NA 135 07/04/2019 0433   K 4.9 07/04/2019 0433   CL 103 07/04/2019 0433   CO2 24 07/04/2019 0433   BUN 7 07/04/2019 0433   CREATININE 0.57 07/04/2019 0433      Component Value Date/Time   CALCIUM 8.4 (L) 07/04/2019 0433   ALKPHOS 206 (H) 07/04/2019 0433   AST 173 (H) 07/04/2019 0433   ALT 54 (H) 07/04/2019 0433   BILITOT 1.8 (H) 07/04/2019 0433       RADIOGRAPHIC STUDIES:  DG Chest 2 View  Result Date: 06/21/2019 CLINICAL DATA:  Chest pain EXAM: CHEST - 2 VIEW COMPARISON:  None. FINDINGS: Cardiac shadow is within normal limits. Mild right basilar atelectasis is noted. No effusion is noted. No bony abnormality is seen. Aortic calcifications are noted. IMPRESSION: Mild right basilar atelectasis. Electronically Signed   By: Inez Catalina M.D.   On: 06/21/2019 22:05   CT HEAD WO CONTRAST  Result Date: 06/29/2019 CLINICAL DATA:  Headache, intracranial hemorrhage suspected. Additional history provided: Diffuse headache with pain radiating into neck ongoing for days. EXAM: CT HEAD WITHOUT CONTRAST TECHNIQUE: Contiguous axial images were obtained from the base of the skull through  the vertex without intravenous contrast. COMPARISON:  Head CT 07/03/2017, brain MRI 11/27/2016 FINDINGS: Brain: There is no evidence of acute intracranial hemorrhage, intracranial mass, midline shift or extra-axial fluid collection.No demarcated cortical infarction. Cerebral volume is normal for age. Vascular: No hyperdense vessel.  Atherosclerotic calcifications Skull: Normal. Negative for fracture or focal lesion. Sinuses/Orbits: Visualized orbits demonstrate no acute abnormality. No significant paranasal  sinus disease or mastoid effusion at the imaged levels. IMPRESSION: No evidence of acute intracranial abnormality. Specifically, no evidence of acute intracranial hemorrhage. Electronically Signed   By: Kellie Simmering DO   On: 06/29/2019 08:25   CT CHEST WO CONTRAST  Result Date: 06/30/2019 CLINICAL DATA:  54 year old female with history of cancer of unknown primary origin. EXAM: CT CHEST WITHOUT CONTRAST TECHNIQUE: Multidetector CT imaging of the chest was performed following the standard protocol without IV contrast. COMPARISON:  No prior chest CT. CT the abdomen and pelvis 06/28/2019. FINDINGS: Cardiovascular: Heart size is normal. There is no significant pericardial fluid, thickening or pericardial calcification. There is aortic atherosclerosis, as well as atherosclerosis of the great vessels of the mediastinum and the coronary arteries, including calcified atherosclerotic plaque in the left main and left anterior descending coronary arteries. Mediastinum/Nodes: Multiple prominent borderline enlarged mediastinal lymph nodes measuring up to 9 mm in short axis, nonspecific. Esophagus is unremarkable in appearance. No axillary lymphadenopathy. Lungs/Pleura: Multiple pulmonary nodules are again noted throughout the lungs bilaterally, concerning for widespread metastatic disease. These nodules generally range from 2-8 mm in size. In addition, at the right lung base in the right lower lobe there is a mass-like area which appears related to direct invasion across the right hemidiaphragm from large liver mass, better demonstrated on prior CT the abdomen and pelvis 06/28/2019 (see that report for full details). Trace volume of right pleural fluid lying dependently. No left pleural effusion. Upper Abdomen: Large mass in the right lobe of the liver poorly demonstrated on today's noncontrast CT examination. See report from contemporaneously obtained abdominal MRI 06/29/2019 for full description of findings beneath the  diaphragm. Musculoskeletal: There are no aggressive appearing lytic or blastic lesions noted in the visualized portions of the skeleton. IMPRESSION: 1. Multiple pulmonary nodules highly concerning for widespread metastatic disease to the lungs. 2. Large right lobe of the liver mass with trans diaphragmatic invasion into the lower right hemithorax involving portions of the right lower lobe. 3. Aortic atherosclerosis, in addition to left main and left anterior descending coronary artery disease. Please note that although the presence of coronary artery calcium documents the presence of coronary artery disease, the severity of this disease and any potential stenosis cannot be assessed on this non-gated CT examination. Assessment for potential risk factor modification, dietary therapy or pharmacologic therapy may be warranted, if clinically indicated. Aortic Atherosclerosis (ICD10-I70.0). Electronically Signed   By: Vinnie Langton M.D.   On: 06/30/2019 12:48   MR ABDOMEN W WO CONTRAST  Result Date: 06/30/2019 CLINICAL DATA:  54 year old female with liver lesion noted on prior CT examination. Follow-up study. EXAM: MRI ABDOMEN WITHOUT AND WITH CONTRAST TECHNIQUE: Multiplanar multisequence MR imaging of the abdomen was performed both before and after the administration of intravenous contrast. CONTRAST:  5.63mL GADAVIST GADOBUTROL 1 MMOL/ML IV SOLN COMPARISON:  No prior abdominal MRI. CT the abdomen and pelvis 06/28/2019. FINDINGS: Comment: Portions of today's examination are limited by extensive patient respiratory motion. Lower chest: Trace right pleural effusion lying dependently. Numerous small areas of increased signal intensity corresponding to pulmonary nodules noted throughout the visualize  lung bases. Hepatobiliary: Liver has a slightly shrunken appearance and nodular contour, indicative of underlying cirrhosis. There is heterogeneous signal intensity throughout the liver, most evident in the right lobe of  the liver where there are widespread somewhat ill-defined areas of mild T2 hyperintensity and T1 hypointensity. In the superior aspect of segment 7 of the liver there is a well-defined mass (axial image 9 of series 10 and coronal image 9 of series 4) measuring 4.4 x 4.4 x 4.1 cm, which demonstrates heterogeneous signal intensity on T1 and T2 weighted images, but generally has a peripheral rim of T2 hypointensity, and demonstrates heterogeneous internal enhancement on post gadolinium images without a specific enhancement pattern. Extending cephalad from this lesion there is additional irregular malignant appearing soft tissue which appears to directly invade the right hemidiaphragm best appreciated on coronal image 10 of series 4 and axial image 6 of series 5 where this is estimated to measure approximately 8.6 x 7.2 x 3.3 cm. This may also directly invade the inferior aspect of the right lower lobe, although this is difficult to say for certain on today's magnetic resonance examination. Notably, the ill-defined areas of T2 hyperintensity throughout the right lobe of the liver correspond to relatively diffuse arterial phase hyperenhancement on post gadolinium images which decreased on subsequent post gadolinium delayed imaging. Markedly heterogeneous perfusion is evident throughout the posterior aspect of the right lobe of the liver, predominantly segments 7 and 6, suggesting widespread infiltrative poorly defined neoplasm in these regions (although some of this could be accounted for by the altered perfusion throughout this portion of the liver related to portal and hepatic venous obstruction). The right branches of the portal vein appear dilated and do not demonstrate normal portal venous enhancement, with abrupt truncation of the right main portal vein best appreciated on axial image 42 of series 19), suggesting extensive tumor thrombus in the portal venous system of the right lobe of the liver. There is also  incomplete opacification of the right hepatic vein (axial image 36 of series 19), suggesting additional tumor thrombus in the right hepatic vein. No intra or extrahepatic biliary ductal dilatation. Gallbladder is moderately distended with diffuse gallbladder wall edema, but no overt surrounding inflammatory changes. No filling defects in the gallbladder to suggest cholelithiasis. Pancreas: No pancreatic mass. No pancreatic ductal dilatation. No pancreatic or peripancreatic fluid collections or inflammatory changes. Spleen: Spleen appears borderline enlarged measuring 11.3 x 5.7 x 11.2 cm (estimated splenic volume of 361 mL) . Adrenals/Urinary Tract: Bilateral kidneys and adrenal glands are normal in appearance. No hydroureteronephrosis in the visualized portions of the abdomen. Stomach/Bowel: Visualized portions are unremarkable. Vascular/Lymphatic: No aneurysm identified in the visualized abdominal vasculature. Probable tumor thrombus in the right portal and hepatic venous systems, as detailed above. Numerous serpiginous densities adjacent to the proximal stomach and distal esophagus, compatible with varices. No definite lymphadenopathy confidently identified in the abdomen. Other: No significant volume of ascites noted in the visualized portions of the peritoneal cavity. Musculoskeletal: No aggressive appearing osseous lesions are noted in the visualized portions of the skeleton. IMPRESSION: 1. Cirrhosis with probable diffusely infiltrative hepatocellular carcinoma throughout the right lobe of the liver, including a more well encapsulated area in segment 7 near the dome. This appears to extend out of the hepatic capsule invading the right hemidiaphragm which is enlarged, and potentially invading the lower right hemithorax with possible involvement of the inferior aspect of the right lower lobe. Trace right pleural effusion is likely malignant. Tumor thrombus involving the right  portal and hepatic venous systems,  as above. 2. Multiple small pulmonary nodules scattered throughout the visualize lung bases compatible with widespread metastatic disease to the lungs, better demonstrated on chest CT 06/30/2019. 3. Moderate dilatation of the gallbladder with diffuse gallbladder wall edema. However, there are no gallstones and there is no pericholecystic fluid. This likely reflects gallbladder wall edema from intrinsic hepatic disease, and is not favored to indicate an acute cholecystitis at this time. 4. Borderline splenomegaly. 5. 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 Electronically Signed   By: Vinnie Langton M.D.   On: 06/30/2019 13:12   NM Bone Scan Whole Body  Result Date: 07/02/2019 CLINICAL DATA:  Unknown primary malignancy. EXAM: NUCLEAR MEDICINE WHOLE BODY BONE SCAN TECHNIQUE: Whole body anterior and posterior images were obtained approximately 3 hours after intravenous injection of radiopharmaceutical. RADIOPHARMACEUTICALS:  21.9 mCi Technetium-29m MDP IV COMPARISON:  None. FINDINGS: Degenerative changes are seen involving both shoulders. No other areas of abnormal uptake are noted. IMPRESSION: No definite scintigraphic evidence of osseous metastases. Electronically Signed   By: Marijo Conception M.D.   On: 07/02/2019 14:56   CT ABDOMEN PELVIS W CONTRAST  Result Date: 06/28/2019 CLINICAL DATA:  Right lower quadrant pain EXAM: CT ABDOMEN AND PELVIS WITH CONTRAST TECHNIQUE: Multidetector CT imaging of the abdomen and pelvis was performed using the standard protocol following bolus administration of intravenous contrast. CONTRAST:  137mL OMNIPAQUE IOHEXOL 300 MG/ML  SOLN COMPARISON:  Ultrasound 06/28/2019 FINDINGS: Lower chest: Lung bases demonstrate numerous pulmonary nodules measuring up to 8 mm in size concerning for metastatic disease. Heart size within normal limits. Hepatobiliary: Heterogenous liver. Subtle contour nodularity suggesting cirrhosis. No calcified gallstone. Small amount of  pericholecystic fluid or wall thickening though normal appearance of the gallbladder by sonography. Large heterogenous mass that appears to be within the posterior right hepatic lobe. This measures approximately 8.2 x 6.9 by 8.8 cm and contains calcifications. The mass may invade the right diaphragm given its appearance on coronal views. Additional smaller mass measuring 2.9 cm, series 3, image number 28 more inferiorly within the right hepatic lobe. Suspect that there may be additional vague hypodense masses in the right lobe. Pancreas: Unremarkable. No pancreatic ductal dilatation or surrounding inflammatory changes. Spleen: Borderline enlarged. Adrenals/Urinary Tract: Left adrenal gland is normal. Questionable involvement of right adrenal gland by the hepatic mass lesion, series 3, image number 21. Kidneys show no hydronephrosis. The bladder is normal Stomach/Bowel: Stomach nonenlarged. No dilated small bowel. Diverticular disease of the colon. Possible mild wall thickening at the cecum and ascending colon. The appendix is negative. Vascular/Lymphatic: Moderate aortic atherosclerosis. No aneurysm. Small porta hepatis and gastrohepatic nodes measuring up to 16 mm in size, series 3, image number 30. Reproductive: Uterus and bilateral adnexa are unremarkable. Other: No free air. Small amount of ascites within the abdomen and pelvis. Diffuse hazy appearance of the mesentery. Musculoskeletal: Bones appear slightly dense but are without focal lytic or focal sclerotic process. IMPRESSION: 1. Probable liver cirrhosis. Large heterogeneous suspicion mass measuring at least 8.8 cm, probably arising from the right hepatic lobe of the liver but extending cephalad toward the right lung base with possible violation of the capsule and invasion/involvement of the right diaphragm. At least 1 additional smaller solid mass in the inferior right hepatic lobe either representing metastatic disease or multifocal disease. Correlation  with MRI should be considered. 2. Numerous pulmonary nodules consistent with metastatic disease 3. Possible gallbladder wall thickening or small amount of pericholecystic fluid as seen on  recent ultrasound 4. Diffuse diverticular disease of the colon with possible wall thickening/mild acute inflammation at the cecum and ascending colon. 5. Small amount of free fluid in the pelvis. Electronically Signed   By: Donavan Foil M.D.   On: 06/28/2019 23:41   US BIOPSY (LIVER)  Result Date: 07/03/2019 CLINICAL DATA:  Right hepatic mass EXAM: ULTRASOUND-GUIDED CORE LIVER LESION BIOPSY TECHNIQUE: An ultrasound guided liver biopsy was thoroughly discussed with the patient and questions were answered. The benefits, risks, alternatives, and complications were also discussed. The patient understands and wishes to proceed with the procedure. A verbal as well as written consent was obtained. Survey ultrasound of the liver was performed, the lesion identified, and an appropriate skin entry site was determined. Skin site was marked, prepped with chlorhexidine, and draped in usual sterile fashion, and infiltrated locally with 1% lidocaine. Intravenous Fentanyl 146mcg and Versed 2mg  were administered as conscious sedation during continuous monitoring of the patient's level of consciousness and physiological / cardiorespiratory status by the radiology RN, with a total moderate sedation time of 11 minutes. A 17 gauge trocar needle was advanced under ultrasound guidance into the liver to the margin of the lesion. Multiple coaxial 18gauge core samples were then obtained through the guide needle. The guide needle was removed. Post procedure scans demonstrate no apparent complication. COMPLICATIONS: COMPLICATIONS None immediate FINDINGS: Echogenic posterior right hepatic lobe liver lesion was localized. Representative core biopsy samples obtained as above. IMPRESSION: 1. Technically successful ultrasound guided core liver lesion biopsy.  Electronically Signed   By: Lucrezia Europe M.D.   On: 07/03/2019 11:18   DG Chest Port 1 View  Result Date: 06/28/2019 CLINICAL DATA:  Cough and shortness of breath. EXAM: PORTABLE CHEST 1 VIEW COMPARISON:  Aug 21, 2019 FINDINGS: The cardiac silhouette is enlarged. There is vascular congestion with developing pulmonary edema. There are small bilateral pleural effusions. Aortic calcifications are noted. Bibasilar atelectasis is noted. There is no acute osseous abnormality. IMPRESSION: Findings suggestive of congestive heart failure. Electronically Signed   By: Constance Holster M.D.   On: 06/28/2019 21:43   ECHOCARDIOGRAM COMPLETE  Result Date: 06/30/2019    ECHOCARDIOGRAM REPORT   Patient Name:   ANAIS DENSLOW Date of Exam: 06/30/2019 Medical Rec #:  [id number]    Height:       62.0 in Accession #:    [id number]   Weight:       125.6 lb Date of Birth:  Dec 08, 1965    BSA:          1.569 m Patient Age:    32 years     BP:           147/95 mmHg Patient Gender: F            HR:           71 bpm. Exam Location:  Inpatient Procedure: 2D Echo, Cardiac Doppler and Color Doppler Indications:    Pulmonary hypertension 416.8/I27.2  History:        Patient has no prior history of Echocardiogram examinations.                 Risk Factors:Current Smoker. Thrombocytopenia. Liver cirrhosis.                 SIRS. Hepatitis C. Marijuana abuse.  Sonographer:    Clayton Lefort RDCS (AE) Referring Phys: [id number] [place]  Sonographer Comments: Patient movement. Echocardiogram ended before protocol complete due to patient stating she could not tolerate laying  on back or left side. Nurse notified. IMPRESSIONS  1. Limited images, incomplete study.  2. Left ventricular ejection fraction, by estimation, is 60 to 65%. The left ventricle has normal function. The left ventricle has no regional wall motion abnormalities. Left ventricular diastolic function could not be evaluated.  3. Right ventricular systolic function is normal. The right  ventricular size is normal. Tricuspid regurgitation signal is inadequate for assessing PA pressure.  4. The mitral valve is grossly normal. Trivial mitral valve regurgitation.  5. The aortic valve is tricuspid. Aortic valve regurgitation is not visualized. FINDINGS  Left Ventricle: Left ventricular ejection fraction, by estimation, is 60 to 65%. The left ventricle has normal function. The left ventricle has no regional wall motion abnormalities. The left ventricular internal cavity size was normal in size. There is  borderline left ventricular hypertrophy. Left ventricular diastolic function could not be evaluated. Right Ventricle: The right ventricular size is normal. No increase in right ventricular wall thickness. Right ventricular systolic function is normal. Tricuspid regurgitation signal is inadequate for assessing PA pressure. Left Atrium: Left atrial size was normal in size. Right Atrium: Right atrial size was normal in size. Pericardium: There is no evidence of pericardial effusion. Mitral Valve: The mitral valve is grossly normal. Trivial mitral valve regurgitation. Tricuspid Valve: The tricuspid valve is grossly normal. Tricuspid valve regurgitation is mild. Aortic Valve: The aortic valve is tricuspid. Aortic valve regurgitation is not visualized. Mild aortic valve annular calcification. Pulmonic Valve: The pulmonic valve was grossly normal. Pulmonic valve regurgitation is mild. Aorta: The aortic root is normal in size and structure. Venous: The inferior vena cava was not well visualized. IAS/Shunts: No atrial level shunt detected by color flow Doppler.  LEFT VENTRICLE PLAX 2D LVIDd:         4.90 cm LVIDs:         3.10 cm LV PW:         1.10 cm LV IVS:        0.90 cm LVOT diam:     1.80 cm LVOT Area:     2.54 cm  LEFT ATRIUM         Index LA diam:    3.60 cm 2.29 cm/m   AORTA Ao Root diam: 2.60 cm Ao Asc diam:  3.20 cm TRICUSPID VALVE TR Peak grad:   22.1 mmHg TR Vmax:        235.00 cm/s  SHUNTS  Systemic Diam: 1.80 cm Rozann Lesches MD Electronically signed by Rozann Lesches MD Signature Date/Time: 06/30/2019/11:31:13 AM    Final    US Abdomen Limited RUQ  Result Date: 06/28/2019 CLINICAL DATA:  Chest, right upper quadrant pain for 3 weeks, nausea, vomiting and fevers, history of cirrhosis EXAM: ULTRASOUND ABDOMEN LIMITED RIGHT UPPER QUADRANT COMPARISON:  None. FINDINGS: Gallbladder: Mildly contracted appearance of the gallbladder. No visible calcified gallstones, significant gallbladder wall thickening. Trace pericholecystic fluid. Sonographic Percell Miller sign is reportedly negative. Common bile duct: Diameter: 1.3 mm proximally, nondilated. Distal portion not visualized. Liver: There is a heterogeneous, centrally hyperechoic, peripherally hypoechoic lesion with a thin capsule in the right lobe liver measuring 3.9 x 4.0 x 4.6 cm. Coarsened heterogeneous background parenchymal echogenicity with a nodular hepatic surface contour compatible with history of cirrhosis. Portal vein is patent on color Doppler imaging with normal direction of blood flow towards the liver. Other: None. IMPRESSION: Under distension of the gallbladder. Trace pericholecystic fluid is nonspecific with absence of other features of acute cholecystitis. Should be considered within the appropriate  clinical setting. Heterogeneous 4.6 cm mass in the posterior right lobe liver, highly concerning for hepatocellular carcinoma given background of cirrhosis. Consider further characterization with multiphase MRI or CT, preferably on an outpatient basis when patient is better able to tolerate scanning procedure. Electronically Signed   By: Lovena Le M.D.   On: 06/28/2019 22:16     ASSESSMENT/PLAN:  1. Hepatocellular carcinoma. Unresectable. Stage IV. [place]  -CT 06/28/2019-probable cirrhosis, large heterogenous mass in the right liver extending to the right lung base with possible violation of the capsule and invasion of the right  hemidiaphragm, at least 1 additional mass in the inferior right liver -06/29/2019 MRI of the abdomen-probable diffusely infiltrative hepatocellular carcinoma throughout the right lobe of the liver which appears to extend out of the hepatic capsule and invade the right hemidiaphragm, tumor thrombus involving the right portal and hepatic veins, esophageal and proximal gastric varices -06/30/2019 CT of the chest without contrast-multiple pulmonary nodules concerning for widespread metastatic disease to the lungs, large right lobe liver mass with transdiaphragmatic invasion into the lower right hemithorax involving portions of the right lower lobe -07/01/2019 -299,242 -07/02/19: Bone scan: no evidence of osseous metastases  -Ultrasound biopsy right liver 07/03/2019-hepatocellular carcinoma -07/12/19 cycle #1 Tecentriq and Avastin  2. Hepatitis C -Reports prior treatment with 16 weeks of interferon ribavirin -06/29/2019 HCV RNA quantitative 634,000 -Followed by infectious disease.  3. Cirrhosis 4. Pseudoseizures 5. Bipolar disorder 6. Anemia and thrombocytopenia due to liver disease 7. Elevated LFTs  Disposition:  The patient is here to receive her first cycle of treatment with Tecentriq and Avastin.  We re-discussed the potential associated with Avastin including the chance for hypertension, bleeding, thromboembolic disease, bowel perforation, delayed wound healing, CNS toxicity, and nephrotoxicity.  We discussed the rash, diarrhea, hypothyroidism, and other autoimmune toxicities seen with PD1 inhibitors. She is interested in proceeding with cycle #1 today as scheduled.   We will obtain CBC, CMP, urine protein, and TSH with treatment.   I have sent a prescription for compazine to her pharmacy for her nausea.   We will see her back for a follow up visit in 3 weeks before starting cycle #2.   She will continue with her current pain regimen with Dilaudid for severe pain and tramadol for milder  pain. She will continue with gabapentin.   The patient was seen with Dr. Benay Spice today.   Orders Placed This Encounter  Procedures  . Total Protein, Urine dipstick    Standing Status:   Standing    Number of Occurrences:   18    Standing Expiration Date:   07/11/2020     Tobe Sos Anaka Beazer, PA-C 07/12/19  Ms. Ballester was reviewed and examined.  She will begin treatment with atezolizumab/Avastin today.  The treatment plan was discussed with Ms. Rovito and her daughter.  Julieanne Manson, MD

## 2019-07-11 NOTE — Progress Notes (Signed)
Pt seen by PA Lucianne Lei only, no assessment by [facility] RN at this time, PA aware.  Pt brought to [facility] for evaluation from education dept by Lanny Cramp for breast pain.

## 2019-07-12 ENCOUNTER — Encounter: Payer: Self-pay | Admitting: General Practice

## 2019-07-12 ENCOUNTER — Inpatient Hospital Stay: Payer: Medicaid Other

## 2019-07-12 ENCOUNTER — Other Ambulatory Visit: Payer: Self-pay

## 2019-07-12 ENCOUNTER — Inpatient Hospital Stay: Payer: Medicaid Other | Admitting: General Practice

## 2019-07-12 ENCOUNTER — Inpatient Hospital Stay (HOSPITAL_BASED_OUTPATIENT_CLINIC_OR_DEPARTMENT_OTHER): Payer: Medicaid Other | Admitting: Physician Assistant

## 2019-07-12 VITALS — BP 142/90 | HR 72 | Temp 98.5°F | Resp 18 | Ht 62.0 in | Wt 121.0 lb

## 2019-07-12 DIAGNOSIS — Z5111 Encounter for antineoplastic chemotherapy: Secondary | ICD-10-CM | POA: Insufficient documentation

## 2019-07-12 DIAGNOSIS — Z5112 Encounter for antineoplastic immunotherapy: Secondary | ICD-10-CM | POA: Insufficient documentation

## 2019-07-12 DIAGNOSIS — C220 Liver cell carcinoma: Secondary | ICD-10-CM

## 2019-07-12 DIAGNOSIS — Z7189 Other specified counseling: Secondary | ICD-10-CM

## 2019-07-12 MED ORDER — PROCHLORPERAZINE MALEATE 10 MG PO TABS: 10.0000 mg | ORAL_TABLET | Freq: Four times a day (QID) | ORAL | 2 refills | Status: DC | PRN

## 2019-07-12 MED ORDER — SODIUM CHLORIDE 0.9 % IV SOLN
1200.0000 mg | Freq: Once | INTRAVENOUS | Status: AC
  Administered 2019-07-12: 1200 mg via INTRAVENOUS
  Filled 2019-07-12: qty 20

## 2019-07-12 MED ORDER — SODIUM CHLORIDE 0.9 % IV SOLN
Freq: Once | INTRAVENOUS | Status: AC
  Filled 2019-07-12: qty 250

## 2019-07-12 MED ORDER — SODIUM CHLORIDE 0.9 % IV SOLN
15.0000 mg/kg | Freq: Once | INTRAVENOUS | Status: AC
  Administered 2019-07-12: 14:00:00 900 mg via INTRAVENOUS
  Filled 2019-07-12: qty 32

## 2019-07-12 NOTE — Progress Notes (Signed)
Per Cassie Heilingoetter, PA-C to use labs from 3/25 for D1C1 treatment.

## 2019-07-12 NOTE — Progress Notes (Signed)
[place] CSW Progress Notes  Met w patient briefly in infusion.  Reviewed status of Medicaid and disability applications.  Per Estate manager/land agent, case was assigned to [employer] - patient states she has not received any paperwork or communication on this - CSW will follow up w Linwood Dibbles.  Took information from patient to submit to [place] for help w disability application. Patient states she is sole support of her family which includes daughter (21 in school for Art gallery manager training) and grandson.  Patient would like to return to work, last paycheck received 3/24.  She will also be referred to [employer] for help w [employer].  Received first $67 disbursement from [place] from [facility], can receive up to 3 more similar disbursements.  Edwyna Shell, LCSW Clinical Social Worker Phone:  [phone] Cell:  [phone]

## 2019-07-12 NOTE — Patient Instructions (Signed)
[place] Discharge Instructions for Patients Receiving Chemotherapy  Today you received the following chemotherapy agents: Atexolizumab, and Bevacizumab.   To help prevent nausea and vomiting after your treatment, we encourage you to take your nausea medication as directed.  If you develop nausea and vomiting that is not controlled by your nausea medication, call the clinic.   BELOW ARE SYMPTOMS THAT SHOULD BE REPORTED IMMEDIATELY:  *FEVER GREATER THAN 100.5 F  *CHILLS WITH OR WITHOUT FEVER  NAUSEA AND VOMITING THAT IS NOT CONTROLLED WITH YOUR NAUSEA MEDICATION  *UNUSUAL SHORTNESS OF BREATH  *UNUSUAL BRUISING OR BLEEDING  TENDERNESS IN MOUTH AND THROAT WITH OR WITHOUT PRESENCE OF ULCERS  *URINARY PROBLEMS  *BOWEL PROBLEMS  UNUSUAL RASH Items with * indicate a potential emergency and should be followed up as soon as possible.  Feel free to call the clinic should you have any questions or concerns. The clinic phone number is [phone].  Please show the [facility] at check-in to the Emergency Department and triage nurse.  Atezolizumab injection What is this medicine? ATEZOLIZUMAB (a te zoe LIZ ue mab) is a monoclonal antibody. It is used to treat bladder cancer (urothelial cancer), liver cancer, lung cancer, breast cancer, and melanoma. This medicine may be used for other purposes; ask your health care provider or pharmacist if you have questions. COMMON BRAND NAME(S): Tecentriq What should I tell my health care provider before I take this medicine? They need to know if you have any of these conditions:  diabetes  immune system problems  infection  inflammatory bowel disease  liver disease  lung or breathing disease  lupus  nervous system problems like myasthenia gravis or Guillain-Barre syndrome  organ transplant  an unusual or allergic reaction to atezolizumab, other medicines, foods, dyes, or preservatives  pregnant or trying  to get pregnant  breast-feeding How should I use this medicine? This medicine is for infusion into a vein. It is given by a health care professional in a hospital or clinic setting. A special MedGuide will be given to you before each treatment. Be sure to read this information carefully each time. Talk to your pediatrician regarding the use of this medicine in children. Special care may be needed. Overdosage: If you think you have taken too much of this medicine contact a poison control center or emergency room at once. NOTE: This medicine is only for you. Do not share this medicine with others. What if I miss a dose? It is important not to miss your dose. Call your doctor or health care professional if you are unable to keep an appointment. What may interact with this medicine? Interactions have not been studied. This list may not describe all possible interactions. Give your health care provider a list of all the medicines, herbs, non-prescription drugs, or dietary supplements you use. Also tell them if you smoke, drink alcohol, or use illegal drugs. Some items may interact with your medicine. What should I watch for while using this medicine? Your condition will be monitored carefully while you are receiving this medicine. You may need blood work done while you are taking this medicine. Do not become pregnant while taking this medicine or for at least 5 months after stopping it. Women should inform their doctor if they wish to become pregnant or think they might be pregnant. There is a potential for serious side effects to an unborn child. Talk to your health care professional or pharmacist for more information. Do not breast-feed an  infant while taking this medicine or for at least 5 months after the last dose. What side effects may I notice from receiving this medicine? Side effects that you should report to your doctor or health care professional as soon as possible:  allergic reactions  like skin rash, itching or hives, swelling of the face, lips, or tongue  black, tarry stools  bloody or watery diarrhea  breathing problems  changes in vision  chest pain or chest tightness  chills  facial flushing  fever  headache  signs and symptoms of high blood sugar such as dizziness; dry mouth; dry skin; fruity breath; nausea; stomach pain; increased hunger or thirst; increased urination  signs and symptoms of liver injury like dark yellow or brown urine; general ill feeling or flu-like symptoms; light-colored stools; loss of appetite; nausea; right upper belly pain; unusually weak or tired; yellowing of the eyes or skin  stomach pain  trouble passing urine or change in the amount of urine Side effects that usually do not require medical attention (report to your doctor or health care professional if they continue or are bothersome):  bone pain  cough  diarrhea  joint pain  muscle pain  muscle weakness  swelling of arms or legs  tiredness  weight loss This list may not describe all possible side effects. Call your doctor for medical advice about side effects. You may report side effects to FDA at 1-800-FDA-1088. Where should I keep my medicine? This drug is given in a hospital or clinic and will not be stored at home. NOTE: This sheet is a summary. It may not cover all possible information. If you have questions about this medicine, talk to your doctor, pharmacist, or health care provider.  2020 Elsevier/Gold Standard (2018-11-16 13:11:14)  Bevacizumab injection What is this medicine? BEVACIZUMAB (be va SIZ yoo mab) is a monoclonal antibody. It is used to treat many types of cancer. This medicine may be used for other purposes; ask your health care provider or pharmacist if you have questions. COMMON BRAND NAME(S): Avastin, MVASI, Zirabev What should I tell my health care provider before I take this medicine? They need to know if you have any of these  conditions:  diabetes  heart disease  high blood pressure  history of coughing up blood  prior anthracycline chemotherapy (e.g., doxorubicin, daunorubicin, epirubicin)  recent or ongoing radiation therapy  recent or planning to have surgery  stroke  an unusual or allergic reaction to bevacizumab, hamster proteins, mouse proteins, other medicines, foods, dyes, or preservatives  pregnant or trying to get pregnant  breast-feeding How should I use this medicine? This medicine is for infusion into a vein. It is given by a health care professional in a hospital or clinic setting. Talk to your pediatrician regarding the use of this medicine in children. Special care may be needed. Overdosage: If you think you have taken too much of this medicine contact a poison control center or emergency room at once. NOTE: This medicine is only for you. Do not share this medicine with others. What if I miss a dose? It is important not to miss your dose. Call your doctor or health care professional if you are unable to keep an appointment. What may interact with this medicine? Interactions are not expected. This list may not describe all possible interactions. Give your health care provider a list of all the medicines, herbs, non-prescription drugs, or dietary supplements you use. Also tell them if you smoke, drink alcohol, or  use illegal drugs. Some items may interact with your medicine. What should I watch for while using this medicine? Your condition will be monitored carefully while you are receiving this medicine. You will need important blood work and urine testing done while you are taking this medicine. This medicine may increase your risk to bruise or bleed. Call your doctor or health care professional if you notice any unusual bleeding. Before having surgery, talk to your health care provider to make sure it is ok. This drug can increase the risk of poor healing of your surgical site or  wound. You will need to stop this drug for 28 days before surgery. After surgery, wait at least 28 days before restarting this drug. Make sure the surgical site or wound is healed enough before restarting this drug. Talk to your health care provider if questions. Do not become pregnant while taking this medicine or for 6 months after stopping it. Women should inform their doctor if they wish to become pregnant or think they might be pregnant. There is a potential for serious side effects to an unborn child. Talk to your health care professional or pharmacist for more information. Do not breast-feed an infant while taking this medicine and for 6 months after the last dose. This medicine has caused ovarian failure in some women. This medicine may interfere with the ability to have a child. You should talk to your doctor or health care professional if you are concerned about your fertility. What side effects may I notice from receiving this medicine? Side effects that you should report to your doctor or health care professional as soon as possible:  allergic reactions like skin rash, itching or hives, swelling of the face, lips, or tongue  chest pain or chest tightness  chills  coughing up blood  high fever  seizures  severe constipation  signs and symptoms of bleeding such as bloody or black, tarry stools; red or dark-brown urine; spitting up blood or brown material that looks like coffee grounds; red spots on the skin; unusual bruising or bleeding from the eye, gums, or nose  signs and symptoms of a blood clot such as breathing problems; chest pain; severe, sudden headache; pain, swelling, warmth in the leg  signs and symptoms of a stroke like changes in vision; confusion; trouble speaking or understanding; severe headaches; sudden numbness or weakness of the face, arm or leg; trouble walking; dizziness; loss of balance or coordination  stomach pain  sweating  swelling of legs or  ankles  vomiting  weight gain Side effects that usually do not require medical attention (report to your doctor or health care professional if they continue or are bothersome):  back pain  changes in taste  decreased appetite  dry skin  nausea  tiredness This list may not describe all possible side effects. Call your doctor for medical advice about side effects. You may report side effects to FDA at 1-800-FDA-1088. Where should I keep my medicine? This drug is given in a hospital or clinic and will not be stored at home. NOTE: This sheet is a summary. It may not cover all possible information. If you have questions about this medicine, talk to your doctor, pharmacist, or health care provider.  2020 Elsevier/Gold Standard (2019-01-23 10:50:46)

## 2019-07-12 NOTE — Progress Notes (Signed)
[facility] Progress Note   Valerie [name] [id number] 1966/03/06 54 y.o.  Valerie [name] is managed by Dr. Dominica Severin B. Sherrill  Actively treated with chemotherapy/immunotherapy/hormonal therapy: Scheduled to begin Avastin and Tecentriq tomorrow.  Next scheduled appointment with provider: 07/12/2019  Assessment: Plan:    Hepatocellular carcinoma ([place])  Left-sided chest wall pain - Plan: gabapentin (NEURONTIN) 300 MG capsule   Stage IV primary hepatocellular carcinoma: The patient is scheduled to begin Avastin and Tecentriq tomorrow under the direction of Dr. Dominica Severin B. Sherrill.  She will be seen tomorrow prior to the start of her therapy.  Left-sided chest wall pain: The patient's description of her left-sided chest wall pain is consistent with neuropathic pain.  We discussed that narcotics do not tend to improve neuropathic pain.  The patient is agreeable to begin Neurontin 300 mg p.o. twice daily.  She will begin this this afternoon and will take her second dose at bedtime.  Her dose has been written that she can increase it to 3 times daily after 1 week if needed.  I told Ms. Cunning that I would see her tomorrow at some point during her treatment to assess the benefits of this therapy.  Please see After Visit Summary for patient specific instructions.  Future Appointments  Date Time Provider [place]  07/22/2019 10:10 AM Charlott Rakes, MD [facility] None    No orders of the defined types were placed in this encounter.      Subjective:   Patient ID:  Valerie [name] is a 54 y.o. (DOB Dec 17, 1965) female.  Chief Complaint:  Chief Complaint  Patient presents with  . Breast Pain    Left    HPI Valerie [name]  is a 54 y.o. female with a diagnosis of stage IV primary hepatocellular carcinoma.  She is seen today after her chemotherapy education visit.  She is having significant left lateral chest wall pain.  She has had this pain for several days.  She describes it  as a burning sensation.  She is on Dilaudid 2 mg every 4 hours without relief of this pain.  She is scheduled to begin Avastin and Tecentriq tomorrow under the direction of Dr. Dominica Severin B. Sherrill.  She will be seen tomorrow prior to the start of her therapy.  She denies any change in activity or trauma.  Medications: I have reviewed the patient's current medications.  Allergies:  Allergies  Allergen Reactions  . Norco [Hydrocodone-Acetaminophen] Anaphylaxis and Shortness Of Breath  . Tylenol [Acetaminophen] Other (See Comments)    Cannot take due to liver    Past Medical History:  Diagnosis Date  . Bipolar disorder ([place])   . Hepatitis C   . Liver cirrhosis ([place])   . Pseudoseizures     Past Surgical History:  Procedure Laterality Date  . CESAREAN SECTION      Family History  Problem Relation Age of Onset  . Heart disease Neg Hx     Social History   Socioeconomic History  . Marital status: Unknown    Spouse name: Not on file  . Number of children: 6  . Years of education: Not on file  . Highest education level: Not on file  Occupational History  . Not on file  Tobacco Use  . Smoking status: Current Every Day Smoker    Packs/day: 0.50    Types: Cigarettes  . Smokeless tobacco: Never Used  Substance and Sexual Activity  . Alcohol use: No  . Drug use: No  .  Sexual activity: Not on file  Other Topics Concern  . Not on file  Social History Narrative  . Not on file   Social Determinants of Health   Financial Resource Strain:   . Difficulty of Paying Living Expenses:   Food Insecurity:   . Worried About Charity fundraiser in the Last Year:   . Arboriculturist in the Last Year:   Transportation Needs:   . Film/video editor (Medical):   Marland Kitchen Lack of Transportation (Non-Medical):   Physical Activity:   . Days of Exercise per Week:   . Minutes of Exercise per Session:   Stress:   . Feeling of Stress :   Social Connections:   . Frequency of Communication with  Friends and Family:   . Frequency of Social Gatherings with Friends and Family:   . Attends Religious Services:   . Active Member of Clubs or Organizations:   . Attends Archivist Meetings:   Marland Kitchen Marital Status:   Intimate Partner Violence:   . Fear of Current or Ex-Partner:   . Emotionally Abused:   Marland Kitchen Physically Abused:   . Sexually Abused:     Past Medical History, Surgical history, Social history, and Family history were reviewed and updated as appropriate.   Please see review of systems for further details on the patient's review from today.   Review of Systems:  Review of Systems  Constitutional: Negative for activity change, chills, diaphoresis and fever.  Respiratory: Negative for cough, chest tightness and shortness of breath.   Cardiovascular: Positive for chest pain.    Objective:   Physical Exam:  There were no vitals taken for this visit. ECOG: 1  Physical Exam Constitutional:      Appearance: She is not ill-appearing, toxic-appearing or diaphoretic.     Comments: The patient is a pleasant adult female who appears to be in a moderate amount of pain.  She is unable to sit comfortably due to her pain.  HENT:     Head: Normocephalic and atraumatic.  Eyes:     General: No scleral icterus.       Right eye: No discharge.        Left eye: No discharge.     Conjunctiva/sclera: Conjunctivae normal.  Cardiovascular:     Rate and Rhythm: Normal rate and regular rhythm.     Heart sounds: No murmur. No friction rub. No gallop.   Pulmonary:     Effort: Pulmonary effort is normal. No respiratory distress.     Breath sounds: Normal breath sounds. No wheezing, rhonchi or rales.  Musculoskeletal:        General: Tenderness (Tenderness along the left lateral ribs immediately inferior to the left axilla.) present. No swelling, deformity or signs of injury.  Neurological:     Mental Status: She is alert.     Coordination: Coordination normal.     Gait: Gait normal.    Psychiatric:        Mood and Affect: Mood normal.        Behavior: Behavior normal.        Thought Content: Thought content normal.        Judgment: Judgment normal.     Lab Review:     Component Value Date/Time   NA 135 07/04/2019 0433   K 4.9 07/04/2019 0433   CL 103 07/04/2019 0433   CO2 24 07/04/2019 0433   GLUCOSE 127 (H) 07/04/2019 0433   BUN 7 07/04/2019 0433  CREATININE 0.57 07/04/2019 0433   CALCIUM 8.4 (L) 07/04/2019 0433   PROT 6.3 (L) 07/04/2019 0433   ALBUMIN 2.3 (L) 07/04/2019 0433   AST 173 (H) 07/04/2019 0433   ALT 54 (H) 07/04/2019 0433   ALKPHOS 206 (H) 07/04/2019 0433   BILITOT 1.8 (H) 07/04/2019 0433   GFRNONAA >60 07/04/2019 0433   GFRAA >60 07/04/2019 0433       Component Value Date/Time   WBC 5.2 07/04/2019 0433   RBC 3.60 (L) 07/04/2019 0433   HGB 11.2 (L) 07/04/2019 0433   HCT 34.4 (L) 07/04/2019 0433   PLT 97 (L) 07/04/2019 0433   MCV 95.6 07/04/2019 0433   MCH 31.1 07/04/2019 0433   MCHC 32.6 07/04/2019 0433   RDW 16.0 (H) 07/04/2019 0433   LYMPHSABS 1.3 07/04/2019 0433   MONOABS 0.6 07/04/2019 0433   EOSABS 0.2 07/04/2019 0433   BASOSABS 0.0 07/04/2019 0433   -------------------------------  Imaging from last 24 hours (if applicable):  Radiology interpretation: DG Chest 2 View  Result Date: 06/21/2019 CLINICAL DATA:  Chest pain EXAM: CHEST - 2 VIEW COMPARISON:  None. FINDINGS: Cardiac shadow is within normal limits. Mild right basilar atelectasis is noted. No effusion is noted. No bony abnormality is seen. Aortic calcifications are noted. IMPRESSION: Mild right basilar atelectasis. Electronically Signed   By: Inez Catalina M.D.   On: 06/21/2019 22:05   CT HEAD WO CONTRAST  Result Date: 06/29/2019 CLINICAL DATA:  Headache, intracranial hemorrhage suspected. Additional history provided: Diffuse headache with pain radiating into neck ongoing for days. EXAM: CT HEAD WITHOUT CONTRAST TECHNIQUE: Contiguous axial images were obtained from  the base of the skull through the vertex without intravenous contrast. COMPARISON:  Head CT 07/03/2017, brain MRI 11/27/2016 FINDINGS: Brain: There is no evidence of acute intracranial hemorrhage, intracranial mass, midline shift or extra-axial fluid collection.No demarcated cortical infarction. Cerebral volume is normal for age. Vascular: No hyperdense vessel.  Atherosclerotic calcifications Skull: Normal. Negative for fracture or focal lesion. Sinuses/Orbits: Visualized orbits demonstrate no acute abnormality. No significant paranasal sinus disease or mastoid effusion at the imaged levels. IMPRESSION: No evidence of acute intracranial abnormality. Specifically, no evidence of acute intracranial hemorrhage. Electronically Signed   By: Kellie Simmering DO   On: 06/29/2019 08:25   CT CHEST WO CONTRAST  Result Date: 06/30/2019 CLINICAL DATA:  54 year old female with history of cancer of unknown primary origin. EXAM: CT CHEST WITHOUT CONTRAST TECHNIQUE: Multidetector CT imaging of the chest was performed following the standard protocol without IV contrast. COMPARISON:  No prior chest CT. CT the abdomen and pelvis 06/28/2019. FINDINGS: Cardiovascular: Heart size is normal. There is no significant pericardial fluid, thickening or pericardial calcification. There is aortic atherosclerosis, as well as atherosclerosis of the great vessels of the mediastinum and the coronary arteries, including calcified atherosclerotic plaque in the left main and left anterior descending coronary arteries. Mediastinum/Nodes: Multiple prominent borderline enlarged mediastinal lymph nodes measuring up to 9 mm in short axis, nonspecific. Esophagus is unremarkable in appearance. No axillary lymphadenopathy. Lungs/Pleura: Multiple pulmonary nodules are again noted throughout the lungs bilaterally, concerning for widespread metastatic disease. These nodules generally range from 2-8 mm in size. In addition, at the right lung base in the right  lower lobe there is a mass-like area which appears related to direct invasion across the right hemidiaphragm from large liver mass, better demonstrated on prior CT the abdomen and pelvis 06/28/2019 (see that report for full details). Trace volume of right pleural fluid lying dependently. No  left pleural effusion. Upper Abdomen: Large mass in the right lobe of the liver poorly demonstrated on today's noncontrast CT examination. See report from contemporaneously obtained abdominal MRI 06/29/2019 for full description of findings beneath the diaphragm. Musculoskeletal: There are no aggressive appearing lytic or blastic lesions noted in the visualized portions of the skeleton. IMPRESSION: 1. Multiple pulmonary nodules highly concerning for widespread metastatic disease to the lungs. 2. Large right lobe of the liver mass with trans diaphragmatic invasion into the lower right hemithorax involving portions of the right lower lobe. 3. Aortic atherosclerosis, in addition to left main and left anterior descending coronary artery disease. Please note that although the presence of coronary artery calcium documents the presence of coronary artery disease, the severity of this disease and any potential stenosis cannot be assessed on this non-gated CT examination. Assessment for potential risk factor modification, dietary therapy or pharmacologic therapy may be warranted, if clinically indicated. Aortic Atherosclerosis (ICD10-I70.0). Electronically Signed   By: Vinnie Langton M.D.   On: 06/30/2019 12:48   MR ABDOMEN W WO CONTRAST  Result Date: 06/30/2019 CLINICAL DATA:  54 year old female with liver lesion noted on prior CT examination. Follow-up study. EXAM: MRI ABDOMEN WITHOUT AND WITH CONTRAST TECHNIQUE: Multiplanar multisequence MR imaging of the abdomen was performed both before and after the administration of intravenous contrast. CONTRAST:  5.47mL GADAVIST GADOBUTROL 1 MMOL/ML IV SOLN COMPARISON:  No prior abdominal  MRI. CT the abdomen and pelvis 06/28/2019. FINDINGS: Comment: Portions of today's examination are limited by extensive patient respiratory motion. Lower chest: Trace right pleural effusion lying dependently. Numerous small areas of increased signal intensity corresponding to pulmonary nodules noted throughout the visualize lung bases. Hepatobiliary: Liver has a slightly shrunken appearance and nodular contour, indicative of underlying cirrhosis. There is heterogeneous signal intensity throughout the liver, most evident in the right lobe of the liver where there are widespread somewhat ill-defined areas of mild T2 hyperintensity and T1 hypointensity. In the superior aspect of segment 7 of the liver there is a well-defined mass (axial image 9 of series 10 and coronal image 9 of series 4) measuring 4.4 x 4.4 x 4.1 cm, which demonstrates heterogeneous signal intensity on T1 and T2 weighted images, but generally has a peripheral rim of T2 hypointensity, and demonstrates heterogeneous internal enhancement on post gadolinium images without a specific enhancement pattern. Extending cephalad from this lesion there is additional irregular malignant appearing soft tissue which appears to directly invade the right hemidiaphragm best appreciated on coronal image 10 of series 4 and axial image 6 of series 5 where this is estimated to measure approximately 8.6 x 7.2 x 3.3 cm. This may also directly invade the inferior aspect of the right lower lobe, although this is difficult to say for certain on today's magnetic resonance examination. Notably, the ill-defined areas of T2 hyperintensity throughout the right lobe of the liver correspond to relatively diffuse arterial phase hyperenhancement on post gadolinium images which decreased on subsequent post gadolinium delayed imaging. Markedly heterogeneous perfusion is evident throughout the posterior aspect of the right lobe of the liver, predominantly segments 7 and 6, suggesting  widespread infiltrative poorly defined neoplasm in these regions (although some of this could be accounted for by the altered perfusion throughout this portion of the liver related to portal and hepatic venous obstruction). The right branches of the portal vein appear dilated and do not demonstrate normal portal venous enhancement, with abrupt truncation of the right main portal vein best appreciated on axial image 42 of  series 19), suggesting extensive tumor thrombus in the portal venous system of the right lobe of the liver. There is also incomplete opacification of the right hepatic vein (axial image 36 of series 19), suggesting additional tumor thrombus in the right hepatic vein. No intra or extrahepatic biliary ductal dilatation. Gallbladder is moderately distended with diffuse gallbladder wall edema, but no overt surrounding inflammatory changes. No filling defects in the gallbladder to suggest cholelithiasis. Pancreas: No pancreatic mass. No pancreatic ductal dilatation. No pancreatic or peripancreatic fluid collections or inflammatory changes. Spleen: Spleen appears borderline enlarged measuring 11.3 x 5.7 x 11.2 cm (estimated splenic volume of 361 mL) . Adrenals/Urinary Tract: Bilateral kidneys and adrenal glands are normal in appearance. No hydroureteronephrosis in the visualized portions of the abdomen. Stomach/Bowel: Visualized portions are unremarkable. Vascular/Lymphatic: No aneurysm identified in the visualized abdominal vasculature. Probable tumor thrombus in the right portal and hepatic venous systems, as detailed above. Numerous serpiginous densities adjacent to the proximal stomach and distal esophagus, compatible with varices. No definite lymphadenopathy confidently identified in the abdomen. Other: No significant volume of ascites noted in the visualized portions of the peritoneal cavity. Musculoskeletal: No aggressive appearing osseous lesions are noted in the visualized portions of the  skeleton. IMPRESSION: 1. Cirrhosis with probable diffusely infiltrative hepatocellular carcinoma throughout the right lobe of the liver, including a more well encapsulated area in segment 7 near the dome. This appears to extend out of the hepatic capsule invading the right hemidiaphragm which is enlarged, and potentially invading the lower right hemithorax with possible involvement of the inferior aspect of the right lower lobe. Trace right pleural effusion is likely malignant. 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 2. Multiple small pulmonary nodules scattered throughout the visualize lung bases compatible with widespread metastatic disease to the lungs, better demonstrated on chest CT 06/30/2019. 3. Moderate dilatation of the gallbladder with diffuse gallbladder wall edema. However, there are no gallstones and there is no pericholecystic fluid. This likely reflects gallbladder wall edema from intrinsic hepatic disease, and is not favored to indicate an acute cholecystitis at this time. 4. Borderline splenomegaly. 5. 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 Electronically Signed   By: Vinnie Langton M.D.   On: 06/30/2019 13:12   NM Bone Scan Whole Body  Result Date: 07/02/2019 CLINICAL DATA:  Unknown primary malignancy. EXAM: NUCLEAR MEDICINE WHOLE BODY BONE SCAN TECHNIQUE: Whole body anterior and posterior images were obtained approximately 3 hours after intravenous injection of radiopharmaceutical. RADIOPHARMACEUTICALS:  21.9 mCi Technetium-35m MDP IV COMPARISON:  None. FINDINGS: Degenerative changes are seen involving both shoulders. No other areas of abnormal uptake are noted. IMPRESSION: No definite scintigraphic evidence of osseous metastases. Electronically Signed   By: Marijo Conception M.D.   On: 07/02/2019 14:56   CT ABDOMEN PELVIS W CONTRAST  Result Date: 06/28/2019 CLINICAL DATA:  Right lower quadrant pain EXAM: CT ABDOMEN AND PELVIS WITH  CONTRAST TECHNIQUE: Multidetector CT imaging of the abdomen and pelvis was performed using the standard protocol following bolus administration of intravenous contrast. CONTRAST:  165mL OMNIPAQUE IOHEXOL 300 MG/ML  SOLN COMPARISON:  Ultrasound 06/28/2019 FINDINGS: Lower chest: Lung bases demonstrate numerous pulmonary nodules measuring up to 8 mm in size concerning for metastatic disease. Heart size within normal limits. Hepatobiliary: Heterogenous liver. Subtle contour nodularity suggesting cirrhosis. No calcified gallstone. Small amount of pericholecystic fluid or wall thickening though normal appearance of the gallbladder by sonography. Large heterogenous mass that appears to be within the posterior right  hepatic lobe. This measures approximately 8.2 x 6.9 by 8.8 cm and contains calcifications. The mass may invade the right diaphragm given its appearance on coronal views. Additional smaller mass measuring 2.9 cm, series 3, image number 28 more inferiorly within the right hepatic lobe. Suspect that there may be additional vague hypodense masses in the right lobe. Pancreas: Unremarkable. No pancreatic ductal dilatation or surrounding inflammatory changes. Spleen: Borderline enlarged. Adrenals/Urinary Tract: Left adrenal gland is normal. Questionable involvement of right adrenal gland by the hepatic mass lesion, series 3, image number 21. Kidneys show no hydronephrosis. The bladder is normal Stomach/Bowel: Stomach nonenlarged. No dilated small bowel. Diverticular disease of the colon. Possible mild wall thickening at the cecum and ascending colon. The appendix is negative. Vascular/Lymphatic: Moderate aortic atherosclerosis. No aneurysm. Small porta hepatis and gastrohepatic nodes measuring up to 16 mm in size, series 3, image number 30. Reproductive: Uterus and bilateral adnexa are unremarkable. Other: No free air. Small amount of ascites within the abdomen and pelvis. Diffuse hazy appearance of the mesentery.  Musculoskeletal: Bones appear slightly dense but are without focal lytic or focal sclerotic process. IMPRESSION: 1. Probable liver cirrhosis. Large heterogeneous suspicion mass measuring at least 8.8 cm, probably arising from the right hepatic lobe of the liver but extending cephalad toward the right lung base with possible violation of the capsule and invasion/involvement of the right diaphragm. At least 1 additional smaller solid mass in the inferior right hepatic lobe either representing metastatic disease or multifocal disease. Correlation with MRI should be considered. 2. Numerous pulmonary nodules consistent with metastatic disease 3. Possible gallbladder wall thickening or small amount of pericholecystic fluid as seen on recent ultrasound 4. Diffuse diverticular disease of the colon with possible wall thickening/mild acute inflammation at the cecum and ascending colon. 5. Small amount of free fluid in the pelvis. Electronically Signed   By: Donavan Foil M.D.   On: 06/28/2019 23:41   US BIOPSY (LIVER)  Result Date: 07/03/2019 CLINICAL DATA:  Right hepatic mass EXAM: ULTRASOUND-GUIDED CORE LIVER LESION BIOPSY TECHNIQUE: An ultrasound guided liver biopsy was thoroughly discussed with the patient and questions were answered. The benefits, risks, alternatives, and complications were also discussed. The patient understands and wishes to proceed with the procedure. A verbal as well as written consent was obtained. Survey ultrasound of the liver was performed, the lesion identified, and an appropriate skin entry site was determined. Skin site was marked, prepped with chlorhexidine, and draped in usual sterile fashion, and infiltrated locally with 1% lidocaine. Intravenous Fentanyl 172mcg and Versed 2mg  were administered as conscious sedation during continuous monitoring of the patient's level of consciousness and physiological / cardiorespiratory status by the radiology RN, with a total moderate sedation time of  11 minutes. A 17 gauge trocar needle was advanced under ultrasound guidance into the liver to the margin of the lesion. Multiple coaxial 18gauge core samples were then obtained through the guide needle. The guide needle was removed. Post procedure scans demonstrate no apparent complication. COMPLICATIONS: COMPLICATIONS None immediate FINDINGS: Echogenic posterior right hepatic lobe liver lesion was localized. Representative core biopsy samples obtained as above. IMPRESSION: 1. Technically successful ultrasound guided core liver lesion biopsy. Electronically Signed   By: Lucrezia Europe M.D.   On: 07/03/2019 11:18   DG Chest Port 1 View  Result Date: 06/28/2019 CLINICAL DATA:  Cough and shortness of breath. EXAM: PORTABLE CHEST 1 VIEW COMPARISON:  Aug 21, 2019 FINDINGS: The cardiac silhouette is enlarged. There is vascular congestion with developing pulmonary  edema. There are small bilateral pleural effusions. Aortic calcifications are noted. Bibasilar atelectasis is noted. There is no acute osseous abnormality. IMPRESSION: Findings suggestive of congestive heart failure. Electronically Signed   By: Constance Holster M.D.   On: 06/28/2019 21:43   ECHOCARDIOGRAM COMPLETE  Result Date: 06/30/2019    ECHOCARDIOGRAM REPORT   Patient Name:   Valerie [name] Date of Exam: 06/30/2019 Medical Rec #:  [id number]    Height:       62.0 in Accession #:    [id number]   Weight:       125.6 lb Date of Birth:  1965-12-19    BSA:          1.569 m Patient Age:    22 years     BP:           147/95 mmHg Patient Gender: F            HR:           71 bpm. Exam Location:  Inpatient Procedure: 2D Echo, Cardiac Doppler and Color Doppler Indications:    Pulmonary hypertension 416.8/I27.2  History:        Patient has no prior history of Echocardiogram examinations.                 Risk Factors:Current Smoker. Thrombocytopenia. Liver cirrhosis.                 SIRS. Hepatitis C. Marijuana abuse.  Sonographer:    Clayton Lefort RDCS (AE) Referring  Phys: [id number] [place]  Sonographer Comments: Patient movement. Echocardiogram ended before protocol complete due to patient stating she could not tolerate laying on back or left side. Nurse notified. IMPRESSIONS  1. Limited images, incomplete study.  2. Left ventricular ejection fraction, by estimation, is 60 to 65%. The left ventricle has normal function. The left ventricle has no regional wall motion abnormalities. Left ventricular diastolic function could not be evaluated.  3. Right ventricular systolic function is normal. The right ventricular size is normal. Tricuspid regurgitation signal is inadequate for assessing PA pressure.  4. The mitral valve is grossly normal. Trivial mitral valve regurgitation.  5. The aortic valve is tricuspid. Aortic valve regurgitation is not visualized. FINDINGS  Left Ventricle: Left ventricular ejection fraction, by estimation, is 60 to 65%. The left ventricle has normal function. The left ventricle has no regional wall motion abnormalities. The left ventricular internal cavity size was normal in size. There is  borderline left ventricular hypertrophy. Left ventricular diastolic function could not be evaluated. Right Ventricle: The right ventricular size is normal. No increase in right ventricular wall thickness. Right ventricular systolic function is normal. Tricuspid regurgitation signal is inadequate for assessing PA pressure. Left Atrium: Left atrial size was normal in size. Right Atrium: Right atrial size was normal in size. Pericardium: There is no evidence of pericardial effusion. Mitral Valve: The mitral valve is grossly normal. Trivial mitral valve regurgitation. Tricuspid Valve: The tricuspid valve is grossly normal. Tricuspid valve regurgitation is mild. Aortic Valve: The aortic valve is tricuspid. Aortic valve regurgitation is not visualized. Mild aortic valve annular calcification. Pulmonic Valve: The pulmonic valve was grossly normal. Pulmonic valve  regurgitation is mild. Aorta: The aortic root is normal in size and structure. Venous: The inferior vena cava was not well visualized. IAS/Shunts: No atrial level shunt detected by color flow Doppler.  LEFT VENTRICLE PLAX 2D LVIDd:         4.90 cm LVIDs:  3.10 cm LV PW:         1.10 cm LV IVS:        0.90 cm LVOT diam:     1.80 cm LVOT Area:     2.54 cm  LEFT ATRIUM         Index LA diam:    3.60 cm 2.29 cm/m   AORTA Ao Root diam: 2.60 cm Ao Asc diam:  3.20 cm TRICUSPID VALVE TR Peak grad:   22.1 mmHg TR Vmax:        235.00 cm/s  SHUNTS Systemic Diam: 1.80 cm Rozann Lesches MD Electronically signed by Rozann Lesches MD Signature Date/Time: 06/30/2019/11:31:13 AM    Final    US Abdomen Limited RUQ  Result Date: 06/28/2019 CLINICAL DATA:  Chest, right upper quadrant pain for 3 weeks, nausea, vomiting and fevers, history of cirrhosis EXAM: ULTRASOUND ABDOMEN LIMITED RIGHT UPPER QUADRANT COMPARISON:  None. FINDINGS: Gallbladder: Mildly contracted appearance of the gallbladder. No visible calcified gallstones, significant gallbladder wall thickening. Trace pericholecystic fluid. Sonographic Percell Miller sign is reportedly negative. Common bile duct: Diameter: 1.3 mm proximally, nondilated. Distal portion not visualized. Liver: There is a heterogeneous, centrally hyperechoic, peripherally hypoechoic lesion with a thin capsule in the right lobe liver measuring 3.9 x 4.0 x 4.6 cm. Coarsened heterogeneous background parenchymal echogenicity with a nodular hepatic surface contour compatible with history of cirrhosis. Portal vein is patent on color Doppler imaging with normal direction of blood flow towards the liver. Other: None. IMPRESSION: Under distension of the gallbladder. Trace pericholecystic fluid is nonspecific with absence of other features of acute cholecystitis. Should be considered within the appropriate clinical setting. Heterogeneous 4.6 cm mass in the posterior right lobe liver, highly concerning for  hepatocellular carcinoma given background of cirrhosis. Consider further characterization with multiphase MRI or CT, preferably on an outpatient basis when patient is better able to tolerate scanning procedure. Electronically Signed   By: Lovena Le M.D.   On: 06/28/2019 22:16

## 2019-07-12 NOTE — Progress Notes (Signed)
[place] CSW Progress Notes  Referred to [facility] for help w applying for disability.  [employer] [employer] Advocate, Toniann Ket, is assigned to help w Medicaid application - email sent requesting status update.  Edwyna Shell, LCSW Clinical Social Worker Phone:  [phone] Cell:  [phone]

## 2019-07-13 ENCOUNTER — Encounter (HOSPITAL_COMMUNITY): Payer: Self-pay | Admitting: Emergency Medicine

## 2019-07-13 ENCOUNTER — Emergency Department (HOSPITAL_COMMUNITY)
Admission: EM | Admit: 2019-07-13 | Discharge: 2019-07-14 | Disposition: A | Discharge status: Home / Self Care | Payer: Medicaid Other | Attending: Emergency Medicine | Admitting: Emergency Medicine

## 2019-07-13 ENCOUNTER — Other Ambulatory Visit: Payer: Self-pay

## 2019-07-13 DIAGNOSIS — K5900 Constipation, unspecified: Secondary | ICD-10-CM | POA: Diagnosis not present

## 2019-07-13 DIAGNOSIS — R509 Fever, unspecified: Secondary | ICD-10-CM | POA: Insufficient documentation

## 2019-07-13 DIAGNOSIS — Z5321 Procedure and treatment not carried out due to patient leaving prior to being seen by health care provider: Secondary | ICD-10-CM | POA: Insufficient documentation

## 2019-07-13 LAB — CBC WITH DIFFERENTIAL/PLATELET
Abs Immature Granulocytes: 0.02 10*3/uL (ref 0.00–0.07)
Basophils Absolute: 0 10*3/uL (ref 0.0–0.1)
Basophils Relative: 0 %
Eosinophils Absolute: 0.1 10*3/uL (ref 0.0–0.5)
Eosinophils Relative: 2 %
HCT: 32.8 % — ABNORMAL LOW (ref 36.0–46.0)
Hemoglobin: 10.9 g/dL — ABNORMAL LOW (ref 12.0–15.0)
Immature Granulocytes: 0 %
Lymphocytes Relative: 29 %
Lymphs Abs: 1.9 10*3/uL (ref 0.7–4.0)
MCH: 31.8 pg (ref 26.0–34.0)
MCHC: 33.2 g/dL (ref 30.0–36.0)
MCV: 95.6 fL (ref 80.0–100.0)
Monocytes Absolute: 0.7 10*3/uL (ref 0.1–1.0)
Monocytes Relative: 10 %
Neutro Abs: 3.9 10*3/uL (ref 1.7–7.7)
Neutrophils Relative %: 59 %
Platelets: 122 10*3/uL — ABNORMAL LOW (ref 150–400)
RBC: 3.43 MIL/uL — ABNORMAL LOW (ref 3.87–5.11)
RDW: 17.6 % — ABNORMAL HIGH (ref 11.5–15.5)
WBC: 6.7 10*3/uL (ref 4.0–10.5)
nRBC: 0 % (ref 0.0–0.2)

## 2019-07-13 LAB — COMPREHENSIVE METABOLIC PANEL
ALT: 69 U/L — ABNORMAL HIGH (ref 0–44)
AST: 341 U/L — ABNORMAL HIGH (ref 15–41)
Albumin: 2.7 g/dL — ABNORMAL LOW (ref 3.5–5.0)
Alkaline Phosphatase: 252 U/L — ABNORMAL HIGH (ref 38–126)
Anion gap: 9 (ref 5–15)
BUN: 10 mg/dL (ref 6–20)
CO2: 24 mmol/L (ref 22–32)
Calcium: 8.5 mg/dL — ABNORMAL LOW (ref 8.9–10.3)
Chloride: 102 mmol/L (ref 98–111)
Creatinine, Ser: 0.76 mg/dL (ref 0.44–1.00)
GFR calc Af Amer: >60 mL/min (ref >60)
GFR calc non Af Amer: >60 mL/min (ref >60)
Glucose, Bld: 108 mg/dL — ABNORMAL HIGH (ref 70–99)
Potassium: 4.3 mmol/L (ref 3.5–5.1)
Sodium: 135 mmol/L (ref 135–145)
Total Bilirubin: 2.1 mg/dL — ABNORMAL HIGH (ref 0.3–1.2)
Total Protein: 6.8 g/dL (ref 6.5–8.1)

## 2019-07-13 LAB — URINALYSIS, ROUTINE W REFLEX MICROSCOPIC
Bilirubin Urine: NEGATIVE
Glucose, UA: NEGATIVE mg/dL
Hgb urine dipstick: NEGATIVE
Ketones, ur: NEGATIVE mg/dL
Leukocytes,Ua: NEGATIVE
Nitrite: NEGATIVE
Protein, ur: NEGATIVE mg/dL
Specific Gravity, Urine: 1.021 (ref 1.005–1.030)
pH: 5 (ref 5.0–8.0)

## 2019-07-13 NOTE — ED Triage Notes (Signed)
Pt st's she had her first chemo tx yesterday and today she has had chills and fever.  Also c/o constipation.  Last BM yesterday

## 2019-07-14 NOTE — ED Notes (Addendum)
Pt stated she feels better and has decided to check out.

## 2019-07-15 ENCOUNTER — Telehealth: Payer: Self-pay | Admitting: *Deleted

## 2019-07-15 NOTE — Telephone Encounter (Signed)
Called pt to see how she was doing post treatment last week.  She is at the beach now& stated she had a temp of 100.3 on second day & went to ED but was never seen.  Her temp broke & hasn't had since.  She reported a couple of loose stools but not diarrhea & she reports occ labored breathing that improves with rest.  Informed to call if this is worse or she develops any other symptoms.  Noted she doesn't have a f/u appt but hopefully schedulers are working on this.  She expressed understanding. Message routed to Dr Sherrill/RN pod.

## 2019-07-15 NOTE — Telephone Encounter (Signed)
-----   Message from Rennis Harding, RN sent at 07/12/2019  3:49 PM EDT ----- Regarding: Dr. Benay Spice 1st time chemo follow up 1st time chemo follow up -  Pt tolerated well.

## 2019-07-15 NOTE — Telephone Encounter (Signed)
Called to f/u on status of her breast pain since addition of gabapentin 300 mg bid. She confirmed she is taking it and feels a lot better--expressed that she is surprised at how much better she feels. Tells RN that she is currently relaxing at the beach.

## 2019-07-17 ENCOUNTER — Other Ambulatory Visit: Payer: Self-pay

## 2019-07-17 ENCOUNTER — Telehealth: Payer: Self-pay | Admitting: Oncology

## 2019-07-17 ENCOUNTER — Other Ambulatory Visit: Payer: Self-pay | Admitting: Oncology

## 2019-07-17 NOTE — Telephone Encounter (Signed)
Scheduled per los. Called and spoke with patient. Confirmed appt 

## 2019-07-22 ENCOUNTER — Ambulatory Visit: Payer: Self-pay | Attending: Family Medicine | Admitting: Family Medicine

## 2019-07-22 ENCOUNTER — Ambulatory Visit: Payer: Medicaid Other | Admitting: Family Medicine

## 2019-07-22 ENCOUNTER — Other Ambulatory Visit: Payer: Self-pay

## 2019-07-22 DIAGNOSIS — I10 Essential (primary) hypertension: Secondary | ICD-10-CM

## 2019-07-22 DIAGNOSIS — K7460 Unspecified cirrhosis of liver: Secondary | ICD-10-CM

## 2019-07-22 DIAGNOSIS — C220 Liver cell carcinoma: Secondary | ICD-10-CM

## 2019-07-22 MED ORDER — METOPROLOL TARTRATE 25 MG PO TABS: 25.0000 mg | ORAL_TABLET | Freq: Two times a day (BID) | ORAL | 3 refills | Status: DC

## 2019-07-22 NOTE — Progress Notes (Signed)
Patient needs a letter to return to work.

## 2019-07-22 NOTE — Progress Notes (Signed)
Virtual Visit via Telephone Note  I connected with Valerie [name], on 07/22/2019 at 10:31 AM by telephone due to the COVID-19 pandemic and verified that I am speaking with the correct person using two identifiers.   Consent: I discussed the limitations, risks, security and privacy concerns of performing an evaluation and management service by telephone and the availability of in person appointments. I also discussed with the patient that there may be a patient responsible charge related to this service. The patient expressed understanding and agreed to proceed.   Location of Patient: Home  Location of Provider: Clinic   Persons participating in Telemedicine visit: Amiliana Foutz [place]-CMA Dr. Margarita Rana     History of Present Illness: Valerie [name] is a 54 year old female who presents to establish care.  Medical history significant for hepatitis C (completed treatment with Ribavirin), Cirrhosis, bipolar disorder, pseudoseizures, recently diagnosed hepatocellular carcinoma with pulmonary metastases diagnosed in 06/2019. She is currently followed by Oncology and is on Avastin and Tecentriq every 3 weeks.  Last visit with oncology was in 07/12/2019.  CT abdomen and pelvis revealed: IMPRESSION: 1. Probable liver cirrhosis. Large heterogeneous suspicion mass measuring at least 8.8 cm, probably arising from the right hepatic lobe of the liver but extending cephalad toward the right lung base with possible violation of the capsule and invasion/involvement of the right diaphragm. At least 1 additional smaller solid mass in the inferior right hepatic lobe either representing metastatic disease or multifocal disease. Correlation with MRI should be considered. 2. Numerous pulmonary nodules consistent with metastatic disease 3. Possible gallbladder wall thickening or small amount of pericholecystic fluid as seen on recent ultrasound 4. Diffuse diverticular disease of the colon with possible  wall thickening/mild acute inflammation at the cecum and ascending colon. 5. Small amount of free fluid in the pelvis.   She has pain in her 'liver' and is short of breath. Currently on Dilaudid for pain Her next treatment is on 08/02/19. Her appetite is horrible, she can only eat a little bit at a time  With regards to her Bipolar disorder she is not on medications. In the past was followed by a Psychiatrist in [place] and was on Seroquel, Lithium; she is not interested in seeking mental health care at this time but would like to focus on her hepatocellular carcinoma. She has been out of work since 06/28/19 and her Oncologist is working on trying to obtain disability for her.  Past Medical History:  Diagnosis Date  . Bipolar disorder ([place])   . Hepatitis C   . Liver cirrhosis ([place])   . Pseudoseizures    Allergies  Allergen Reactions  . Norco [Hydrocodone-Acetaminophen] Anaphylaxis and Shortness Of Breath  . Tylenol [Acetaminophen] Other (See Comments)    Cannot take due to liver    Current Outpatient Medications on File Prior to Visit  Medication Sig Dispense Refill  . FLUoxetine (PROZAC) 20 MG capsule Take 1 capsule (20 mg total) by mouth daily. 30 capsule 1  . gabapentin (NEURONTIN) 300 MG capsule Take 1 capsule (300 mg total) by mouth 3 (three) times daily. 90 capsule 5  . HYDROmorphone (DILAUDID) 2 MG tablet Take 1 tablet (2 mg total) by mouth every 4 (four) hours as needed for severe pain. 60 tablet 0  . ibuprofen (ADVIL) 600 MG tablet Take 1 tablet (600 mg total) by mouth every 6 (six) hours as needed. 30 tablet 0  . metoprolol tartrate (LOPRESSOR) 25 MG tablet Take 1 tablet (25 mg total) by  mouth 2 (two) times daily. 60 tablet 1  . prochlorperazine (COMPAZINE) 10 MG tablet Take 1 tablet (10 mg total) by mouth every 6 (six) hours as needed. 30 tablet 2  . methocarbamol (ROBAXIN) 500 MG tablet Take 1 tablet (500 mg total) by mouth 2 (two) times daily. (Patient not taking:  Reported on 07/22/2019) 20 tablet 0   No current facility-administered medications on file prior to visit.    Observations/Objective: Awake, alert, oriented x3 Not in acute distress  CMP Latest Ref Rng & Units 07/13/2019 07/04/2019 07/03/2019  Glucose 70 - 99 mg/dL 108(H) 127(H) 107(H)  BUN 6 - 20 mg/dL 10 7 <5(L)  Creatinine 0.44 - 1.00 mg/dL 0.76 0.57 0.61  Sodium 135 - 145 mmol/L 135 135 132(L)  Potassium 3.5 - 5.1 mmol/L 4.3 4.9 4.1  Chloride 98 - 111 mmol/L 102 103 101  CO2 22 - 32 mmol/L 24 24 24   Calcium 8.9 - 10.3 mg/dL 8.5(L) 8.4(L) 8.5(L)  Total Protein 6.5 - 8.1 g/dL 6.8 6.3(L) 6.7  Total Bilirubin 0.3 - 1.2 mg/dL 2.1(H) 1.8(H) 1.6(H)  Alkaline Phos 38 - 126 U/L 252(H) 206(H) 211(H)  AST 15 - 41 U/L 341(H) 173(H) 164(H)  ALT 0 - 44 U/L 69(H) 54(H) 56(H)    Lipid Panel  No results found for: CHOL, TRIG, HDL, CHOLHDL, VLDL, LDLCALC, LDLDIRECT, LABVLDL    Assessment and Plan: 1. Hepatocellular carcinoma ([place]) With pulmonary metastases Currently on Avastin and Tecentriq Pulmonary metastases could explain dyspnea and in addition she does have satiety Followed by oncology  2. Hepatic cirrhosis, unspecified hepatic cirrhosis type, unspecified whether ascites present ([place]) Secondary to hep C With resulting progression to [place]  3. Essential hypertension BP at last office visit was slightly elevated at 142/90 Counseled on blood pressure goal of less than 130/80, low-sodium, DASH diet, medication compliance, 150 minutes of moderate intensity exercise per week. Discussed medication compliance, adverse effects. - metoprolol tartrate (LOPRESSOR) 25 MG tablet; Take 1 tablet (25 mg total) by mouth 2 (two) times daily.  Dispense: 60 tablet; Refill: 3   Follow Up Instructions: 3 months for chronic disease management   I discussed the assessment and treatment plan with the patient. The patient was provided an opportunity to ask questions and all were answered. The patient agreed  with the plan and demonstrated an understanding of the instructions.   The patient was advised to call back or seek an in-person evaluation if the symptoms worsen or if the condition fails to improve as anticipated.     I provided 17 minutes total of non-face-to-face time during this encounter including median intraservice time, reviewing previous notes, investigations, ordering medications, medical decision making, coordinating care and patient verbalized understanding at the end of the visit.     Charlott Rakes, MD, FAAFP. [facility] [place], [place]   07/22/2019, 10:31 AM

## 2019-07-23 ENCOUNTER — Telehealth: Payer: Self-pay | Admitting: *Deleted

## 2019-07-23 NOTE — Telephone Encounter (Signed)
Left VM asking for office to confirm for her when she last worked and when MD determined her return to work date would be. Review of chart shows no documentation of this or any record of FMLA form completion. Called back on 4/13: She reports we never filled out any forms for her. Informed her that without forms there is no way we can determine her last day of work. She told nurse "never mind, my family will help me with this". She is trying to get assistance from [employer].

## 2019-07-25 ENCOUNTER — Encounter: Payer: Self-pay | Admitting: General Practice

## 2019-07-25 NOTE — Progress Notes (Signed)
[place] CSW Progress Notes  Call from patient - she has been told by landlord that she needs to pay bill in addition to her regular rent or she will be evicted.  She does not have the funds for this.  Referred to [employer] and [employer] for Housing and Press photographer for help w eviction prevention.  Patient also advised to call [employer] [place], contact number provided, for help w eviction proceedings.  She has also been notified by [facility] that they have submitted her application for Social Security disability benefits.  Edwyna Shell, LCSW Clinical Social Worker Phone:  [phone] Cell:  [phone]

## 2019-07-26 ENCOUNTER — Encounter: Payer: Self-pay | Admitting: Pharmacy Technician

## 2019-07-26 NOTE — Progress Notes (Signed)
Patient has been approved and is aware of drug assistance by [employer] for M[place]. The enrollment period is from 07/26/19-07/25/20 based on self pay. First DOS covered is 08/02/19.

## 2019-07-26 NOTE — Progress Notes (Signed)
Patient has been approved for drug assistance by [place] for Tecentriq & Avastin. The enrollment period is from 07/23/19- indefinetly based on self pay. First DOS covered is 08/02/19.

## 2019-07-28 ENCOUNTER — Other Ambulatory Visit: Payer: Self-pay | Admitting: Oncology

## 2019-07-29 NOTE — Progress Notes (Signed)
Pharmacist Chemotherapy Monitoring - Follow Up Assessment    I verify that I have reviewed each item in the below checklist:  . Regimen for the patient is scheduled for the appropriate day and plan matches scheduled date. Marland Kitchen Appropriate non-routine labs are ordered dependent on drug ordered. . If applicable, additional medications reviewed and ordered per protocol based on lifetime cumulative doses and/or treatment regimen.   Plan for follow-up and/or issues identified: No . I-vent associated with next due treatment: No . MD and/or nursing notified: No  Delane Ginger 07/29/2019 7:51 AM

## 2019-07-31 ENCOUNTER — Encounter: Payer: Self-pay | Admitting: Oncology

## 2019-07-31 ENCOUNTER — Encounter: Payer: Self-pay | Admitting: *Deleted

## 2019-07-31 NOTE — Progress Notes (Signed)
Patient called regarding Radio broadcast assistant.  Patient coming in tomorrow for appointment with me to bring income documentation.  She has my card for any additional financial questions or concerns.

## 2019-07-31 NOTE — Progress Notes (Signed)
[place] Work  Clinical Social Work received call from patient.  Patient provided update on housing concerns.  Patient has contacted legal aid regarding concerns with landlord, and has been told she will have a lawyer assigned in 1-3 weeks.  Patient has also contacted the city inspector, and they are coming today to inspect her home.  Patient had questions about the applying for the [place].  Patient stated she had Stefanie Libel card.  CSW encouraged patient to contact Shauna to get information needed to apply at her next appointment.    Johnnye Lana, MSW, LCSW, OSW-C Clinical Social Worker [facility] [phone]

## 2019-08-01 ENCOUNTER — Inpatient Hospital Stay: Payer: Medicaid Other

## 2019-08-01 ENCOUNTER — Other Ambulatory Visit: Payer: Self-pay

## 2019-08-01 ENCOUNTER — Encounter: Payer: Self-pay | Admitting: Oncology

## 2019-08-01 NOTE — Progress Notes (Signed)
Met with patient at registration whom brought proof of income for [employer].  Patient approved for one-time $1000 [employer]. She has a copy of the approval letter as well as the expense sheet along with Outpatient pharmacy information. Went over in detail expenses and how they are covered. She received a gas card today from the grant.  She has my card for any additional financial questions or concerns.

## 2019-08-02 ENCOUNTER — Telehealth: Payer: Self-pay | Admitting: Nurse Practitioner

## 2019-08-02 ENCOUNTER — Other Ambulatory Visit: Payer: Self-pay

## 2019-08-02 ENCOUNTER — Inpatient Hospital Stay (HOSPITAL_BASED_OUTPATIENT_CLINIC_OR_DEPARTMENT_OTHER): Payer: Medicaid Other | Admitting: Medical

## 2019-08-02 ENCOUNTER — Encounter: Payer: Self-pay | Admitting: Nurse Practitioner

## 2019-08-02 ENCOUNTER — Inpatient Hospital Stay: Payer: Medicaid Other

## 2019-08-02 ENCOUNTER — Inpatient Hospital Stay (HOSPITAL_BASED_OUTPATIENT_CLINIC_OR_DEPARTMENT_OTHER): Payer: Medicaid Other | Admitting: Nurse Practitioner

## 2019-08-02 VITALS — BP 157/89

## 2019-08-02 VITALS — BP 161/83 | HR 67 | Temp 99.1°F | Resp 17 | Ht 62.0 in | Wt 126.0 lb

## 2019-08-02 DIAGNOSIS — C220 Liver cell carcinoma: Secondary | ICD-10-CM

## 2019-08-02 DIAGNOSIS — Z5112 Encounter for antineoplastic immunotherapy: Secondary | ICD-10-CM | POA: Diagnosis not present

## 2019-08-02 LAB — CBC WITH DIFFERENTIAL/PLATELET
Abs Immature Granulocytes: 0.01 10*3/uL (ref 0.00–0.07)
Basophils Absolute: 0 10*3/uL (ref 0.0–0.1)
Basophils Relative: 0 %
Eosinophils Absolute: 0 10*3/uL (ref 0.0–0.5)
Eosinophils Relative: 1 %
HCT: 38.4 % (ref 36.0–46.0)
Hemoglobin: 12.4 g/dL (ref 12.0–15.0)
Immature Granulocytes: 0 %
Lymphocytes Relative: 38 %
Lymphs Abs: 1.7 10*3/uL (ref 0.7–4.0)
MCH: 31.7 pg (ref 26.0–34.0)
MCHC: 32.3 g/dL (ref 30.0–36.0)
MCV: 98.2 fL (ref 80.0–100.0)
Monocytes Absolute: 0.4 10*3/uL (ref 0.1–1.0)
Monocytes Relative: 8 %
Neutro Abs: 2.3 10*3/uL (ref 1.7–7.7)
Neutrophils Relative %: 53 %
Platelets: 76 10*3/uL — ABNORMAL LOW (ref 150–400)
RBC: 3.91 MIL/uL (ref 3.87–5.11)
RDW: 16.3 % — ABNORMAL HIGH (ref 11.5–15.5)
WBC: 4.5 10*3/uL (ref 4.0–10.5)
nRBC: 0 % (ref 0.0–0.2)

## 2019-08-02 LAB — URINALYSIS, COMPLETE (UACMP) WITH MICROSCOPIC
Bilirubin Urine: NEGATIVE
Glucose, UA: NEGATIVE mg/dL
Hgb urine dipstick: NEGATIVE
Ketones, ur: NEGATIVE mg/dL
Nitrite: NEGATIVE
Protein, ur: NEGATIVE mg/dL
Specific Gravity, Urine: 1.021 (ref 1.005–1.030)
pH: 6 (ref 5.0–8.0)

## 2019-08-02 LAB — COMPREHENSIVE METABOLIC PANEL
ALT: 107 U/L — ABNORMAL HIGH (ref 0–44)
AST: 184 U/L (ref 15–41)
Albumin: 2.9 g/dL — ABNORMAL LOW (ref 3.5–5.0)
Alkaline Phosphatase: 250 U/L — ABNORMAL HIGH (ref 38–126)
Anion gap: 9 (ref 5–15)
BUN: 8 mg/dL (ref 6–20)
CO2: 25 mmol/L (ref 22–32)
Calcium: 8.7 mg/dL — ABNORMAL LOW (ref 8.9–10.3)
Chloride: 108 mmol/L (ref 98–111)
Creatinine, Ser: 0.78 mg/dL (ref 0.44–1.00)
GFR calc Af Amer: >60 mL/min (ref >60)
GFR calc non Af Amer: >60 mL/min (ref >60)
Glucose, Bld: 85 mg/dL (ref 70–99)
Potassium: 3.8 mmol/L (ref 3.5–5.1)
Sodium: 142 mmol/L (ref 135–145)
Total Bilirubin: 2.2 mg/dL — ABNORMAL HIGH (ref 0.3–1.2)
Total Protein: 7.7 g/dL (ref 6.5–8.1)

## 2019-08-02 LAB — TSH: TSH: 0.98 u[IU]/mL (ref 0.308–3.960)

## 2019-08-02 LAB — TOTAL PROTEIN, URINE DIPSTICK: Protein, ur: NEGATIVE mg/dL

## 2019-08-02 MED ORDER — SODIUM CHLORIDE 0.9 % IV SOLN
15.0000 mg/kg | Freq: Once | INTRAVENOUS | Status: AC
  Administered 2019-08-02: 900 mg via INTRAVENOUS
  Filled 2019-08-02: qty 4

## 2019-08-02 MED ORDER — SODIUM CHLORIDE 0.9 % IV SOLN
1200.0000 mg | Freq: Once | INTRAVENOUS | Status: AC
  Administered 2019-08-02: 14:00:00 1200 mg via INTRAVENOUS
  Filled 2019-08-02: qty 20

## 2019-08-02 MED ORDER — SODIUM CHLORIDE 0.9 % IV SOLN
Freq: Once | INTRAVENOUS | Status: AC
  Filled 2019-08-02: qty 250

## 2019-08-02 MED ORDER — HYDROMORPHONE HCL 2 MG PO TABS: 2.0000 mg | ORAL_TABLET | ORAL | 0 refills | Status: DC | PRN

## 2019-08-02 NOTE — Progress Notes (Signed)
[place] OFFICE PROGRESS NOTE   Diagnosis: Hepatocellular carcinoma  INTERVAL HISTORY:   Valerie [name] returns as scheduled.  She completed cycle 1 atezolizumab/Avastin 07/12/2019.  She is seen today prior to cycle 2.  No nausea at present.  No rash.  No diarrhea.  Bowel habits have been somewhat erratic.  She has to strain to have a bowel movement.  She is having difficulty initiating the urine stream, dysuria.  No fever.  She notes a headache after taking a nausea medication.  Periodic dizziness.  She thinks she may have seen a small amount of blood with a bowel movement, no other bleeding.  Intermittent pain at the low back and right abdomen.  She is taking Dilaudid 2 mg on average twice a day.  She notes that the Dilaudid makes her sleepy.  Her daughter has noted some hallucinations after she takes a Dilaudid tablet.  Objective:  Vital signs in last 24 hours:  Blood pressure (!) 161/83, pulse 67, temperature 99.1 F (37.3 C), temperature source Temporal, resp. rate 17, height 5\' 2"  (1.575 m), weight 126 lb (57.2 kg), SpO2 100 %.    HEENT: No thrush or ulcers. Resp: Lungs clear bilaterally. Cardio: Regular rate and rhythm. GI: Abdomen is soft.  Tender at the upper abdomen and very low abdomen.  No hepatomegaly. Vascular: No leg edema. Neuro: Alert and oriented. Skin: No rash.   Lab Results:  Lab Results  Component Value Date   WBC 4.5 08/02/2019   HGB 12.4 08/02/2019   HCT 38.4 08/02/2019   MCV 98.2 08/02/2019   PLT 76 (L) 08/02/2019   NEUTROABS 2.3 08/02/2019    Imaging:  No results found.  Medications: I have reviewed the patient's current medications.  Assessment/Plan: 1. Liver and lung lesions concerning for metastatic cancer, ?[place] primary -CT 06/28/2019-probable cirrhosis, large heterogenous mass in the right liver extending to the right lung base with possible violation of the capsule and invasion of the right hemidiaphragm, at least 1 additional  mass in the inferior right liver -06/29/2019 MRI of the abdomen-probable diffusely infiltrative hepatocellular carcinoma throughout the right lobe of the liver which appears to extend out of the hepatic capsule and invade the right hemidiaphragm, tumor thrombus involving the right portal and hepatic veins, esophageal and proximal gastric varices -06/30/2019 CT of the chest without contrast-multiple pulmonary nodules concerning for widespread metastatic disease to the lungs, large right lobe liver mass with transdiaphragmatic invasion into the lower right hemithorax involving portions of the right lower lobe -07/01/2019 -AFP 267,124 -Cycle 1 atezolizumab/bevacizumab 07/12/2019 2. Hepatitis C -Reports prior treatment with 16 weeks of interferon ribavirin -06/29/2019 HCV RNA quantitative 634,000 3. Cirrhosis 4. Pseudoseizures 5. Bipolar disorder 6. Anemia and thrombocytopenia 7. Elevated LFTs  Disposition: [name] appears stable.  She has completed 1 cycle of atezolizumab/bevacizumab. Plan to proceed with cycle 2 today as scheduled.  I discussed the CBC and chemistry panel results with Dr. Benay Spice and [place] pharmacist.  Labs look okay to proceed with treatment.  Liver function tests overall are stable.  The platelet count is a little lower than her baseline likely due to cirrhosis.  We will have her return in 7 to 10 days for a repeat CBC and chemistry panel.  She will contact the office with any bleeding.  The erratic bowel habits may be related to pain medication usage.  She will begin Senokot S 1 tablet twice daily.  We are checking a urinalysis and culture today to evaluate dysuria.  She will return for repeat labs in 7 to 10 days.  We will see her in follow-up prior to treatment in 3 weeks.  She will contact the office in the interim with any problems.    Ned Card ANP/GNP-BC   08/02/2019  12:33 PM

## 2019-08-02 NOTE — Telephone Encounter (Signed)
Scheduled appt per 4/23 los - pt to get an updated schedule in treatment area.

## 2019-08-02 NOTE — Progress Notes (Signed)
IV began to leak at the the end of Bevacizumab infusion. Per Sandi Mealy, Ok to remove IV without flushing

## 2019-08-02 NOTE — Patient Instructions (Signed)
[facility] Discharge Instructions for Patients Receiving Chemotherapy  Today you received the following chemotherapy agents: Atexolizumab, and Bevacizumab.   To help prevent nausea and vomiting after your treatment, we encourage you to take your nausea medication as directed.  If you develop nausea and vomiting that is not controlled by your nausea medication, call the clinic.   BELOW ARE SYMPTOMS THAT SHOULD BE REPORTED IMMEDIATELY:  *FEVER GREATER THAN 100.5 F  *CHILLS WITH OR WITHOUT FEVER  NAUSEA AND VOMITING THAT IS NOT CONTROLLED WITH YOUR NAUSEA MEDICATION  *UNUSUAL SHORTNESS OF BREATH  *UNUSUAL BRUISING OR BLEEDING  TENDERNESS IN MOUTH AND THROAT WITH OR WITHOUT PRESENCE OF ULCERS  *URINARY PROBLEMS  *BOWEL PROBLEMS  UNUSUAL RASH Items with * indicate a potential emergency and should be followed up as soon as possible.  Feel free to call the clinic should you have any questions or concerns. The clinic phone number is [phone].  Please show the [facility] at check-in to the Emergency Department and triage nurse.  Atezolizumab injection What is this medicine? ATEZOLIZUMAB (a te zoe LIZ ue mab) is a monoclonal antibody. It is used to treat bladder cancer (urothelial cancer), liver cancer, lung cancer, breast cancer, and melanoma. This medicine may be used for other purposes; ask your health care provider or pharmacist if you have questions. COMMON BRAND NAME(S): Tecentriq What should I tell my health care provider before I take this medicine? They need to know if you have any of these conditions:  diabetes  immune system problems  infection  inflammatory bowel disease  liver disease  lung or breathing disease  lupus  nervous system problems like myasthenia gravis or Guillain-Barre syndrome  organ transplant  an unusual or allergic reaction to atezolizumab, other medicines, foods, dyes, or preservatives  pregnant or trying  to get pregnant  breast-feeding How should I use this medicine? This medicine is for infusion into a vein. It is given by a health care professional in a hospital or clinic setting. A special MedGuide will be given to you before each treatment. Be sure to read this information carefully each time. Talk to your pediatrician regarding the use of this medicine in children. Special care may be needed. Overdosage: If you think you have taken too much of this medicine contact a poison control center or emergency room at once. NOTE: This medicine is only for you. Do not share this medicine with others. What if I miss a dose? It is important not to miss your dose. Call your doctor or health care professional if you are unable to keep an appointment. What may interact with this medicine? Interactions have not been studied. This list may not describe all possible interactions. Give your health care provider a list of all the medicines, herbs, non-prescription drugs, or dietary supplements you use. Also tell them if you smoke, drink alcohol, or use illegal drugs. Some items may interact with your medicine. What should I watch for while using this medicine? Your condition will be monitored carefully while you are receiving this medicine. You may need blood work done while you are taking this medicine. Do not become pregnant while taking this medicine or for at least 5 months after stopping it. Women should inform their doctor if they wish to become pregnant or think they might be pregnant. There is a potential for serious side effects to an unborn child. Talk to your health care professional or pharmacist for more information. Do not breast-feed an  infant while taking this medicine or for at least 5 months after the last dose. What side effects may I notice from receiving this medicine? Side effects that you should report to your doctor or health care professional as soon as possible:  allergic reactions  like skin rash, itching or hives, swelling of the face, lips, or tongue  black, tarry stools  bloody or watery diarrhea  breathing problems  changes in vision  chest pain or chest tightness  chills  facial flushing  fever  headache  signs and symptoms of high blood sugar such as dizziness; dry mouth; dry skin; fruity breath; nausea; stomach pain; increased hunger or thirst; increased urination  signs and symptoms of liver injury like dark yellow or brown urine; general ill feeling or flu-like symptoms; light-colored stools; loss of appetite; nausea; right upper belly pain; unusually weak or tired; yellowing of the eyes or skin  stomach pain  trouble passing urine or change in the amount of urine Side effects that usually do not require medical attention (report to your doctor or health care professional if they continue or are bothersome):  bone pain  cough  diarrhea  joint pain  muscle pain  muscle weakness  swelling of arms or legs  tiredness  weight loss This list may not describe all possible side effects. Call your doctor for medical advice about side effects. You may report side effects to FDA at 1-800-FDA-1088. Where should I keep my medicine? This drug is given in a hospital or clinic and will not be stored at home. NOTE: This sheet is a summary. It may not cover all possible information. If you have questions about this medicine, talk to your doctor, pharmacist, or health care provider.  2020 Elsevier/Gold Standard (2018-11-16 13:11:14)  Bevacizumab injection What is this medicine? BEVACIZUMAB (be va SIZ yoo mab) is a monoclonal antibody. It is used to treat many types of cancer. This medicine may be used for other purposes; ask your health care provider or pharmacist if you have questions. COMMON BRAND NAME(S): Avastin, MVASI, Zirabev What should I tell my health care provider before I take this medicine? They need to know if you have any of these  conditions:  diabetes  heart disease  high blood pressure  history of coughing up blood  prior anthracycline chemotherapy (e.g., doxorubicin, daunorubicin, epirubicin)  recent or ongoing radiation therapy  recent or planning to have surgery  stroke  an unusual or allergic reaction to bevacizumab, hamster proteins, mouse proteins, other medicines, foods, dyes, or preservatives  pregnant or trying to get pregnant  breast-feeding How should I use this medicine? This medicine is for infusion into a vein. It is given by a health care professional in a hospital or clinic setting. Talk to your pediatrician regarding the use of this medicine in children. Special care may be needed. Overdosage: If you think you have taken too much of this medicine contact a poison control center or emergency room at once. NOTE: This medicine is only for you. Do not share this medicine with others. What if I miss a dose? It is important not to miss your dose. Call your doctor or health care professional if you are unable to keep an appointment. What may interact with this medicine? Interactions are not expected. This list may not describe all possible interactions. Give your health care provider a list of all the medicines, herbs, non-prescription drugs, or dietary supplements you use. Also tell them if you smoke, drink alcohol, or  use illegal drugs. Some items may interact with your medicine. What should I watch for while using this medicine? Your condition will be monitored carefully while you are receiving this medicine. You will need important blood work and urine testing done while you are taking this medicine. This medicine may increase your risk to bruise or bleed. Call your doctor or health care professional if you notice any unusual bleeding. Before having surgery, talk to your health care provider to make sure it is ok. This drug can increase the risk of poor healing of your surgical site or  wound. You will need to stop this drug for 28 days before surgery. After surgery, wait at least 28 days before restarting this drug. Make sure the surgical site or wound is healed enough before restarting this drug. Talk to your health care provider if questions. Do not become pregnant while taking this medicine or for 6 months after stopping it. Women should inform their doctor if they wish to become pregnant or think they might be pregnant. There is a potential for serious side effects to an unborn child. Talk to your health care professional or pharmacist for more information. Do not breast-feed an infant while taking this medicine and for 6 months after the last dose. This medicine has caused ovarian failure in some women. This medicine may interfere with the ability to have a child. You should talk to your doctor or health care professional if you are concerned about your fertility. What side effects may I notice from receiving this medicine? Side effects that you should report to your doctor or health care professional as soon as possible:  allergic reactions like skin rash, itching or hives, swelling of the face, lips, or tongue  chest pain or chest tightness  chills  coughing up blood  high fever  seizures  severe constipation  signs and symptoms of bleeding such as bloody or black, tarry stools; red or dark-brown urine; spitting up blood or brown material that looks like coffee grounds; red spots on the skin; unusual bruising or bleeding from the eye, gums, or nose  signs and symptoms of a blood clot such as breathing problems; chest pain; severe, sudden headache; pain, swelling, warmth in the leg  signs and symptoms of a stroke like changes in vision; confusion; trouble speaking or understanding; severe headaches; sudden numbness or weakness of the face, arm or leg; trouble walking; dizziness; loss of balance or coordination  stomach pain  sweating  swelling of legs or  ankles  vomiting  weight gain Side effects that usually do not require medical attention (report to your doctor or health care professional if they continue or are bothersome):  back pain  changes in taste  decreased appetite  dry skin  nausea  tiredness This list may not describe all possible side effects. Call your doctor for medical advice about side effects. You may report side effects to FDA at 1-800-FDA-1088. Where should I keep my medicine? This drug is given in a hospital or clinic and will not be stored at home. NOTE: This sheet is a summary. It may not cover all possible information. If you have questions about this medicine, talk to your doctor, pharmacist, or health care provider.  2020 Elsevier/Gold Standard (2019-01-23 10:50:46)

## 2019-08-02 NOTE — Progress Notes (Signed)
This patient was seen in the infusion room per the request of Ned Card, NP.  She had ordered a urinalysis on this patient which returned negative.  She was concerned the patient might be having neurologic findings.  Her exam showed that her lower extremity strength was 5/5 bilaterally with no deficits noted.  She was told to begin senna S, 1-2 p.o. twice daily as needed for constipation.  She was also told that her bladder irritation could be secondary to her bilirubin and other byproducts from her liver that are now being excluded the right kidney.  Ms. Marcello Moores also suggested that her dysuria could be secondary to her narcotic use for pain.  Patient expressed understanding with this discussion.  She will return as scheduled or sooner if needed.  Sandi Mealy, MHS, PA-C Physician Assistant

## 2019-08-03 LAB — URINE CULTURE: Culture: NO GROWTH

## 2019-08-05 ENCOUNTER — Telehealth: Payer: Self-pay | Admitting: *Deleted

## 2019-08-05 NOTE — Telephone Encounter (Signed)
Patient had called [facility] on 08/02/19 at 7:30pm with reports of burning pain in back; blood in stool and abdominal pain. Says her pain is rated 9.5/10. Also stated she is only able to void few drops in last 4 hours. Earlier that day she was instructed to take SennaS 1-2 bid for constipation. Was instructed by [facility] to go to emergency room. Do not see an ER note in [facility] system. Attempted to call patient to f/u--had to leave voice mail.

## 2019-08-09 ENCOUNTER — Other Ambulatory Visit: Payer: Self-pay

## 2019-08-09 ENCOUNTER — Inpatient Hospital Stay: Payer: Medicaid Other

## 2019-08-09 DIAGNOSIS — C220 Liver cell carcinoma: Secondary | ICD-10-CM

## 2019-08-09 DIAGNOSIS — Z5112 Encounter for antineoplastic immunotherapy: Secondary | ICD-10-CM | POA: Diagnosis not present

## 2019-08-09 LAB — CBC WITH DIFFERENTIAL (CANCER CENTER ONLY)
Abs Immature Granulocytes: 0.01 10*3/uL (ref 0.00–0.07)
Basophils Absolute: 0 10*3/uL (ref 0.0–0.1)
Basophils Relative: 0 %
Eosinophils Absolute: 0.1 10*3/uL (ref 0.0–0.5)
Eosinophils Relative: 3 %
HCT: 35.1 % — ABNORMAL LOW (ref 36.0–46.0)
Hemoglobin: 11.3 g/dL — ABNORMAL LOW (ref 12.0–15.0)
Immature Granulocytes: 0 %
Lymphocytes Relative: 43 %
Lymphs Abs: 1.5 10*3/uL (ref 0.7–4.0)
MCH: 31.7 pg (ref 26.0–34.0)
MCHC: 32.2 g/dL (ref 30.0–36.0)
MCV: 98.3 fL (ref 80.0–100.0)
Monocytes Absolute: 0.4 10*3/uL (ref 0.1–1.0)
Monocytes Relative: 12 %
Neutro Abs: 1.4 10*3/uL — ABNORMAL LOW (ref 1.7–7.7)
Neutrophils Relative %: 42 %
Platelet Count: 65 10*3/uL — ABNORMAL LOW (ref 150–400)
RBC: 3.57 MIL/uL — ABNORMAL LOW (ref 3.87–5.11)
RDW: 15.1 % (ref 11.5–15.5)
WBC Count: 3.4 10*3/uL — ABNORMAL LOW (ref 4.0–10.5)
nRBC: 0 % (ref 0.0–0.2)

## 2019-08-09 LAB — CMP (CANCER CENTER ONLY)
ALT: 76 U/L — ABNORMAL HIGH (ref 0–44)
AST: 126 U/L — ABNORMAL HIGH (ref 15–41)
Albumin: 2.6 g/dL — ABNORMAL LOW (ref 3.5–5.0)
Alkaline Phosphatase: 188 U/L — ABNORMAL HIGH (ref 38–126)
Anion gap: 6 (ref 5–15)
BUN: 8 mg/dL (ref 6–20)
CO2: 26 mmol/L (ref 22–32)
Calcium: 8.1 mg/dL — ABNORMAL LOW (ref 8.9–10.3)
Chloride: 108 mmol/L (ref 98–111)
Creatinine: 0.75 mg/dL (ref 0.44–1.00)
GFR, Est AFR Am: >60 mL/min (ref >60)
GFR, Estimated: >60 mL/min (ref >60)
Glucose, Bld: 113 mg/dL — ABNORMAL HIGH (ref 70–99)
Potassium: 3.6 mmol/L (ref 3.5–5.1)
Sodium: 140 mmol/L (ref 135–145)
Total Bilirubin: 1.6 mg/dL — ABNORMAL HIGH (ref 0.3–1.2)
Total Protein: 7.1 g/dL (ref 6.5–8.1)

## 2019-08-16 NOTE — Progress Notes (Signed)
Pharmacist Chemotherapy Monitoring - Follow Up Assessment    I verify that I have reviewed each item in the below checklist:  . Regimen for the patient is scheduled for the appropriate day and plan matches scheduled date. Marland Kitchen Appropriate non-routine labs are ordered dependent on drug ordered. . If applicable, additional medications reviewed and ordered per protocol based on lifetime cumulative doses and/or treatment regimen.   Plan for follow-up and/or issues identified: No . I-vent associated with next due treatment: No . MD and/or nursing notified: No  Anmol Fleck D 08/16/2019 11:58 AM

## 2019-08-17 ENCOUNTER — Other Ambulatory Visit: Payer: Self-pay | Admitting: Oncology

## 2019-08-22 ENCOUNTER — Inpatient Hospital Stay: Payer: Medicaid Other

## 2019-08-22 ENCOUNTER — Other Ambulatory Visit: Payer: Self-pay

## 2019-08-22 ENCOUNTER — Inpatient Hospital Stay: Payer: Medicaid Other | Attending: Oncology | Admitting: Nurse Practitioner

## 2019-08-22 ENCOUNTER — Encounter: Payer: Self-pay | Admitting: Nurse Practitioner

## 2019-08-22 VITALS — BP 150/92 | HR 75 | Temp 98.7°F | Resp 17 | Ht 62.0 in | Wt 123.1 lb

## 2019-08-22 DIAGNOSIS — C7801 Secondary malignant neoplasm of right lung: Secondary | ICD-10-CM | POA: Insufficient documentation

## 2019-08-22 DIAGNOSIS — D696 Thrombocytopenia, unspecified: Secondary | ICD-10-CM | POA: Diagnosis not present

## 2019-08-22 DIAGNOSIS — D709 Neutropenia, unspecified: Secondary | ICD-10-CM | POA: Insufficient documentation

## 2019-08-22 DIAGNOSIS — K7460 Unspecified cirrhosis of liver: Secondary | ICD-10-CM | POA: Diagnosis not present

## 2019-08-22 DIAGNOSIS — C7802 Secondary malignant neoplasm of left lung: Secondary | ICD-10-CM | POA: Diagnosis not present

## 2019-08-22 DIAGNOSIS — C220 Liver cell carcinoma: Secondary | ICD-10-CM

## 2019-08-22 DIAGNOSIS — Z5111 Encounter for antineoplastic chemotherapy: Secondary | ICD-10-CM | POA: Insufficient documentation

## 2019-08-22 LAB — CMP (CANCER CENTER ONLY)
ALT: 55 U/L — ABNORMAL HIGH (ref 0–44)
AST: 92 U/L — ABNORMAL HIGH (ref 15–41)
Albumin: 2.6 g/dL — ABNORMAL LOW (ref 3.5–5.0)
Alkaline Phosphatase: 163 U/L — ABNORMAL HIGH (ref 38–126)
Anion gap: 6 (ref 5–15)
BUN: 7 mg/dL (ref 6–20)
CO2: 26 mmol/L (ref 22–32)
Calcium: 8.2 mg/dL — ABNORMAL LOW (ref 8.9–10.3)
Chloride: 108 mmol/L (ref 98–111)
Creatinine: 0.72 mg/dL (ref 0.44–1.00)
GFR, Est AFR Am: >60 mL/min (ref >60)
GFR, Estimated: >60 mL/min (ref >60)
Glucose, Bld: 83 mg/dL (ref 70–99)
Potassium: 3.8 mmol/L (ref 3.5–5.1)
Sodium: 140 mmol/L (ref 135–145)
Total Bilirubin: 1.4 mg/dL — ABNORMAL HIGH (ref 0.3–1.2)
Total Protein: 6.8 g/dL (ref 6.5–8.1)

## 2019-08-22 LAB — CBC WITH DIFFERENTIAL (CANCER CENTER ONLY)
Abs Immature Granulocytes: 0.01 10*3/uL (ref 0.00–0.07)
Basophils Absolute: 0 10*3/uL (ref 0.0–0.1)
Basophils Relative: 1 %
Eosinophils Absolute: 0.1 10*3/uL (ref 0.0–0.5)
Eosinophils Relative: 4 %
HCT: 36 % (ref 36.0–46.0)
Hemoglobin: 11.6 g/dL — ABNORMAL LOW (ref 12.0–15.0)
Immature Granulocytes: 0 %
Lymphocytes Relative: 47 %
Lymphs Abs: 1.4 10*3/uL (ref 0.7–4.0)
MCH: 31.7 pg (ref 26.0–34.0)
MCHC: 32.2 g/dL (ref 30.0–36.0)
MCV: 98.4 fL (ref 80.0–100.0)
Monocytes Absolute: 0.3 10*3/uL (ref 0.1–1.0)
Monocytes Relative: 9 %
Neutro Abs: 1.2 10*3/uL — ABNORMAL LOW (ref 1.7–7.7)
Neutrophils Relative %: 39 %
Platelet Count: 44 10*3/uL — ABNORMAL LOW (ref 150–400)
RBC: 3.66 MIL/uL — ABNORMAL LOW (ref 3.87–5.11)
RDW: 14.2 % (ref 11.5–15.5)
WBC Count: 3 10*3/uL — ABNORMAL LOW (ref 4.0–10.5)
nRBC: 0 % (ref 0.0–0.2)

## 2019-08-22 LAB — TOTAL PROTEIN, URINE DIPSTICK: Protein, ur: <30 mg/dL

## 2019-08-22 MED ORDER — SODIUM CHLORIDE 0.9 % IV SOLN
15.0000 mg/kg | Freq: Once | INTRAVENOUS | Status: AC
  Administered 2019-08-22: 900 mg via INTRAVENOUS
  Filled 2019-08-22: qty 32

## 2019-08-22 MED ORDER — HEPARIN SOD (PORK) LOCK FLUSH 100 UNIT/ML IV SOLN
500.0000 [IU] | Freq: Once | INTRAVENOUS | Status: DC | PRN
  Filled 2019-08-22: qty 5

## 2019-08-22 MED ORDER — SODIUM CHLORIDE 0.9 % IV SOLN
1200.0000 mg | Freq: Once | INTRAVENOUS | Status: AC
  Administered 2019-08-22: 1200 mg via INTRAVENOUS
  Filled 2019-08-22: qty 20

## 2019-08-22 MED ORDER — SODIUM CHLORIDE 0.9% FLUSH
10.0000 mL | INTRAVENOUS | Status: DC | PRN
  Filled 2019-08-22: qty 10

## 2019-08-22 MED ORDER — SODIUM CHLORIDE 0.9 % IV SOLN
Freq: Once | INTRAVENOUS | Status: AC
  Filled 2019-08-22: qty 250

## 2019-08-22 NOTE — Progress Notes (Signed)
Per Ned Card NP, ok to treat with today's platelet count, ANC, and AST.

## 2019-08-22 NOTE — Patient Instructions (Signed)
[facility] Discharge Instructions for Patients Receiving Chemotherapy  Today you received the following chemotherapy agents: atezolizumab and bevacizumab.  To help prevent nausea and vomiting after your treatment, we encourage you to take your nausea medication as directed.   If you develop nausea and vomiting that is not controlled by your nausea medication, call the clinic.   BELOW ARE SYMPTOMS THAT SHOULD BE REPORTED IMMEDIATELY:  *FEVER GREATER THAN 100.5 F  *CHILLS WITH OR WITHOUT FEVER  NAUSEA AND VOMITING THAT IS NOT CONTROLLED WITH YOUR NAUSEA MEDICATION  *UNUSUAL SHORTNESS OF BREATH  *UNUSUAL BRUISING OR BLEEDING  TENDERNESS IN MOUTH AND THROAT WITH OR WITHOUT PRESENCE OF ULCERS  *URINARY PROBLEMS  *BOWEL PROBLEMS  UNUSUAL RASH Items with * indicate a potential emergency and should be followed up as soon as possible.  Feel free to call the clinic should you have any questions or concerns. The clinic phone number is [phone].  Please show the [facility] at check-in to the Emergency Department and triage nurse.

## 2019-08-22 NOTE — Progress Notes (Addendum)
  [facility] OFFICE PROGRESS NOTE   Diagnosis: Hepatocellular carcinoma  INTERVAL HISTORY:   Valerie [name] returns as scheduled.  She completed cycle 2 atezolizumab/Avastin 08/02/2019.  She denies nausea/vomiting.  No mouth sores.  No rash.  No diarrhea.  She reports abdominal pain overall is well controlled with Dilaudid.  No bleeding except a small amount occasionally on the toilet tissue after straining for a bowel movement.  Objective:  Vital signs in last 24 hours:  Blood pressure (!) 150/92, pulse 75, temperature 98.7 F (37.1 C), temperature source Temporal, resp. rate 17, height 5\' 2"  (1.575 m), weight 123 lb 1.6 oz (55.8 kg), SpO2 100 %.    HEENT: Mild white coating over tongue. Resp: Breath sounds diminished at the right lower lung field.  No respiratory distress. Cardio: Regular rate and rhythm. GI: Abdomen is soft with generalized tenderness.  No hepatomegaly. Vascular: No leg edema. Neuro: Alert and oriented. Skin: No rash.   Lab Results:  Lab Results  Component Value Date   WBC 3.0 (L) 08/22/2019   HGB 11.6 (L) 08/22/2019   HCT 36.0 08/22/2019   MCV 98.4 08/22/2019   PLT 44 (L) 08/22/2019   NEUTROABS 1.2 (L) 08/22/2019    Imaging:  No results found.  Medications: I have reviewed the patient's current medications.  Assessment/Plan: 1. Liver and lung lesions concerning for metastatic cancer, ?[facility] primary -CT 06/28/2019-probable cirrhosis, large heterogenous mass in the right liver extending to the right lung base with possible violation of the capsule and invasion of the right hemidiaphragm, at least 1 additional mass in the inferior right liver -06/29/2019 MRI of the abdomen-probable diffusely infiltrative hepatocellular carcinoma throughout the right lobe of the liver which appears to extend out of the hepatic capsule and invade the right hemidiaphragm, tumor thrombus involving the right portal and hepatic veins, esophageal and proximal  gastric varices -06/30/2019 CT of the chest without contrast-multiple pulmonary nodules concerning for widespread metastatic disease to the lungs, large right lobe liver mass with transdiaphragmatic invasion into the lower right hemithorax involving portions of the right lower lobe -07/01/2019 -AFP 765,465 -Cycle 1 atezolizumab/bevacizumab 07/12/2019 -Cycle 2 atezolizumab/bevacizumab 08/02/2019 -Cycle 3 atezolizumab/bevacizumab 08/22/2019 2. Hepatitis C -Reports prior treatment with 16 weeks of interferon ribavirin -06/29/2019 HCV RNA quantitative 634,000 3. Cirrhosis 4. Pseudoseizures 5. Bipolar disorder 6. Anemia and thrombocytopenia 7. Elevated LFTs  Disposition: Valerie [name] appears stable.  She has completed 2 cycles of atezolizumab/bevacizumab.  Plan to proceed with cycle 3 today as scheduled.  She will undergo restaging CTs after completing 4 cycles.  We reviewed the CBC from today.  Counts adequate to proceed with treatment.  She has progressive thrombocytopenia and mild neutropenia likely due to cirrhosis.  The chemistry panel is pending.  She will return for lab, follow-up, cycle 4 atezolizumab/bevacizumab in 3 weeks.  She will contact the office in the interim with any problems.  We specifically discussed fever, chills, other signs of infection, bleeding.  Patient seen with Dr. Benay Spice.    Ned Card ANP/GNP-BC   08/22/2019  12:52 PM This was a shared visit with Ned Card.  Valerie [name] was interviewed and examined.  She is tolerating the atezolizumab/Avastin well.  Her clinical status appears partially improved.  She will complete cycle 3 today. The neutropenia and thrombocytopenia are likely secondary to cirrhosis. Julieanne Manson, MD

## 2019-08-26 ENCOUNTER — Telehealth: Payer: Self-pay | Admitting: Emergency Medicine

## 2019-08-26 DIAGNOSIS — R059 Cough, unspecified: Secondary | ICD-10-CM

## 2019-08-26 NOTE — Telephone Encounter (Signed)
Returning pt's VM requesting to speak with Sandi Mealy [facility]).  Spoke with pt who reports new cough (dry) triggered by exertion/laughing that causes SOB for sveral days and severe headache since this weekend.  Pt denies acute SOB or CP or fever/chills.  Orders placed for CXR & labs.  Pt agreed to 0900 CXR, 0930 labs and [facility] appts tomorrow (08/27/19), high priority scheduling message placed.  Pt denies any further questions/concerns at this time.

## 2019-08-27 ENCOUNTER — Inpatient Hospital Stay (HOSPITAL_COMMUNITY)
Admission: EM | Admit: 2019-08-27 | Discharge: 2019-08-31 | DRG: 177 | Disposition: A | Discharge status: Home / Self Care | Payer: HRSA Program | Attending: Internal Medicine | Admitting: Internal Medicine

## 2019-08-27 ENCOUNTER — Inpatient Hospital Stay (HOSPITAL_BASED_OUTPATIENT_CLINIC_OR_DEPARTMENT_OTHER): Payer: Medicaid Other | Admitting: Adult Health

## 2019-08-27 ENCOUNTER — Emergency Department (HOSPITAL_COMMUNITY): Payer: HRSA Program

## 2019-08-27 ENCOUNTER — Other Ambulatory Visit: Payer: Self-pay

## 2019-08-27 ENCOUNTER — Telehealth: Payer: Self-pay | Admitting: Emergency Medicine

## 2019-08-27 ENCOUNTER — Encounter (HOSPITAL_COMMUNITY): Payer: Self-pay

## 2019-08-27 ENCOUNTER — Ambulatory Visit (HOSPITAL_COMMUNITY)
Admission: RE | Admit: 2019-08-27 | Discharge: 2019-08-27 | Disposition: A | Discharge status: Home / Self Care | Payer: Medicaid Other | Source: Ambulatory Visit | Attending: Medical | Admitting: Medical

## 2019-08-27 ENCOUNTER — Inpatient Hospital Stay: Payer: Medicaid Other

## 2019-08-27 VITALS — BP 201/67 | HR 92 | Temp 100.4°F | Resp 17

## 2019-08-27 DIAGNOSIS — Z888 Allergy status to other drugs, medicaments and biological substances status: Secondary | ICD-10-CM | POA: Diagnosis not present

## 2019-08-27 DIAGNOSIS — F319 Bipolar disorder, unspecified: Secondary | ICD-10-CM | POA: Diagnosis present

## 2019-08-27 DIAGNOSIS — J1282 Pneumonia due to coronavirus disease 2019: Secondary | ICD-10-CM

## 2019-08-27 DIAGNOSIS — G8929 Other chronic pain: Secondary | ICD-10-CM | POA: Diagnosis present

## 2019-08-27 DIAGNOSIS — Z87892 Personal history of anaphylaxis: Secondary | ICD-10-CM

## 2019-08-27 DIAGNOSIS — D703 Neutropenia due to infection: Secondary | ICD-10-CM | POA: Diagnosis not present

## 2019-08-27 DIAGNOSIS — C220 Liver cell carcinoma: Secondary | ICD-10-CM | POA: Diagnosis present

## 2019-08-27 DIAGNOSIS — D61818 Other pancytopenia: Secondary | ICD-10-CM | POA: Diagnosis present

## 2019-08-27 DIAGNOSIS — F17210 Nicotine dependence, cigarettes, uncomplicated: Secondary | ICD-10-CM | POA: Diagnosis present

## 2019-08-27 DIAGNOSIS — D696 Thrombocytopenia, unspecified: Secondary | ICD-10-CM | POA: Diagnosis not present

## 2019-08-27 DIAGNOSIS — B1920 Unspecified viral hepatitis C without hepatic coma: Secondary | ICD-10-CM | POA: Diagnosis present

## 2019-08-27 DIAGNOSIS — R5081 Fever presenting with conditions classified elsewhere: Secondary | ICD-10-CM | POA: Diagnosis present

## 2019-08-27 DIAGNOSIS — K7460 Unspecified cirrhosis of liver: Secondary | ICD-10-CM | POA: Diagnosis present

## 2019-08-27 DIAGNOSIS — C7802 Secondary malignant neoplasm of left lung: Secondary | ICD-10-CM | POA: Diagnosis present

## 2019-08-27 DIAGNOSIS — Z66 Do not resuscitate: Secondary | ICD-10-CM | POA: Diagnosis present

## 2019-08-27 DIAGNOSIS — C7801 Secondary malignant neoplasm of right lung: Secondary | ICD-10-CM | POA: Diagnosis present

## 2019-08-27 DIAGNOSIS — Z885 Allergy status to narcotic agent status: Secondary | ICD-10-CM | POA: Diagnosis not present

## 2019-08-27 DIAGNOSIS — Z79899 Other long term (current) drug therapy: Secondary | ICD-10-CM | POA: Diagnosis not present

## 2019-08-27 DIAGNOSIS — I10 Essential (primary) hypertension: Secondary | ICD-10-CM | POA: Diagnosis present

## 2019-08-27 DIAGNOSIS — R180 Malignant ascites: Secondary | ICD-10-CM | POA: Diagnosis present

## 2019-08-27 DIAGNOSIS — A419 Sepsis, unspecified organism: Secondary | ICD-10-CM

## 2019-08-27 DIAGNOSIS — U071 COVID-19: Principal | ICD-10-CM

## 2019-08-27 DIAGNOSIS — D6959 Other secondary thrombocytopenia: Secondary | ICD-10-CM | POA: Diagnosis present

## 2019-08-27 DIAGNOSIS — D709 Neutropenia, unspecified: Secondary | ICD-10-CM | POA: Diagnosis present

## 2019-08-27 DIAGNOSIS — R188 Other ascites: Secondary | ICD-10-CM

## 2019-08-27 DIAGNOSIS — R05 Cough: Secondary | ICD-10-CM | POA: Diagnosis not present

## 2019-08-27 DIAGNOSIS — R059 Cough, unspecified: Secondary | ICD-10-CM

## 2019-08-27 LAB — CBC WITH DIFFERENTIAL/PLATELET
Abs Immature Granulocytes: 0.01 10*3/uL (ref 0.00–0.07)
Basophils Absolute: 0 10*3/uL (ref 0.0–0.1)
Basophils Relative: 0 %
Eosinophils Absolute: 0 10*3/uL (ref 0.0–0.5)
Eosinophils Relative: 1 %
HCT: 36.8 % (ref 36.0–46.0)
Hemoglobin: 12.1 g/dL (ref 12.0–15.0)
Immature Granulocytes: 1 %
Lymphocytes Relative: 40 %
Lymphs Abs: 0.8 10*3/uL (ref 0.7–4.0)
MCH: 32.4 pg (ref 26.0–34.0)
MCHC: 32.9 g/dL (ref 30.0–36.0)
MCV: 98.7 fL (ref 80.0–100.0)
Monocytes Absolute: 0.2 10*3/uL (ref 0.1–1.0)
Monocytes Relative: 11 %
Neutro Abs: 1 10*3/uL — ABNORMAL LOW (ref 1.7–7.7)
Neutrophils Relative %: 47 %
Platelets: 33 10*3/uL — ABNORMAL LOW (ref 150–400)
RBC: 3.73 MIL/uL — ABNORMAL LOW (ref 3.87–5.11)
RDW: 14 % (ref 11.5–15.5)
WBC: 2 10*3/uL — ABNORMAL LOW (ref 4.0–10.5)
nRBC: 0 % (ref 0.0–0.2)

## 2019-08-27 LAB — BODY FLUID CELL COUNT WITH DIFFERENTIAL
Lymphs, Fluid: 77 %
Monocyte-Macrophage-Serous Fluid: 22 % — ABNORMAL LOW (ref 50–90)
Neutrophil Count, Fluid: 1 % (ref 0–25)
Total Nucleated Cell Count, Fluid: 1171 cu mm — ABNORMAL HIGH (ref 0–1000)

## 2019-08-27 LAB — LACTATE DEHYDROGENASE, PLEURAL OR PERITONEAL FLUID: LD, Fluid: 87 U/L — ABNORMAL HIGH (ref 3–23)

## 2019-08-27 LAB — COMPREHENSIVE METABOLIC PANEL
ALT: 54 U/L — ABNORMAL HIGH (ref 0–44)
AST: 102 U/L — ABNORMAL HIGH (ref 15–41)
Albumin: 2.8 g/dL — ABNORMAL LOW (ref 3.5–5.0)
Alkaline Phosphatase: 122 U/L (ref 38–126)
Anion gap: 6 (ref 5–15)
BUN: 8 mg/dL (ref 6–20)
CO2: 26 mmol/L (ref 22–32)
Calcium: 8 mg/dL — ABNORMAL LOW (ref 8.9–10.3)
Chloride: 105 mmol/L (ref 98–111)
Creatinine, Ser: 0.69 mg/dL (ref 0.44–1.00)
GFR calc Af Amer: >60 mL/min (ref >60)
GFR calc non Af Amer: >60 mL/min (ref >60)
Glucose, Bld: 77 mg/dL (ref 70–99)
Potassium: 3.7 mmol/L (ref 3.5–5.1)
Sodium: 137 mmol/L (ref 135–145)
Total Bilirubin: 1.4 mg/dL — ABNORMAL HIGH (ref 0.3–1.2)
Total Protein: 6.9 g/dL (ref 6.5–8.1)

## 2019-08-27 LAB — SARS CORONAVIRUS 2 BY RT PCR (HOSPITAL ORDER, PERFORMED IN ~~LOC~~ HOSPITAL LAB): SARS Coronavirus 2: POSITIVE — AB

## 2019-08-27 LAB — URINALYSIS, ROUTINE W REFLEX MICROSCOPIC
Bilirubin Urine: NEGATIVE
Glucose, UA: NEGATIVE mg/dL
Ketones, ur: NEGATIVE mg/dL
Leukocytes,Ua: NEGATIVE
Nitrite: NEGATIVE
Protein, ur: >=300 mg/dL — AB
Specific Gravity, Urine: 1.016 (ref 1.005–1.030)
pH: 6 (ref 5.0–8.0)

## 2019-08-27 LAB — GRAM STAIN

## 2019-08-27 LAB — GLUCOSE, PLEURAL OR PERITONEAL FLUID: Glucose, Fluid: 82 mg/dL

## 2019-08-27 LAB — PROTIME-INR
INR: 1.5 — ABNORMAL HIGH (ref 0.8–1.2)
Prothrombin Time: 17.5 seconds — ABNORMAL HIGH (ref 11.4–15.2)

## 2019-08-27 LAB — FERRITIN: Ferritin: 730 ng/mL — ABNORMAL HIGH (ref 11–307)

## 2019-08-27 LAB — PROCALCITONIN: Procalcitonin: 0.15 ng/mL

## 2019-08-27 LAB — LACTIC ACID, PLASMA
Lactic Acid, Venous: 1.6 mmol/L (ref 0.5–1.9)
Lactic Acid, Venous: 1.6 mmol/L (ref 0.5–1.9)

## 2019-08-27 LAB — LIPASE, BLOOD: Lipase: 46 U/L (ref 11–51)

## 2019-08-27 LAB — D-DIMER, QUANTITATIVE: D-Dimer, Quant: 13.22 ug/mL-FEU — ABNORMAL HIGH (ref 0.00–0.50)

## 2019-08-27 LAB — APTT: aPTT: 37 seconds — ABNORMAL HIGH (ref 24–36)

## 2019-08-27 LAB — PROTEIN, PLEURAL OR PERITONEAL FLUID: Total protein, fluid: <3 g/dL

## 2019-08-27 LAB — C-REACTIVE PROTEIN: CRP: 0.5 mg/dL (ref <1.0)

## 2019-08-27 MED ORDER — FLUOXETINE HCL 20 MG PO CAPS
20.0000 mg | ORAL_CAPSULE | Freq: Every day | ORAL | Status: DC
  Administered 2019-08-27 – 2019-08-31 (×5): 20 mg via ORAL
  Filled 2019-08-27 (×5): qty 1

## 2019-08-27 MED ORDER — ONDANSETRON HCL 4 MG/2ML IJ SOLN
4.0000 mg | Freq: Four times a day (QID) | INTRAMUSCULAR | Status: DC | PRN
  Administered 2019-08-27 – 2019-08-28 (×2): 4 mg via INTRAVENOUS
  Filled 2019-08-27 (×2): qty 2

## 2019-08-27 MED ORDER — SODIUM CHLORIDE 0.9 % IV SOLN
2.0000 g | INTRAVENOUS | Status: AC
  Administered 2019-08-27: 2 g via INTRAVENOUS
  Filled 2019-08-27: qty 2

## 2019-08-27 MED ORDER — HYDROMORPHONE HCL 2 MG PO TABS
2.0000 mg | ORAL_TABLET | ORAL | Status: DC | PRN
  Administered 2019-08-27 – 2019-08-31 (×12): 2 mg via ORAL
  Filled 2019-08-27 (×14): qty 1

## 2019-08-27 MED ORDER — SODIUM CHLORIDE (PF) 0.9 % IJ SOLN
INTRAMUSCULAR | Status: AC
  Filled 2019-08-27: qty 50

## 2019-08-27 MED ORDER — SODIUM CHLORIDE 0.9 % IV BOLUS
1000.0000 mL | Freq: Once | INTRAVENOUS | Status: AC
  Administered 2019-08-27: 1000 mL via INTRAVENOUS

## 2019-08-27 MED ORDER — SODIUM CHLORIDE 0.9 % IV SOLN
100.0000 mg | INTRAVENOUS | Status: AC
  Administered 2019-08-27: 100 mg via INTRAVENOUS
  Filled 2019-08-27: qty 20

## 2019-08-27 MED ORDER — SODIUM CHLORIDE 0.9 % IV SOLN: INTRAVENOUS | Status: DC

## 2019-08-27 MED ORDER — IBUPROFEN 200 MG PO TABS: 400.0000 mg | ORAL_TABLET | Freq: Once | ORAL | Status: DC

## 2019-08-27 MED ORDER — GUAIFENESIN ER 600 MG PO TB12
600.0000 mg | ORAL_TABLET | Freq: Two times a day (BID) | ORAL | Status: DC
  Administered 2019-08-27 – 2019-08-28 (×3): 600 mg via ORAL
  Filled 2019-08-27 (×3): qty 1

## 2019-08-27 MED ORDER — ASCORBIC ACID 500 MG PO TABS
500.0000 mg | ORAL_TABLET | Freq: Every day | ORAL | Status: DC
  Administered 2019-08-27 – 2019-08-31 (×5): 500 mg via ORAL
  Filled 2019-08-27 (×5): qty 1

## 2019-08-27 MED ORDER — POLYETHYLENE GLYCOL 3350 17 G PO PACK
17.0000 g | PACK | Freq: Every day | ORAL | Status: DC | PRN
  Filled 2019-08-27: qty 1

## 2019-08-27 MED ORDER — SODIUM CHLORIDE 0.9 % IV SOLN
100.0000 mg | Freq: Every day | INTRAVENOUS | Status: AC
  Administered 2019-08-28 – 2019-08-31 (×4): 100 mg via INTRAVENOUS
  Filled 2019-08-27 (×5): qty 20

## 2019-08-27 MED ORDER — VANCOMYCIN HCL 1250 MG/250ML IV SOLN
1250.0000 mg | INTRAVENOUS | Status: AC
  Administered 2019-08-27: 1250 mg via INTRAVENOUS
  Filled 2019-08-27: qty 250

## 2019-08-27 MED ORDER — ZINC SULFATE 220 (50 ZN) MG PO CAPS
220.0000 mg | ORAL_CAPSULE | Freq: Every day | ORAL | Status: DC
  Administered 2019-08-27 – 2019-08-31 (×5): 220 mg via ORAL
  Filled 2019-08-27 (×5): qty 1

## 2019-08-27 MED ORDER — FENTANYL CITRATE (PF) 100 MCG/2ML IJ SOLN
50.0000 ug | Freq: Once | INTRAMUSCULAR | Status: AC
  Administered 2019-08-27: 50 ug via INTRAVENOUS
  Filled 2019-08-27: qty 2

## 2019-08-27 MED ORDER — LIDOCAINE HCL 1 % IJ SOLN
INTRAMUSCULAR | Status: AC
  Filled 2019-08-27: qty 20

## 2019-08-27 MED ORDER — IOHEXOL 300 MG/ML  SOLN
100.0000 mL | Freq: Once | INTRAMUSCULAR | Status: AC | PRN
  Administered 2019-08-27: 100 mL via INTRAVENOUS

## 2019-08-27 MED ORDER — DEXAMETHASONE SODIUM PHOSPHATE 10 MG/ML IJ SOLN
6.0000 mg | INTRAMUSCULAR | Status: DC
  Administered 2019-08-27 – 2019-08-30 (×4): 6 mg via INTRAVENOUS
  Filled 2019-08-27 (×4): qty 1

## 2019-08-27 MED ORDER — SODIUM CHLORIDE 0.9 % IV SOLN
2.0000 g | INTRAVENOUS | Status: DC
  Administered 2019-08-27: 2 g via INTRAVENOUS
  Filled 2019-08-27 (×2): qty 20

## 2019-08-27 MED ORDER — METOPROLOL TARTRATE 25 MG PO TABS
25.0000 mg | ORAL_TABLET | Freq: Two times a day (BID) | ORAL | Status: DC
  Administered 2019-08-27 – 2019-08-28 (×4): 25 mg via ORAL
  Filled 2019-08-27 (×5): qty 1

## 2019-08-27 NOTE — Progress Notes (Signed)
These preliminary result these preliminary results were noted.  Awaiting final report.

## 2019-08-27 NOTE — Telephone Encounter (Signed)
Received call from NP [facility].  Pt seen in lobby crying and reporting new CP and severe SOB while waiting for lab draw.  Pt taken to breast center to be seen by NP, labs to be drawn in the back.  Pt finished CXR as ordered this am for [facility] appt.  NP Mendel Ryder states she will assess patient and may take her to the [facility] d/t her new & worsening symptoms.  PA Lucianne Lei made aware.

## 2019-08-27 NOTE — ED Notes (Signed)
ED TO INPATIENT HANDOFF REPORT  ED Nurse Name and Phone #: [phone]  S Name/Age/Gender Valerie [name] 54 y.o. female Room/Bed: WA06/WA06  Code Status   Code Status: Full Code  Home/SNF/Other Home Patient oriented to: self, place, time and situation Is this baseline? Yes   Triage Complete: Triage complete  Chief Complaint Neutropenia ([place]) [D70.9] Febrile neutropenia ([place]) [D70.9, R50.81]  Triage Note Patient coming from cancer center with complaint of chest pain, abdominal pain and SOB. Patient state she is has stage 4 lung and liver cancer. Patient rating her pain 10/10.    Allergies Allergies  Allergen Reactions  . Norco [Hydrocodone-Acetaminophen] Anaphylaxis and Shortness Of Breath  . Tylenol [Acetaminophen] Other (See Comments)    Cannot take due to liver    Level of Care/Admitting Diagnosis ED Disposition    ED Disposition Condition [place]: [place] [100102]  Level of Care: Med-Surg [16]  May admit patient to [facility] or [facility] if equivalent level of care is available:: Yes  Covid Evaluation: Confirmed COVID Positive  Diagnosis: Neutropenia [facility]) [[id number]]  Admitting Physician: Shelly Coss [[id number]]  Attending Physician: Shelly Coss [[id number]]  Estimated length of stay: past midnight tomorrow  Certification:: I certify this patient will need inpatient services for at least 2 midnights       B Medical/Surgery History Past Medical History:  Diagnosis Date  . Bipolar disorder ([place])   . Hepatitis C   . Liver cirrhosis ([place])   . Pseudoseizures    Past Surgical History:  Procedure Laterality Date  . CESAREAN SECTION       A IV Location/Drains/Wounds Patient Lines/Drains/Airways Status   Active Line/Drains/Airways    Name:   Placement date:   Placement time:   Site:   Days:   Peripheral IV 08/27/19 Left Antecubital   08/27/19    0940    Antecubital   less than 1   Peripheral IV 08/27/19  Right Antecubital   08/27/19    1000    Antecubital   less than 1          Intake/Output Last 24 hours No intake or output data in the 24 hours ending 08/27/19 1427  Labs/Imaging Results for orders placed or performed during the hospital encounter of 08/27/19 (from the past 48 hour(s))  Lactic acid, plasma     Status: None   Collection Time: 08/27/19  9:42 AM  Result Value Ref Range   Lactic Acid, Venous 1.6 0.5 - 1.9 mmol/L    Comment: Performed at [facility], [place] [address] [place], [place] [address]  Comprehensive metabolic panel     Status: Abnormal   Collection Time: 08/27/19  9:42 AM  Result Value Ref Range   Sodium 137 135 - 145 mmol/L   Potassium 3.7 3.5 - 5.1 mmol/L   Chloride 105 98 - 111 mmol/L   CO2 26 22 - 32 mmol/L   Glucose, Bld 77 70 - 99 mg/dL    Comment: Glucose reference range applies only to samples taken after fasting for at least 8 hours.   BUN 8 6 - 20 mg/dL   Creatinine, Ser 0.69 0.44 - 1.00 mg/dL   Calcium 8.0 (L) 8.9 - 10.3 mg/dL   Total Protein 6.9 6.5 - 8.1 g/dL   Albumin 2.8 (L) 3.5 - 5.0 g/dL   AST 102 (H) 15 - 41 U/L   ALT 54 (H) 0 - 44 U/L   Alkaline Phosphatase 122  38 - 126 U/L   Total Bilirubin 1.4 (H) 0.3 - 1.2 mg/dL   GFR calc non Af Amer >60 >60 mL/min   GFR calc Af Amer >60 >60 mL/min   Anion gap 6 5 - 15    Comment: Performed at [facility], [place] [address], [place], [place] [address]  CBC WITH DIFFERENTIAL     Status: Abnormal   Collection Time: 08/27/19  9:42 AM  Result Value Ref Range   WBC 2.0 (L) 4.0 - 10.5 K/uL   RBC 3.73 (L) 3.87 - 5.11 MIL/uL   Hemoglobin 12.1 12.0 - 15.0 g/dL   HCT 36.8 36.0 - 46.0 %   MCV 98.7 80.0 - 100.0 fL   MCH 32.4 26.0 - 34.0 pg   MCHC 32.9 30.0 - 36.0 g/dL   RDW 14.0 11.5 - 15.5 %   Platelets 33 (L) 150 - 400 K/uL    Comment: Immature Platelet Fraction may be clinically indicated, consider ordering this additional test [id number]    nRBC 0.0 0.0 - 0.2  %   Neutrophils Relative % 47 %   Neutro Abs 1.0 (L) 1.7 - 7.7 K/uL   Lymphocytes Relative 40 %   Lymphs Abs 0.8 0.7 - 4.0 K/uL   Monocytes Relative 11 %   Monocytes Absolute 0.2 0.1 - 1.0 K/uL   Eosinophils Relative 1 %   Eosinophils Absolute 0.0 0.0 - 0.5 K/uL   Basophils Relative 0 %   Basophils Absolute 0.0 0.0 - 0.1 K/uL   Immature Granulocytes 1 %   Abs Immature Granulocytes 0.01 0.00 - 0.07 K/uL    Comment: Performed at [facility], [place] [address] [place], [place] [address]  APTT     Status: Abnormal   Collection Time: 08/27/19  9:42 AM  Result Value Ref Range   aPTT 37 (H) 24 - 36 seconds    Comment:        IF BASELINE aPTT IS ELEVATED, SUGGEST PATIENT RISK ASSESSMENT BE USED TO DETERMINE APPROPRIATE ANTICOAGULANT THERAPY. Performed at [facility], [place] [address], [place], [place] [address]   Protime-INR     Status: Abnormal   Collection Time: 08/27/19  9:42 AM  Result Value Ref Range   Prothrombin Time 17.5 (H) 11.4 - 15.2 seconds   INR 1.5 (H) 0.8 - 1.2    Comment: (NOTE) INR goal varies based on device and disease states. Performed at [facility], [place] [address], [place], [place] [address]   Lipase, blood     Status: None   Collection Time: 08/27/19  9:42 AM  Result Value Ref Range   Lipase 46 11 - 51 U/L    Comment: Performed at [facility], [place] [address] [place],  [address]  Urinalysis, Routine w reflex microscopic     Status: Abnormal   Collection Time: 08/27/19 10:16 AM  Result Value Ref Range   Color, Urine YELLOW YELLOW   APPearance CLEAR CLEAR   Specific Gravity, Urine 1.016 1.005 - 1.030   pH 6.0 5.0 - 8.0   Glucose, UA NEGATIVE NEGATIVE mg/dL   Hgb urine dipstick MODERATE (A) NEGATIVE   Bilirubin Urine NEGATIVE NEGATIVE   Ketones, ur NEGATIVE NEGATIVE mg/dL   Protein, ur >=300 (A) NEGATIVE mg/dL   Nitrite NEGATIVE NEGATIVE   Leukocytes,Ua NEGATIVE NEGATIVE    RBC / HPF 6-10 0 - 5 RBC/hpf   WBC, UA 0-5 0 - 5 WBC/hpf   Bacteria, UA RARE (  A) NONE SEEN   Squamous Epithelial / LPF 0-5 0 - 5   Mucus PRESENT    Hyaline Casts, UA PRESENT     Comment: Performed at [facility], [place] [address], [place], [place] [address]  SARS Coronavirus 2 by RT PCR (hospital order, performed in [facility] hospital lab) Nasopharyngeal Urine, Clean Catch     Status: Abnormal   Collection Time: 08/27/19 10:16 AM   Specimen: Urine, Clean Catch; Nasopharyngeal  Result Value Ref Range   SARS Coronavirus 2 POSITIVE (A) NEGATIVE    Comment: RESULT CALLED TO, READ BACK BY AND VERIFIED WITH: M.Regginald Pask AT 7510 ON 08/27/19 BY N.THOMPSON (NOTE) SARS-CoV-2 target nucleic acids are DETECTED SARS-CoV-2 RNA is generally detectable in upper respiratory specimens  during the acute phase of infection.  Positive results are indicative  of the presence of the identified virus, but do not rule out bacterial infection or co-infection with other pathogens not detected by the test.  Clinical correlation with patient history and  other diagnostic information is necessary to determine patient infection status.  The expected result is negative. Fact Sheet for Patients:   StrictlyIdeas.no  Fact Sheet for Healthcare Providers:   BankingDealers.co.za   This test is not yet approved or cleared by the [place] FDA and  has been authorized for detection and/or diagnosis of SARS-CoV-2 by FDA under an Emergency Use Authorization (EUA).  This EUA will remain in effect (meaning this tes t can be used) for the duration of  the COVID-19 declaration under Section 564(b)(1) of the Act, 21 U.S.C. section 360-bbb-3(b)(1), unless the authorization is terminated or revoked sooner. Performed at [facility], [place] [address] [place], [place] [address]   Lactic acid, plasma     Status: None   Collection Time: 08/27/19  11:50 AM  Result Value Ref Range   Lactic Acid, Venous 1.6 0.5 - 1.9 mmol/L    Comment: Performed at [facility], [place] [address] [place], [place] [address]   DG Chest 2 View  Result Date: 08/27/2019 CLINICAL DATA:  Cough and shortness of breath. EXAM: CHEST - 2 VIEW COMPARISON:  06/30/2019 chest CT. FINDINGS: Pulmonary nodules that are underestimated relative to CT. Continued right lower lobe opacity and volume loss associated with large liver mass and trans diaphragmatic spread by cross-sectional imaging. No visible effusion or pneumothorax. Normal heart size. IMPRESSION: Stable malignancy associated findings when compared with recent cross-sectional imaging. Electronically Signed   By: Monte Fantasia M.D.   On: 08/27/2019 08:59   CT ABDOMEN PELVIS W CONTRAST  Result Date: 08/27/2019 CLINICAL DATA:  Upper abdominal pain and fever.  Neutropenia EXAM: CT ABDOMEN AND PELVIS WITH CONTRAST TECHNIQUE: Multidetector CT imaging of the abdomen and pelvis was performed using the standard protocol following bolus administration of intravenous contrast. CONTRAST:  167mL OMNIPAQUE IOHEXOL 300 MG/ML  SOLN COMPARISON:  06/28/2019 abdominal CT FINDINGS: Lower chest: Pulmonary nodules and band of atelectatic opacity in the right lower lobe. Pulmonary nodules are less prominent and improved. There are a few ground-glass opacities in the left lung. Hepatobiliary: Known large (up to 8 cm) mass in the right upper liver involving the diaphragm and pressing on the hepatic cava. There is underlying cirrhosis. The mass is decreased in size with less trans diaphragmatic spread and more central cystic appearance. There is also less mass effect on the hepatic cava.No evidence of biliary obstruction or stone. Pancreas: Unremarkable. Spleen: Chronic splenomegaly. Adrenals/Urinary Tract: Negative adrenals. No hydronephrosis or  stone. Unremarkable bladder. Stomach/Bowel:  No obstruction. No visible inflammation  Vascular/Lymphatic: Lower periesophageal varices. Diffuse mesenteric collaterals. No acute finding. Atherosclerotic calcification of the aorta and iliacs Reproductive:Negative Other: Large volume ascites which is significantly increased from before. The fluid is generalized and low-density. No definite peritoneal nodularity when accounting for hypertrophy vessels. Musculoskeletal: No acute abnormalities. IMPRESSION: 1. New large volume ascites which is simple density. 2. Positive treatment response with decreased right liver mass and diaphragmatic involvement. Pulmonary nodules are also improved. The mass has more cavitary features centrally, but no acute hemorrhage is noted. 3. Ground-glass opacities are newly seen in the left lung, often atypical infection. Electronically Signed   By: Monte Fantasia M.D.   On: 08/27/2019 11:08   DG Chest Port 1 View  Result Date: 08/27/2019 CLINICAL DATA:  Chest pain, shortness of breath. EXAM: PORTABLE CHEST 1 VIEW COMPARISON:  Aug 27, 2019. June 30, 2019. FINDINGS: Stable cardiomediastinal silhouette. No pneumothorax is noted. Stable right basilar opacity is noted consistent with liver mass and possible trans diaphragmatic spread. No significant pleural effusion is noted. Bony thorax is unremarkable. IMPRESSION: Stable right basilar opacity is noted consistent with liver mass and trans diaphragmatic spread based on prior CT scan. No significant pleural effusion is noted. Electronically Signed   By: Marijo Conception M.D.   On: 08/27/2019 09:55    Pending Labs Unresulted Labs (From admission, onward)    Start     Ordered   08/28/19 8119  Basic metabolic panel  Tomorrow morning,   R     08/27/19 1409   08/28/19 0500  CBC  Tomorrow morning,   R     08/27/19 1409   08/27/19 1418  Ferritin  Daily,   R     08/27/19 1417   08/27/19 1418  D-dimer, quantitative (not at [facility])  Daily,   R     08/27/19 1417   08/27/19 1418  C-reactive protein  Daily,   R     08/27/19 1417    08/27/19 0923  Blood Culture (routine x 2)  BLOOD CULTURE X 2,   STAT     08/27/19 0923   08/27/19 0923  Urine culture  ONCE - STAT,   STAT     08/27/19 0923          Vitals/Pain Today's Vitals   08/27/19 1215 08/27/19 1230 08/27/19 1245 08/27/19 1300  BP:  (!) 161/107  (!) 156/94  Pulse:   88   Resp: (!) 21 (!) 22 (!) 23 (!) 30  Temp:      TempSrc:      SpO2:   97%   Weight:      Height:      PainSc:        Isolation Precautions No active isolations  Medications Medications  lidocaine (XYLOCAINE) 1 % (with pres) injection (has no administration in time range)  HYDROmorphone (DILAUDID) tablet 2 mg (has no administration in time range)  metoprolol tartrate (LOPRESSOR) tablet 25 mg (has no administration in time range)  FLUoxetine (PROZAC) capsule 20 mg (has no administration in time range)  polyethylene glycol (MIRALAX / GLYCOLAX) packet 17 g (has no administration in time range)  dexamethasone (DECADRON) injection 6 mg (has no administration in time range)  ascorbic acid (VITAMIN C) tablet 500 mg (has no administration in time range)  zinc sulfate capsule 220 mg (has no administration in time range)  remdesivir 100 mg in sodium chloride 0.9 % 100 mL IVPB (has no  administration in time range)  remdesivir 100 mg in sodium chloride 0.9 % 100 mL IVPB (has no administration in time range)  fentaNYL (SUBLIMAZE) injection 50 mcg (50 mcg Intravenous Given 08/27/19 1003)  ceFEPIme (MAXIPIME) 2 g in sodium chloride 0.9 % 100 mL IVPB (0 g Intravenous Stopped 08/27/19 1110)  vancomycin (VANCOREADY) IVPB 1250 mg/250 mL (1,250 mg Intravenous New Bag/Given 08/27/19 1112)  sodium chloride 0.9 % bolus 1,000 mL (0 mLs Intravenous Stopped 08/27/19 1150)  iohexol (OMNIPAQUE) 300 MG/ML solution 100 mL (100 mLs Intravenous Contrast Given 08/27/19 1040)    Mobility walks Moderate fall risk   Focused Assessments .   R Recommendations: See Admitting Provider Note  Report given to:    Additional Notes: n/a

## 2019-08-27 NOTE — H&P (Addendum)
History and Physical    Valerie [name] MWN:[id number] DOB: 25-Aug-1965 DOA: 08/27/2019  PCP: Charlott Rakes, MD   Patient coming from: Home    Chief Complaint: Fever  HPI: Valerie [name] is a 54 y.o. female with medical history significant of hepatocellular carcinoma, hypertension who was sent from cancer center to the emergency department for the evaluation of fever. Patient lives with her daughter .As per the report, fever started yesterday.  She also had mild cough, chest pain, ongoing abdominal discomfort and her belly was getting bigger.  She denied any contact with person positive with Covid illness. She was hypertensive on presentation.  Suspected to be septic on presentation and was given broad spectrum antibiotics to cover for possible neutropenic fever. Patient seen and examined at the bedside in the emergency department.  She was still complaining of abdominal discomfort from her ascites.  Bedside ultrasound guided paracentesis was done.  She was saturating fine on room air.  Complains of some cough but not short of breath on. She denied any chest pain, nausea, vomiting, diarrhea, dysuria or loss of consciousness.  Patient states she is hungry.  ED Course: CT abdomen/pelvis done in the emergency department showed large volume ascites which is a new finding, groundglass opacity in the left lung, pulmonary nodules.  Found to have neutropenia, thrombocytopenia.  Chest x-ray does not show clear pneumonia. Patient admitted for the management of Covid illness with associated pancytopenia,  and possible covid  pneumonia.  Review of Systems: As per HPI otherwise 10 point review of systems negative.    Past Medical History:  Diagnosis Date  . Bipolar disorder ([place])   . Hepatitis C   . Liver cirrhosis ([place])   . Pseudoseizures     Past Surgical History:  Procedure Laterality Date  . CESAREAN SECTION       reports that she has been smoking cigarettes. She has been smoking about 0.50  packs per day. She has never used smokeless tobacco. She reports that she does not drink alcohol or use drugs.  Allergies  Allergen Reactions  . Norco [Hydrocodone-Acetaminophen] Anaphylaxis and Shortness Of Breath  . Tylenol [Acetaminophen] Other (See Comments)    Cannot take due to liver    Family History  Problem Relation Age of Onset  . Heart disease Neg Hx      Prior to Admission medications   Medication Sig Start Date End Date Taking? Authorizing Provider  FLUoxetine (PROZAC) 20 MG capsule Take 1 capsule (20 mg total) by mouth daily. 07/05/19 09/03/19 Yes Kyle, Tyrone A, DO  HYDROmorphone (DILAUDID) 2 MG tablet Take 1 tablet (2 mg total) by mouth every 4 (four) hours as needed for severe pain. 08/02/19  Yes Tanner, Lyndon Code., PA-C  ibuprofen (ADVIL) 600 MG tablet Take 1 tablet (600 mg total) by mouth every 6 (six) hours as needed. 06/22/19  Yes Upstill, Nehemiah Settle, PA-C  methocarbamol (ROBAXIN) 500 MG tablet Take 1 tablet (500 mg total) by mouth 2 (two) times daily. 06/22/19  Yes Upstill, Nehemiah Settle, PA-C  metoprolol tartrate (LOPRESSOR) 25 MG tablet Take 1 tablet (25 mg total) by mouth 2 (two) times daily. 07/22/19 09/20/19 Yes Charlott Rakes, MD  prochlorperazine (COMPAZINE) 10 MG tablet Take 1 tablet (10 mg total) by mouth every 6 (six) hours as needed. 07/12/19  Yes Heilingoetter, Cassandra L, PA-C  sennosides-docusate sodium (SENOKOT-S) 8.6-50 MG tablet Take 1-2 tablets by mouth 2 (two) times daily as needed. 08/02/19  Yes [provider]    Physical Exam: Vitals:  08/27/19 1215 08/27/19 1230 08/27/19 1245 08/27/19 1300  BP:  (!) 161/107  (!) 156/94  Pulse:   88   Resp: (!) 21 (!) 22 (!) 23 (!) 30  Temp:      TempSrc:      SpO2:   97%   Weight:      Height:        Constitutional: In mild to moderate distress due to abdominal discomfort Vitals:   08/27/19 1215 08/27/19 1230 08/27/19 1245 08/27/19 1300  BP:  (!) 161/107  (!) 156/94  Pulse:   88   Resp: (!) 21 (!) 22 (!) 23  (!) 30  Temp:      TempSrc:      SpO2:   97%   Weight:      Height:       Eyes: PERRL, lids and conjunctivae normal ENMT: Mucous membranes are moist.  Neck: normal, supple, no masses, no thyromegaly Respiratory: clear to auscultation bilaterally, no wheezing, no crackles. Normal respiratory effort. No accessory muscle use.  Cardiovascular: Regular rate and rhythm, no murmurs / rubs / gallops. No extremity edema.  Abdomen: mild generalized  tenderness, ascites,no masses palpated. No hepatosplenomegaly. Bowel sounds positive.  Musculoskeletal: no clubbing / cyanosis. No joint deformity upper and lower extremities.  Skin: no rashes, lesions, ulcers. No induration Neurologic: CN 2-12 grossly intact.  Strength 5/5 in all 4.  Psychiatric: Normal judgment and insight. Alert and oriented x 3. Normal mood.   Foley Catheter:None  Labs on Admission: I have personally reviewed following labs and imaging studies  CBC: Recent Labs  Lab 08/22/19 1215 08/27/19 0942  WBC 3.0* 2.0*  NEUTROABS 1.2* 1.0*  HGB 11.6* 12.1  HCT 36.0 36.8  MCV 98.4 98.7  PLT 44* 33*   Basic Metabolic Panel: Recent Labs  Lab 08/22/19 1215 08/27/19 0942  NA 140 137  K 3.8 3.7  CL 108 105  CO2 26 26  GLUCOSE 83 77  BUN 7 8  CREATININE 0.72 0.69  CALCIUM 8.2* 8.0*   GFR: Estimated Creatinine Clearance: 71.6 mL/min (by C-G formula based on SCr of 0.69 mg/dL). Liver Function Tests: Recent Labs  Lab 08/22/19 1215 08/27/19 0942  AST 92* 102*  ALT 55* 54*  ALKPHOS 163* 122  BILITOT 1.4* 1.4*  PROT 6.8 6.9  ALBUMIN 2.6* 2.8*   Recent Labs  Lab 08/27/19 0942  LIPASE 46   No results for input(s): AMMONIA in the last 168 hours. Coagulation Profile: Recent Labs  Lab 08/27/19 0942  INR 1.5*   Cardiac Enzymes: No results for input(s): CKTOTAL, CKMB, CKMBINDEX, TROPONINI in the last 168 hours. BNP (last 3 results) No results for input(s): PROBNP in the last 8760 hours. HbA1C: No results for  input(s): HGBA1C in the last 72 hours. CBG: No results for input(s): GLUCAP in the last 168 hours. Lipid Profile: No results for input(s): CHOL, HDL, LDLCALC, TRIG, CHOLHDL, LDLDIRECT in the last 72 hours. Thyroid Function Tests: No results for input(s): TSH, T4TOTAL, FREET4, T3FREE, THYROIDAB in the last 72 hours. Anemia Panel: No results for input(s): VITAMINB12, FOLATE, FERRITIN, TIBC, IRON, RETICCTPCT in the last 72 hours. Urine analysis:    Component Value Date/Time   COLORURINE YELLOW 08/27/2019 1016   APPEARANCEUR CLEAR 08/27/2019 1016   LABSPEC 1.016 08/27/2019 1016   PHURINE 6.0 08/27/2019 1016   GLUCOSEU NEGATIVE 08/27/2019 1016   HGBUR MODERATE (A) 08/27/2019 1016   BILIRUBINUR NEGATIVE 08/27/2019 1016   [place] 08/27/2019 1016   PROTEINUR >=300 (  A) 08/27/2019 1016   NITRITE NEGATIVE 08/27/2019 [place] 08/27/2019 1016    Radiological Exams on Admission: DG Chest 2 View  Result Date: 08/27/2019 CLINICAL DATA:  Cough and shortness of breath. EXAM: CHEST - 2 VIEW COMPARISON:  06/30/2019 chest CT. FINDINGS: Pulmonary nodules that are underestimated relative to CT. Continued right lower lobe opacity and volume loss associated with large liver mass and trans diaphragmatic spread by cross-sectional imaging. No visible effusion or pneumothorax. Normal heart size. IMPRESSION: Stable malignancy associated findings when compared with recent cross-sectional imaging. Electronically Signed   By: Monte Fantasia M.D.   On: 08/27/2019 08:59   CT ABDOMEN PELVIS W CONTRAST  Result Date: 08/27/2019 CLINICAL DATA:  Upper abdominal pain and fever.  Neutropenia EXAM: CT ABDOMEN AND PELVIS WITH CONTRAST TECHNIQUE: Multidetector CT imaging of the abdomen and pelvis was performed using the standard protocol following bolus administration of intravenous contrast. CONTRAST:  152mL OMNIPAQUE IOHEXOL 300 MG/ML  SOLN COMPARISON:  06/28/2019 abdominal CT FINDINGS: Lower  chest: Pulmonary nodules and band of atelectatic opacity in the right lower lobe. Pulmonary nodules are less prominent and improved. There are a few ground-glass opacities in the left lung. Hepatobiliary: Known large (up to 8 cm) mass in the right upper liver involving the diaphragm and pressing on the hepatic cava. There is underlying cirrhosis. The mass is decreased in size with less trans diaphragmatic spread and more central cystic appearance. There is also less mass effect on the hepatic cava.No evidence of biliary obstruction or stone. Pancreas: Unremarkable. Spleen: Chronic splenomegaly. Adrenals/Urinary Tract: Negative adrenals. No hydronephrosis or stone. Unremarkable bladder. Stomach/Bowel:  No obstruction. No visible inflammation Vascular/Lymphatic: Lower periesophageal varices. Diffuse mesenteric collaterals. No acute finding. Atherosclerotic calcification of the aorta and iliacs Reproductive:Negative Other: Large volume ascites which is significantly increased from before. The fluid is generalized and low-density. No definite peritoneal nodularity when accounting for hypertrophy vessels. Musculoskeletal: No acute abnormalities. IMPRESSION: 1. New large volume ascites which is simple density. 2. Positive treatment response with decreased right liver mass and diaphragmatic involvement. Pulmonary nodules are also improved. The mass has more cavitary features centrally, but no acute hemorrhage is noted. 3. Ground-glass opacities are newly seen in the left lung, often atypical infection. Electronically Signed   By: Monte Fantasia M.D.   On: 08/27/2019 11:08   DG Chest Port 1 View  Result Date: 08/27/2019 CLINICAL DATA:  Chest pain, shortness of breath. EXAM: PORTABLE CHEST 1 VIEW COMPARISON:  Aug 27, 2019. June 30, 2019. FINDINGS: Stable cardiomediastinal silhouette. No pneumothorax is noted. Stable right basilar opacity is noted consistent with liver mass and possible trans diaphragmatic spread. No  significant pleural effusion is noted. Bony thorax is unremarkable. IMPRESSION: Stable right basilar opacity is noted consistent with liver mass and trans diaphragmatic spread based on prior CT scan. No significant pleural effusion is noted. Electronically Signed   By: Marijo Conception M.D.   On: 08/27/2019 09:55     Assessment/Plan Principal Problem:   Neutropenia (H[facility]) Active Problems:   Thrombocytopenia ([place])   Hepatocellular carcinoma (H[facility])   Febrile neutropenia ([place])   COVID-19   Covid illness/PNA: Presented with fever without any respiratory symptoms.  Covid screen test came out to be positive.  CT abdomen/pelvis showed groundglass opacity in the left lung which could be due to Covid pneumonia.  She is not hypoxic but has tachypnea.  Saturating fine on room air.Complains of some cough. We will start her on dexamethasone and remdesivir. Check  procalcitonin.  Fever/neutropenia:Has neutropenia.  This could be associated with Covid itself or could be associated with malignancy/chemotherapy.  Continue to monitor CBC.  I do not think she needs Granix at this point.  Metastatic hepatocellular carcinoma: Metastatic disease to the lungs.  Has cirrhosis, large heterogeneous mass in the right liver extending to the right lung base as per CT on 06/28/2019.  CT on 06/30/2019 showed multiple pulmonary nodules concerning for widespread metastatic lung disease.  She is  currently on chemotherapy, cycle 3 with atezolizumab/bevacizumab .  Follows with oncology,Dr [employer].  Ascites: CT abdomen/pelvis done here showed finding of new large volume ascites.  Ultrasound-guided paracentesis has been ordered.  We will follow-up fluid analysis.  She was complaining of abdominal pain pain.  Rule out SBP.  History of hepatitis C: Report of being reated with 16 weeks of interferon, ribavirin.  HCV RNA was 634,000 as per 07/01/19.  She has liver cirrhosis.  History of bipolar disorder/depression: On  fluoxetine  Hypertension: Hypertensive on presentation.  Takes metoprolol at home which will be continued.  Thrombocytopenia: Most likely associated with hepatocellular carcinoma, chemotherapy.Chronic.  Continue to monitor.   Addendum: Ascitic fluid analysis was positive of SBP.  Started on antibiotics.  Will follow up ascitic fluid culture.  She needs to be on indefinite prophylaxis for SBP on discharge after completion of antibiotics course.  Severity of Illness: The appropriate patient status for this patient is INPATIENT.  DVT prophylaxis: SCD Code Status: DNR Family Communication: Daughter present at the bedside Consults called: None     Shelly Coss MD [facility]  08/27/2019, 2:14 PM

## 2019-08-27 NOTE — Progress Notes (Signed)
A consult was received from an ED physician for Vancomycin and Cefepime per pharmacy dosing.  The patient's profile has been reviewed for ht/wt/allergies/indication/available labs.    A one time order has been placed for Vancomycin 1250mg  IV and Cefepime 2g IV.  Further antibiotics/pharmacy consults should be ordered by admitting physician if indicated.                       Thank you, Luiz Ochoa 08/27/2019  9:38 AM

## 2019-08-27 NOTE — ED Notes (Signed)
Pt assigned 6E bed, not appropriate due to COVID status.

## 2019-08-27 NOTE — ED Provider Notes (Signed)
[facility] DEPT Provider Note   CSN: [id number] Arrival date & time: 08/27/19  2951     History Chief Complaint  Patient presents with  . Fever    cancer patient    Valerie [name] is a 54 y.o. female.  The history is provided by the patient.  Fever Max temp prior to arrival:  101 Temp source:  Oral Onset quality:  Gradual Duration:  2 days Timing:  Constant Progression:  Unchanged Chronicity:  New Relieved by:  Nothing Worsened by:  Nothing Associated symptoms: chest pain, chills and cough   Associated symptoms: no dysuria, no ear pain, no rash, no sore throat and no vomiting   Risk factors: hx of cancer (on chemotherapy, last infusion 5/13)        Past Medical History:  Diagnosis Date  . Bipolar disorder ([place])   . Hepatitis C   . Liver cirrhosis ([place])   . Pseudoseizures     Patient Active Problem List   Diagnosis Date Noted  . Encounter for antineoplastic immunotherapy 07/12/2019  . Encounter for antineoplastic chemotherapy 07/12/2019  . Hepatocellular carcinoma ([place]) 07/04/2019  . Goals of care, counseling/discussion 07/04/2019  . Liver mass   . Fever 06/29/2019  . SIRS (systemic inflammatory response syndrome) ([place]) 06/29/2019  . Abdominal pain 06/29/2019  . Hepatitis C 11/27/2016  . Thrombocytopenia ([place]) 11/27/2016  . Liver cirrhosis ([place]) 11/27/2016  . Elevated liver enzymes 11/27/2016  . Migraine 11/27/2016  . Hemiplegic migraine 11/27/2016  . Marijuana abuse 11/27/2016  . Normocytic anemia 11/27/2016  . Right sided weakness 11/26/2016    Past Surgical History:  Procedure Laterality Date  . CESAREAN SECTION       OB History   No obstetric history on file.     Family History  Problem Relation Age of Onset  . Heart disease Neg Hx     Social History   Tobacco Use  . Smoking status: Current Every Day Smoker    Packs/day: 0.50    Types: Cigarettes  . Smokeless tobacco: Never Used  Substance Use Topics   . Alcohol use: No  . Drug use: No    Home Medications Prior to Admission medications   Medication Sig Start Date End Date Taking? Authorizing Provider  FLUoxetine (PROZAC) 20 MG capsule Take 1 capsule (20 mg total) by mouth daily. 07/05/19 09/03/19 Yes Kyle, Tyrone A, DO  HYDROmorphone (DILAUDID) 2 MG tablet Take 1 tablet (2 mg total) by mouth every 4 (four) hours as needed for severe pain. 08/02/19  Yes Tanner, Lyndon Code., PA-C  ibuprofen (ADVIL) 600 MG tablet Take 1 tablet (600 mg total) by mouth every 6 (six) hours as needed. 06/22/19  Yes Upstill, Nehemiah Settle, PA-C  methocarbamol (ROBAXIN) 500 MG tablet Take 1 tablet (500 mg total) by mouth 2 (two) times daily. 06/22/19  Yes Upstill, Nehemiah Settle, PA-C  metoprolol tartrate (LOPRESSOR) 25 MG tablet Take 1 tablet (25 mg total) by mouth 2 (two) times daily. 07/22/19 09/20/19 Yes Charlott Rakes, MD  prochlorperazine (COMPAZINE) 10 MG tablet Take 1 tablet (10 mg total) by mouth every 6 (six) hours as needed. 07/12/19  Yes Heilingoetter, Cassandra L, PA-C  sennosides-docusate sodium (SENOKOT-S) 8.6-50 MG tablet Take 1-2 tablets by mouth 2 (two) times daily as needed. 08/02/19  Yes [provider]    Allergies    Norco [hydrocodone-acetaminophen] and Tylenol [acetaminophen]  Review of Systems   Review of Systems  Constitutional: Positive for chills and fever.  HENT: Negative for ear pain  and sore throat.   Eyes: Negative for pain and visual disturbance.  Respiratory: Positive for cough. Negative for shortness of breath.   Cardiovascular: Positive for chest pain. Negative for palpitations.  Gastrointestinal: Positive for abdominal pain (liver cancer). Negative for vomiting.  Genitourinary: Negative for dysuria and hematuria.  Musculoskeletal: Negative for arthralgias and back pain.  Skin: Negative for color change and rash.  Neurological: Negative for seizures and syncope.  All other systems reviewed and are negative.   Physical Exam Updated Vital  Signs  ED Triage Vitals [08/27/19 0923]  Enc Vitals Group     BP (!) 187/110     Pulse Rate 81     Resp 16     Temp (!) 102 F (38.9 C)     Temp Source Oral     SpO2 97 %     Weight 123 lb (55.8 kg)     Height 5\' 6"  (1.676 m)     Head Circumference      Peak Flow      Pain Score      Pain Loc      Pain Edu?      Excl. in [place]?     Physical Exam Vitals and nursing note reviewed.  Constitutional:      General: She is not in acute distress.    Appearance: She is well-developed. She is ill-appearing.  HENT:     Head: Normocephalic and atraumatic.     Nose: Nose normal.     Mouth/Throat:     Mouth: Mucous membranes are moist.  Eyes:     Extraocular Movements: Extraocular movements intact.     Conjunctiva/sclera: Conjunctivae normal.     Pupils: Pupils are equal, round, and reactive to light.  Cardiovascular:     Rate and Rhythm: Normal rate and regular rhythm.     Pulses: Normal pulses.     Heart sounds: Normal heart sounds. No murmur.  Pulmonary:     Effort: Pulmonary effort is normal. No respiratory distress.     Breath sounds: Normal breath sounds.  Abdominal:     General: There is distension.     Palpations: Abdomen is soft. There is no mass.     Tenderness: There is abdominal tenderness. There is no guarding or rebound.     Hernia: No hernia is present.  Musculoskeletal:     Cervical back: Normal range of motion and neck supple.  Skin:    General: Skin is warm and dry.     Capillary Refill: Capillary refill takes less than 2 seconds.  Neurological:     General: No focal deficit present.     Mental Status: She is alert.  Psychiatric:        Mood and Affect: Mood normal.     ED Results / Procedures / Treatments   Labs (all labs ordered are listed, but only abnormal results are displayed) Labs Reviewed  COMPREHENSIVE METABOLIC PANEL - Abnormal; Notable for the following components:      Result Value   Calcium 8.0 (*)    Albumin 2.8 (*)    AST 102 (*)     ALT 54 (*)    Total Bilirubin 1.4 (*)    All other components within normal limits  CBC WITH DIFFERENTIAL/PLATELET - Abnormal; Notable for the following components:   WBC 2.0 (*)    RBC 3.73 (*)    Platelets 33 (*)    Neutro Abs 1.0 (*)    All other components within  normal limits  APTT - Abnormal; Notable for the following components:   aPTT 37 (*)    All other components within normal limits  PROTIME-INR - Abnormal; Notable for the following components:   Prothrombin Time 17.5 (*)    INR 1.5 (*)    All other components within normal limits  URINALYSIS, ROUTINE W REFLEX MICROSCOPIC - Abnormal; Notable for the following components:   Hgb urine dipstick MODERATE (*)    Protein, ur >=300 (*)    Bacteria, UA RARE (*)    All other components within normal limits  CULTURE, BLOOD (ROUTINE X 2)  CULTURE, BLOOD (ROUTINE X 2)  URINE CULTURE  SARS CORONAVIRUS 2 BY RT PCR (HOSPITAL ORDER, [facility])  LACTIC ACID, PLASMA  LIPASE, BLOOD  LACTIC ACID, PLASMA    EKG None  Radiology DG Chest 2 View  Result Date: 08/27/2019 CLINICAL DATA:  Cough and shortness of breath. EXAM: CHEST - 2 VIEW COMPARISON:  06/30/2019 chest CT. FINDINGS: Pulmonary nodules that are underestimated relative to CT. Continued right lower lobe opacity and volume loss associated with large liver mass and trans diaphragmatic spread by cross-sectional imaging. No visible effusion or pneumothorax. Normal heart size. IMPRESSION: Stable malignancy associated findings when compared with recent cross-sectional imaging. Electronically Signed   By: Monte Fantasia M.D.   On: 08/27/2019 08:59   CT ABDOMEN PELVIS W CONTRAST  Result Date: 08/27/2019 CLINICAL DATA:  Upper abdominal pain and fever.  Neutropenia EXAM: CT ABDOMEN AND PELVIS WITH CONTRAST TECHNIQUE: Multidetector CT imaging of the abdomen and pelvis was performed using the standard protocol following bolus administration of intravenous  contrast. CONTRAST:  165mL OMNIPAQUE IOHEXOL 300 MG/ML  SOLN COMPARISON:  06/28/2019 abdominal CT FINDINGS: Lower chest: Pulmonary nodules and band of atelectatic opacity in the right lower lobe. Pulmonary nodules are less prominent and improved. There are a few ground-glass opacities in the left lung. Hepatobiliary: Known large (up to 8 cm) mass in the right upper liver involving the diaphragm and pressing on the hepatic cava. There is underlying cirrhosis. The mass is decreased in size with less trans diaphragmatic spread and more central cystic appearance. There is also less mass effect on the hepatic cava.No evidence of biliary obstruction or stone. Pancreas: Unremarkable. Spleen: Chronic splenomegaly. Adrenals/Urinary Tract: Negative adrenals. No hydronephrosis or stone. Unremarkable bladder. Stomach/Bowel:  No obstruction. No visible inflammation Vascular/Lymphatic: Lower periesophageal varices. Diffuse mesenteric collaterals. No acute finding. Atherosclerotic calcification of the aorta and iliacs Reproductive:Negative Other: Large volume ascites which is significantly increased from before. The fluid is generalized and low-density. No definite peritoneal nodularity when accounting for hypertrophy vessels. Musculoskeletal: No acute abnormalities. IMPRESSION: 1. New large volume ascites which is simple density. 2. Positive treatment response with decreased right liver mass and diaphragmatic involvement. Pulmonary nodules are also improved. The mass has more cavitary features centrally, but no acute hemorrhage is noted. 3. Ground-glass opacities are newly seen in the left lung, often atypical infection. Electronically Signed   By: Monte Fantasia M.D.   On: 08/27/2019 11:08   DG Chest Port 1 View  Result Date: 08/27/2019 CLINICAL DATA:  Chest pain, shortness of breath. EXAM: PORTABLE CHEST 1 VIEW COMPARISON:  Aug 27, 2019. June 30, 2019. FINDINGS: Stable cardiomediastinal silhouette. No pneumothorax is  noted. Stable right basilar opacity is noted consistent with liver mass and possible trans diaphragmatic spread. No significant pleural effusion is noted. Bony thorax is unremarkable. IMPRESSION: Stable right basilar opacity is noted consistent with liver  mass and trans diaphragmatic spread based on prior CT scan. No significant pleural effusion is noted. Electronically Signed   By: Marijo Conception M.D.   On: 08/27/2019 09:55    Procedures .Critical Care Performed by: Lennice Sites, DO Authorized by: Lennice Sites, DO   Critical care provider statement:    Critical care time (minutes):  35   Critical care was necessary to treat or prevent imminent or life-threatening deterioration of the following conditions:  Sepsis   Critical care was time spent personally by me on the following activities:  Blood draw for specimens, development of treatment plan with patient or surrogate, discussions with primary provider, evaluation of patient's response to treatment, examination of patient, ordering and performing treatments and interventions, ordering and review of laboratory studies, ordering and review of radiographic studies, re-evaluation of patient's condition, pulse oximetry, review of old charts and obtaining history from patient or surrogate   I assumed direction of critical care for this patient from another provider in my specialty: no     (including critical care time)  Medications Ordered in ED Medications  vancomycin (VANCOREADY) IVPB 1250 mg/250 mL (1,250 mg Intravenous New Bag/Given 08/27/19 1112)  fentaNYL (SUBLIMAZE) injection 50 mcg (50 mcg Intravenous Given 08/27/19 1003)  ceFEPIme (MAXIPIME) 2 g in sodium chloride 0.9 % 100 mL IVPB (0 g Intravenous Stopped 08/27/19 1110)  sodium chloride 0.9 % bolus 1,000 mL (1,000 mLs Intravenous New Bag/Given 08/27/19 1034)  iohexol (OMNIPAQUE) 300 MG/ML solution 100 mL (100 mLs Intravenous Contrast Given 08/27/19 1040)    ED Course  I have reviewed  the triage vital signs and the nursing notes.  Pertinent labs & imaging results that were available during my care of the patient were reviewed by me and considered in my medical decision making (see chart for details).    MDM Rules/Calculators/A&P                      Wania Longstreth is a 54 year old female with history of hepatic cellular carcinoma currently undergoing chemotherapy with last treatment on 513 who presents to the ED with fever.  Patient sent from cancer clinic after she was found to have a fever.  She states that fever started yesterday.  Possibly mild cough, chest pain, ongoing abdominal pain.  Patient with fever but otherwise unremarkable vitals but blood pressure is mildly elevated at 187/110.  Has some abdominal tenderness on exam.  Sepsis work-up initiated and IV antibiotics empirically given as concern for neutropenic fever.  Will get CT scan abdomen pelvis to further evaluate further infectious process.  Of note she has not had ascites in the past.  mild distention on exam.  Does not appear to have new or different abdominal pain than prior.  Has not been vaccinated against coronavirus.  Will check Covid testing.  Anticipate admission.  Patient with pancytopenia.  Chest x-ray without signs of infection.  Otherwise lab work is unremarkable.  CT scan showed decreased tumor burden but new ascites.  Ultrasound paracentesis has been ordered to rule out SBP but suspect that patient likely with neutropenic fever.  She is empirically been given IV antibiotics.  Lactic acid was normal.  To be admitted for further care.  This chart was dictated using voice recognition software.  Despite best efforts to proofread,  errors can occur which can change the documentation meaning.   Final Clinical Impression(s) / ED Diagnoses Final diagnoses:  Sepsis, due to unspecified organism, unspecified whether acute organ dysfunction  present [facility])  Neutropenic fever ([place])  Pancytopenia ([place])  Ascites     Rx / DC Orders ED Discharge Orders    None       Lennice Sites, DO 08/27/19 1133

## 2019-08-27 NOTE — Progress Notes (Signed)
Patient in lobby of cancer center crying and shouting in pain.  She is reporting chest pain and shortness of breath.  We got a set of vitals.  Her temperature at check in was normal, however 100.4 when we got it.  Due to chest pain, shortness of breath, fever and increased pain, along with BP of 201/107 we sent her to the ER for evaluation.     Wilber Bihari, NP

## 2019-08-27 NOTE — ED Triage Notes (Signed)
Patient coming from cancer center with complaint of chest pain, abdominal pain and SOB. Patient state she is has stage 4 lung and liver cancer. Patient rating her pain 10/10.

## 2019-08-27 NOTE — Procedures (Signed)
Ultrasound-guided diagnostic and therapeutic paracentesis performed yielding 850 cc of blood-tinged fluid. No immediate complications.  The fluid was submitted to the lab for preordered studies. EBL none.

## 2019-08-27 NOTE — ED Notes (Signed)
Patient assisted to bedpan with this RN.

## 2019-08-28 ENCOUNTER — Telehealth: Payer: Self-pay | Admitting: Adult Health

## 2019-08-28 ENCOUNTER — Telehealth: Payer: Self-pay | Admitting: Oncology

## 2019-08-28 ENCOUNTER — Encounter (HOSPITAL_COMMUNITY): Payer: Medicaid Other

## 2019-08-28 DIAGNOSIS — D696 Thrombocytopenia, unspecified: Secondary | ICD-10-CM

## 2019-08-28 DIAGNOSIS — U071 COVID-19: Principal | ICD-10-CM

## 2019-08-28 DIAGNOSIS — C220 Liver cell carcinoma: Secondary | ICD-10-CM

## 2019-08-28 DIAGNOSIS — D709 Neutropenia, unspecified: Secondary | ICD-10-CM

## 2019-08-28 DIAGNOSIS — R5081 Fever presenting with conditions classified elsewhere: Secondary | ICD-10-CM

## 2019-08-28 LAB — URINE CULTURE: Culture: 5000 — AB

## 2019-08-28 LAB — CBC
HCT: 36.8 % (ref 36.0–46.0)
Hemoglobin: 12.1 g/dL (ref 12.0–15.0)
MCH: 32 pg (ref 26.0–34.0)
MCHC: 32.9 g/dL (ref 30.0–36.0)
MCV: 97.4 fL (ref 80.0–100.0)
Platelets: 30 10*3/uL — ABNORMAL LOW (ref 150–400)
RBC: 3.78 MIL/uL — ABNORMAL LOW (ref 3.87–5.11)
RDW: 14 % (ref 11.5–15.5)
WBC: 1.9 10*3/uL — ABNORMAL LOW (ref 4.0–10.5)
nRBC: 0 % (ref 0.0–0.2)

## 2019-08-28 LAB — CYTOLOGY - NON PAP

## 2019-08-28 LAB — FERRITIN: Ferritin: 709 ng/mL — ABNORMAL HIGH (ref 11–307)

## 2019-08-28 LAB — BASIC METABOLIC PANEL
Anion gap: 8 (ref 5–15)
BUN: 11 mg/dL (ref 6–20)
CO2: 22 mmol/L (ref 22–32)
Calcium: 8 mg/dL — ABNORMAL LOW (ref 8.9–10.3)
Chloride: 107 mmol/L (ref 98–111)
Creatinine, Ser: 0.79 mg/dL (ref 0.44–1.00)
GFR calc Af Amer: >60 mL/min (ref >60)
GFR calc non Af Amer: >60 mL/min (ref >60)
Glucose, Bld: 149 mg/dL — ABNORMAL HIGH (ref 70–99)
Potassium: 4.2 mmol/L (ref 3.5–5.1)
Sodium: 137 mmol/L (ref 135–145)

## 2019-08-28 LAB — D-DIMER, QUANTITATIVE: D-Dimer, Quant: 10.75 ug/mL-FEU — ABNORMAL HIGH (ref 0.00–0.50)

## 2019-08-28 LAB — PH, BODY FLUID: pH, Body Fluid: 7.4

## 2019-08-28 LAB — PROCALCITONIN: Procalcitonin: 0.21 ng/mL

## 2019-08-28 LAB — C-REACTIVE PROTEIN: CRP: 0.6 mg/dL (ref <1.0)

## 2019-08-28 MED ORDER — ADULT MULTIVITAMIN W/MINERALS CH
1.0000 | ORAL_TABLET | Freq: Every day | ORAL | Status: DC
  Administered 2019-08-28 – 2019-08-31 (×4): 1 via ORAL
  Filled 2019-08-28 (×4): qty 1

## 2019-08-28 MED ORDER — GUAIFENESIN-DM 100-10 MG/5ML PO SYRP
5.0000 mL | ORAL_SOLUTION | ORAL | Status: DC | PRN
  Administered 2019-08-28 – 2019-08-29 (×3): 5 mL via ORAL
  Filled 2019-08-28 (×4): qty 10

## 2019-08-28 MED ORDER — PRO-STAT SUGAR FREE PO LIQD
30.0000 mL | Freq: Every day | ORAL | Status: DC
  Administered 2019-08-29 – 2019-08-31 (×3): 30 mL via ORAL
  Filled 2019-08-28 (×3): qty 30

## 2019-08-28 MED ORDER — KATE FARMS STANDARD 1.4 PO LIQD
325.0000 mL | ORAL | Status: DC
  Administered 2019-08-28 – 2019-08-29 (×2): 325 mL via ORAL
  Filled 2019-08-28 (×4): qty 325

## 2019-08-28 NOTE — TOC Progression Note (Signed)
Transition of Care [facility]) - Progression Note    Patient Details  Name: Valerie [name] MRN: [id number] Date of Birth: 28-Dec-1965  [facility] of [facility]) CM/SW Contact  Purcell Mouton, RN Phone Number: 08/28/2019, 12:05 PM  Clinical Narrative:    Transferred to ICU.    Expected Discharge Plan: Home/Self Care Barriers to Discharge: No Barriers Identified  Expected Discharge Plan and Services Expected Discharge Plan: Home/Self Care   Discharge Planning Services: CM Consult Post Acute Care Choice: Durable Medical Equipment Living arrangements for the past 2 months: Single Family Home                 DME Arranged: Bedside commode DME Agency: [employer] Date DME Agency Contacted: 08/28/19 Time DME Agency Contacted: 1112           Representative spoke with at [place]: Text to [place] Determinants of Health ([place]) Interventions    Readmission Risk Interventions Readmission Risk Prevention Plan 08/28/2019  Transportation Screening Complete  PCP or Specialist Appt within 3-5 Days Complete  HRI or [place] Complete  Social Work Consult for [place] Planning/Counseling Complete  Palliative Care Screening Not Applicable  Some recent data might be hidden

## 2019-08-28 NOTE — Progress Notes (Signed)
Initial Nutrition Assessment  RD working remotely.   DOCUMENTATION CODES:   Not applicable  INTERVENTION:  - will order [employer] once/day, each supplement provides 455 kcal and 20 grams protein. - will order Magic Cup BID with meals, each supplement provides 290 kcal and 9 grams of protein. - will order 30 mL Prostat BID, each supplement provides 100 kcal and 15 grams of protein. - will order 1 tablet multivitamin with minerals/day.    NUTRITION DIAGNOSIS:   Increased nutrient needs related to catabolic illness, cancer and cancer related treatments, acute illness(COVID-19 infection) as evidenced by estimated needs.  GOAL:   Patient will meet greater than or equal to 90% of their needs  MONITOR:   PO intake, Supplement acceptance, Labs, Weight trends, I & O's  REASON FOR ASSESSMENT:   Malnutrition Screening Tool  ASSESSMENT:   54 y.o. female with medical history of hepatocellular carcinoma and HTN. She was sent from the [place] to the ED for the evaluation of fever. Patient lives with her daughter. ED notes state patient had a mild cough, chest pain, ongoing abdominal pain, and worsening abdominal distention.  No intakes documented since admission; review of [employer] [facility] indicates that patient did not yet order breakfast.   She was last seen by a RD at [facility] two months ago (3/20, 3/24, and 3/25) at that time, she met criteria for malnutrition related to chronic illness (cancer) as evidenced by mild fat depletion, mild muscle depletion, and moderate muscle depletion. Suspect this is ongoing.   Per chart review, weight yesterday was 123 lb and weight on 3/25 was 125 lb. This indicates 2 lb weight loss (1.6% body weight) in 2 month time frame.  Per notes: - ascites s/p paracentesis 5/18 afternoon with 850 ml blood-tinged fluid removed - COVID-19 PNA - neutropenia - metastatic hepatocellular carcinoma with lung mets--s/p cycle 3 of chemo   Labs reviewed; Ca: 8  mg/dl. Medications reviewed; 500 mg ascorbic acid/day, 100 mg IV remdesivir x1 dose/day (5/19-5/22), 220 mg zinc sulfate/day.  IVF; NS @ 75 ml/hr.    NUTRITION - FOCUSED PHYSICAL EXAM:  unable to complete at this time.  Diet Order:   Diet Order            Diet Heart Room service appropriate? Yes; Fluid consistency: Thin  Diet effective now              EDUCATION NEEDS:   No education needs have been identified at this time  Skin:  Skin Assessment: Reviewed RN Assessment  Last BM:  5/19  Height:   Ht Readings from Last 1 Encounters:  08/27/19 _0  (1.676 m)    Weight:   Wt Readings from Last 1 Encounters:  08/27/19 55.8 kg    Estimated Nutritional Needs:  Kcal:  1955-2230 kcal Protein:  105-120 grams Fluid:  >/= 1.5 L/day     Jarome Matin, MS, RD, LDN, CNSC Inpatient Clinical Dietitian RD pager # available in [facility]  After hours/weekend pager # available in [facility]

## 2019-08-28 NOTE — Telephone Encounter (Signed)
No 5/18 los. No changes made to pt's schedule.

## 2019-08-28 NOTE — Telephone Encounter (Signed)
Scheduled per los. Called and spoke with patient. Confirmed appts. Did not scheduled 1week lab due to patient being in hospital

## 2019-08-28 NOTE — Progress Notes (Signed)
PROGRESS NOTE    Valerie [name]  DSK:[id number] DOB: [birth year] DOA: 08/27/2019 PCP: Charlott Rakes, MD    Brief Narrative:  Patient admitted to the hospital with a working diagnosis  SARS COVID-19 viral pneumonia, complicated with decompensated malignant ascites.  54 year old female with past medical history for hepatocellular carcinoma and hypertension who was referred to the ED from the cancer center due to fever.  Patient reported 24 hours of fever associated with chest pain, mild cough abdominal pain and increased abdominal girth.  On her initial physical examination her blood pressure was 156/94, respiratory rate 23-30, heart rate 88, oxygen saturation 97%.  Her lungs are clear to auscultation bilaterally, heart S1-S2, present rhythmic, her abdomen had ascites, generalized tenderness, no lower extremity edema.  SARS COVID-19 positive.  Her chest radiograph had right lower lobe opacity.  CT of the abdomen with new large volume ascites, decreased right liver mass and diaphragmatic involvement, improved pulmonary nodules, groundglass opacities left lung.  Patient was placed on systemic corticosteroids, and remdesivir.  Ultrasound-guided paracentesis-yield 850 cc of bloody tinged fluid.  Assessment & Plan:   Principal Problem:   Neutropenia ([place]) Active Problems:   Thrombocytopenia ([place])   Hepatocellular carcinoma ([facility])   Febrile neutropenia ([place])   COVID-19   1.  SARS COVID-19 viral pneumonia.  RR: 18  Pulse oxymetry: 97 %  Fi02: room air   COVID-19 Labs  Recent Labs    08/27/19 1640 08/28/19 0348  DDIMER 13.22* 10.75*  FERRITIN 730* 709*  CRP 0.5 0.6    Lab Results  Component Value Date   SARSCOV2NAA POSITIVE (A) 08/27/2019   [place] NEGATIVE 06/29/2019   [place] NEGATIVE 06/22/2019   Inflammatory markers trending down, patient with improved dyspnea but not yet back to baseline.   Continue medical therapy with Remdesivir and systemic corticosteroids with  dexamethasone. Continue airway clearing techniques with flutter valve and incentive spirometer. Out of bed to chair tid with meals, physical and occupational therapy.   Will hold on IV fluids, follow a restrictive IV fluids strategy.   2. Ascites/ likely malignant/ metastatic hepatocellular carcinoma Patient sp paracentesis, 850 ml removed, no further abdominal pain, or peritoneal signs on clinical examination. Wbc 1,171, with 77 % lymphocytes and 1% PMN.  Culture with no growth and gram stain with no organisms.   Will hold on antibiotic therapy for now. Continue close monitoring of cell count, cultures and temperature curve.   3. Pancytopenia (neutropenia and thrombocytopenia) stable cell count 1,9, Hgb at 12 and Plt at 30. Patient with lower extremity pain.  Will continue follow cell count, will hold on anticoagulation for now due to thrombocytopenia. Will check [place] lower extremities rule out DVT.   4. Hepatitis C. Will need follow up as outpatient   5. HTN. Continue blood pressure control with metoprolol.   6. Depression/ chronic pain. Continue with fluoxetine. Continue pain control with hydromorphone.    Status is: Inpatient  Remains inpatient appropriate because:IV treatments appropriate due to intensity of illness or inability to take PO   Dispo: The patient is from: Home              Anticipated d/c is to: Home              Anticipated d/c date is: > 3 days              Patient currently is not medically stable to d/c.   DVT prophylaxis: scd   Code Status:   full  Family Communication:  No family at the bedside      Subjective: Patient is feeling better, no abdominal pain, no nausea or vomiting, dyspnea has been improving but not yet back to baseline.   Objective: Vitals:   08/27/19 1605 08/27/19 2139 08/28/19 0212 08/28/19 0607  BP: (!) 155/94 (!) 139/92 128/79 132/82  Pulse: 99 88 64 62  Resp: 19 18 20 18   Temp: 98.9 F (37.2 C) 98.5 F (36.9 C) 97.7 F (36.5  C) 98.1 F (36.7 C)  TempSrc: Oral Oral Oral Oral  SpO2: 97% 90% 94% 94%  Weight:      Height:        Intake/Output Summary (Last 24 hours) at 08/28/2019 0912 Last data filed at 08/27/2019 2000 Gross per 24 hour  Intake 240 ml  Output --  Net 240 ml   Filed Weights   08/27/19 0923  Weight: 55.8 kg    Examination:   General: Not in pain or dyspnea, deconditioned  Neurology: Awake and alert, non focal  E ENT: no pallor, no icterus, oral mucosa moist Cardiovascular: No JVD. S1-S2 present, rhythmic, no gallops, rubs, or murmurs. No lower extremity edema. Pulmonary: positive breath sounds bilaterally. Gastrointestinal. Abdomen soft with, no organomegaly, non tender, no rebound or guarding Skin. No rashes Musculoskeletal: no joint deformities     Data Reviewed: I have personally reviewed following labs and imaging studies  CBC: Recent Labs  Lab 08/22/19 1215 08/27/19 0942 08/28/19 0348  WBC 3.0* 2.0* 1.9*  NEUTROABS 1.2* 1.0*  --   HGB 11.6* 12.1 12.1  HCT 36.0 36.8 36.8  MCV 98.4 98.7 97.4  PLT 44* 33* 30*   Basic Metabolic Panel: Recent Labs  Lab 08/22/19 1215 08/27/19 0942 08/28/19 0348  NA 140 137 137  K 3.8 3.7 4.2  CL 108 105 107  CO2 26 26 22   GLUCOSE 83 77 149*  BUN 7 8 11   CREATININE 0.72 0.69 0.79  CALCIUM 8.2* 8.0* 8.0*   GFR: Estimated Creatinine Clearance: 71.6 mL/min (by C-G formula based on SCr of 0.79 mg/dL). Liver Function Tests: Recent Labs  Lab 08/22/19 1215 08/27/19 0942  AST 92* 102*  ALT 55* 54*  ALKPHOS 163* 122  BILITOT 1.4* 1.4*  PROT 6.8 6.9  ALBUMIN 2.6* 2.8*   Recent Labs  Lab 08/27/19 0942  LIPASE 46   No results for input(s): AMMONIA in the last 168 hours. Coagulation Profile: Recent Labs  Lab 08/27/19 0942  INR 1.5*   Cardiac Enzymes: No results for input(s): CKTOTAL, CKMB, CKMBINDEX, TROPONINI in the last 168 hours. BNP (last 3 results) No results for input(s): PROBNP in the last 8760  hours. HbA1C: No results for input(s): HGBA1C in the last 72 hours. CBG: No results for input(s): GLUCAP in the last 168 hours. Lipid Profile: No results for input(s): CHOL, HDL, LDLCALC, TRIG, CHOLHDL, LDLDIRECT in the last 72 hours. Thyroid Function Tests: No results for input(s): TSH, T4TOTAL, FREET4, T3FREE, THYROIDAB in the last 72 hours. Anemia Panel: Recent Labs    08/27/19 1640 08/28/19 0348  FERRITIN 730* 709*      Radiology Studies: I have reviewed all of the imaging during this hospital visit personally     Scheduled Meds: . vitamin C  500 mg Oral Daily  . dexamethasone (DECADRON) injection  6 mg Intravenous Q24H  . FLUoxetine  20 mg Oral Daily  . guaiFENesin  600 mg Oral BID  . metoprolol tartrate  25 mg Oral BID  . zinc sulfate  220 mg  Oral Daily   Continuous Infusions: . sodium chloride 75 mL/hr at 08/27/19 1702  . cefTRIAXone (ROCEPHIN)  IV 2 g (08/27/19 1756)  . remdesivir 100 mg in NS 100 mL 100 mg (08/28/19 0802)     LOS: 1 day        Zafir Schauer Gerome Apley, MD

## 2019-08-28 NOTE — TOC Progression Note (Signed)
Transition of Care [facility]) - Progression Note    Patient Details  Name: Valerie [name] MRN: [id number] Date of Birth: Jan 14, 1966  [facility]) CM/SW Contact  Purcell Mouton, RN Phone Number: 08/28/2019, 11:13 AM  Clinical Narrative:    Pt was admitted with cco fever, Neutropenia and is COVID+.  Spoke with pt concerning HH needs, transportation and medications. Pt states that she would like a BSC because it is hard for her to go to the [facility]. Pt continued to states the her family is home with her, she will not need HH at this time. Pt asked that appointment be made for her with PCP at [facility] before she is discharged.  BSC was ordered.     Expected Discharge Plan: Home/Self Care Barriers to Discharge: No Barriers Identified  Expected Discharge Plan and Services Expected Discharge Plan: Home/Self Care   Discharge Planning Services: CM Consult Post Acute Care Choice: Durable Medical Equipment Living arrangements for the past 2 months: Single Family Home                 DME Arranged: Bedside commode DME Agency: [employer] Date DME Agency Contacted: 08/28/19 Time DME Agency Contacted: 1112           Representative spoke with at [place]: Text to [place] Determinants of Health ([place]) Interventions    Readmission Risk Interventions Readmission Risk Prevention Plan 08/28/2019  Transportation Screening Complete  PCP or Specialist Appt within 3-5 Days Complete  HRI or [place] Complete  Social Work Consult for [place] Planning/Counseling Complete  Palliative Care Screening Not Applicable  Some recent data might be hidden

## 2019-08-29 ENCOUNTER — Inpatient Hospital Stay (HOSPITAL_COMMUNITY): Payer: HRSA Program

## 2019-08-29 DIAGNOSIS — R609 Edema, unspecified: Secondary | ICD-10-CM

## 2019-08-29 LAB — COMPREHENSIVE METABOLIC PANEL
ALT: 39 U/L (ref 0–44)
AST: 67 U/L — ABNORMAL HIGH (ref 15–41)
Albumin: 2.5 g/dL — ABNORMAL LOW (ref 3.5–5.0)
Alkaline Phosphatase: 108 U/L (ref 38–126)
Anion gap: 5 (ref 5–15)
BUN: 15 mg/dL (ref 6–20)
CO2: 23 mmol/L (ref 22–32)
Calcium: 8.1 mg/dL — ABNORMAL LOW (ref 8.9–10.3)
Chloride: 108 mmol/L (ref 98–111)
Creatinine, Ser: 0.63 mg/dL (ref 0.44–1.00)
GFR calc Af Amer: >60 mL/min (ref >60)
GFR calc non Af Amer: >60 mL/min (ref >60)
Glucose, Bld: 105 mg/dL — ABNORMAL HIGH (ref 70–99)
Potassium: 4.6 mmol/L (ref 3.5–5.1)
Sodium: 136 mmol/L (ref 135–145)
Total Bilirubin: 1 mg/dL (ref 0.3–1.2)
Total Protein: 6.3 g/dL — ABNORMAL LOW (ref 6.5–8.1)

## 2019-08-29 LAB — D-DIMER, QUANTITATIVE: D-Dimer, Quant: 11.27 ug/mL-FEU — ABNORMAL HIGH (ref 0.00–0.50)

## 2019-08-29 LAB — C-REACTIVE PROTEIN: CRP: 0.6 mg/dL (ref <1.0)

## 2019-08-29 LAB — FERRITIN: Ferritin: 575 ng/mL — ABNORMAL HIGH (ref 11–307)

## 2019-08-29 MED ORDER — METOPROLOL TARTRATE 25 MG PO TABS
12.5000 mg | ORAL_TABLET | Freq: Two times a day (BID) | ORAL | Status: DC
  Administered 2019-08-29 – 2019-08-31 (×4): 12.5 mg via ORAL
  Filled 2019-08-29 (×4): qty 1

## 2019-08-29 NOTE — Progress Notes (Signed)
Brief oncology note:  Chart has been reviewed clearing lab work.  She has persistent neutropenia and thrombocytopenia.  This is most likely due to her underlying cirrhosis and not due to treatment for her cancer.  Note that they are down slightly compared to her baseline and this is likely due to her acute illness.  We will continue to monitor her chart peripherally.  No treatment needed at this time for her neutropenia and thrombocytopenia.  Mikey Bussing, DNP, AGPCNP-BC, AOCNP Mon/Tues/Thurs/Fri 7am-5pm; Off Wednesdays Cell: [phone]

## 2019-08-29 NOTE — Evaluation (Signed)
Physical Therapy One Time Evaluation Patient Details Name: Valerie [name] MRN: [id number] DOB: 10/16/1965 Today's Date: 08/29/2019   History of Present Illness  54 year old female with past medical history for hepatocellular carcinoma and hypertension who was referred to the ED from the cancer center due to fever.  Pt admitted to the hospital with a working diagnosis SARS COVID-19 viral pneumonia, complicated with decompensated malignant ascites  Clinical Impression  Patient evaluated by Physical Therapy with no further acute PT needs identified. All education has been completed and the patient has no further questions.  Pt ambulated in room and mobilizing well.  Pt not requiring supplemental oxygen. Pt encouraged to continue ambulating in room during acute stay.  Recommended proning however pt with ascites making proning more uncomfortable/difficult. See below for any follow-up Physical Therapy or equipment needs. PT is signing off. Thank you for this referral.     Follow Up Recommendations No PT follow up    Equipment Recommendations  None recommended by PT    Recommendations for Other Services       Precautions / Restrictions Precautions Precautions: None      Mobility  Bed Mobility Overal bed mobility: Modified Independent                Transfers Overall transfer level: Modified independent                  Ambulation/Gait Ambulation/Gait assistance: Supervision;Modified independent (Device/Increase time) Gait Distance (Feet): 150 Feet Assistive device: None Gait Pattern/deviations: WFL(Within Functional Limits)     General Gait Details: pt ambulated in room, SPO2 93% on room air end of ambulation, pt reports mild dyspnea but mostly limited by coughing  Stairs            Wheelchair Mobility    Modified Rankin (Stroke Patients Only)       Balance Overall balance assessment: No apparent balance deficits (not formally assessed)                                            Pertinent Vitals/Pain Pain Assessment: No/denies pain    Home Living Family/patient expects to be discharged to:: Private residence Living Arrangements: Children           Home Layout: One level Home Equipment: None Additional Comments: BSC in pt's hospital room    Prior Function Level of Independence: Independent               Hand Dominance        Extremity/Trunk Assessment        Lower Extremity Assessment Lower Extremity Assessment: Overall WFL for tasks assessed    Cervical / Trunk Assessment Cervical / Trunk Assessment: Normal  Communication   Communication: No difficulties  Cognition Arousal/Alertness: Awake/alert Behavior During Therapy: WFL for tasks assessed/performed Overall Cognitive Status: Within Functional Limits for tasks assessed                                        General Comments      Exercises     Assessment/Plan    PT Assessment Patent does not need any further PT services  PT Problem List         PT Treatment Interventions      PT Goals (Current goals can be  found in the Care Plan section)  Acute Rehab PT Goals PT Goal Formulation: All assessment and education complete, DC therapy    Frequency     Barriers to discharge        Co-evaluation               AM-PAC PT "6 Clicks" Mobility  Outcome Measure Help needed turning from your back to your side while in a flat bed without using bedrails?: None Help needed moving from lying on your back to sitting on the side of a flat bed without using bedrails?: None Help needed moving to and from a bed to a chair (including a wheelchair)?: None Help needed standing up from a chair using your arms ([name].g., wheelchair or bedside chair)?: None Help needed to walk in hospital room?: A Little Help needed climbing 3-5 steps with a railing? : A Little 6 Click Score: 22    End of Session   Activity Tolerance:  Patient tolerated treatment well Patient left: in bed;with call bell/phone within reach Nurse Communication: Mobility status PT Visit Diagnosis: Difficulty in walking, not elsewhere classified (R26.2)    Time: 3383-2919 PT Time Calculation (min) (ACUTE ONLY): 20 min   Charges:   PT Evaluation $PT Eval Low Complexity: 1 Low     Kati PT, DPT Acute Rehabilitation Services Office: [phone]  Valerie [name] 08/29/2019, 1:07 PM

## 2019-08-29 NOTE — Progress Notes (Signed)
Lower venous duplex       has been completed. Preliminary results can be found under CV proc through chart review. June Leap, BS, RDMS, RVT

## 2019-08-29 NOTE — Progress Notes (Signed)
PROGRESS NOTE    Valerie [name]  ZOX:[id number] DOB: [birth year] DOA: 08/27/2019 PCP: Charlott Rakes, MD    Brief Narrative:  Patient admitted to the hospital with a working diagnosis  SARS COVID-19 viral pneumonia, complicated with decompensated malignant ascites.  54 year old female with past medical history for hepatocellular carcinoma and hypertension who was referred to the ED from the cancer center due to fever.  Patient reported 24 hours of fever associated with chest pain, mild cough abdominal pain and increased abdominal girth.  On her initial physical examination her blood pressure was 156/94, respiratory rate 23-30, heart rate 88, oxygen saturation 97%.  Her lungs are clear to auscultation bilaterally, heart S1-S2, present rhythmic, her abdomen had ascites, generalized tenderness, no lower extremity edema.  SARS COVID-19 positive.  Her chest radiograph had right lower lobe opacity.  CT of the abdomen with new large volume ascites, decreased right liver mass and diaphragmatic involvement, improved pulmonary nodules, groundglass opacities left lung.  Patient was placed on systemic corticosteroids, and remdesivir.  Ultrasound-guided paracentesis-yield 850 cc of bloody tinged fluid.   Assessment & Plan:   Principal Problem:   Neutropenia ([place]) Active Problems:   Thrombocytopenia ([place])   Hepatocellular carcinoma (H[facility])   Febrile neutropenia ([place])   COVID-19   1.  SARS COVID-19 viral pneumonia.  RR: 18  Pulse oxymetry: 92%  Fi02: room air.    COVID-19 Labs  Recent Labs    08/27/19 1640 08/28/19 0348 08/29/19 0430  DDIMER 13.22* 10.75* 11.27*  FERRITIN 730* 709* 575*  CRP 0.5 0.6 0.6    Lab Results  Component Value Date   SARSCOV2NAA POSITIVE (A) 08/27/2019   [place] NEGATIVE 06/29/2019   [place] NEGATIVE 06/22/2019    Inflammatory markers are stable.   Tolerating well medical therapy with remdesivir and IV dexamethasone. On airway clearing  techniques with flutter valve and incentive spirometer. Continue of bed to chair tid with meals, and physical and occupational therapy.    2. Ascites/ likely malignant/ metastatic hepatocellular carcinoma Patient sp paracentesis, 850 ml removed,  Wbc 1,171, with 77 % lymphocytes and 1% PMN.  Culture with no growth and gram stain with no organisms.   Her abdomen continue to be soft with no significant pain, continue to hold on antibiotics.   3. Pancytopenia (neutropenia and thrombocytopenia) her cell count has been stable, will follow on cell count in am. [place] lower extremities negative for DVT.  Continue to hold on anticoagulation due to thrombocytopenia.    4. Hepatitis C. Outpatient follow up.    5. HTN. Hr has been low, 55 to 58 bpm, blood pressure 154/85.  Will decrease metoprolol to 12,5 mg po bid and will continue telemetry monitoring.   6. Depression/ chronic pain. On fluoxetine. On hydromorphone for pain control.   Status is: Inpatient  Remains inpatient appropriate because:IV treatments appropriate due to intensity of illness or inability to take PO   Dispo: The patient is from: Home              Anticipated d/c is to: Home              Anticipated d/c date is: 2 days              Patient currently is not medically stable to d/c.   DVT prophylaxis: scd   Code Status:   full  Family Communication:  No family at the bedside      Nutrition Status: Nutrition Problem: Increased nutrient needs Etiology: catabolic illness, cancer and  cancer related treatments, acute illness(COVID-19 infection) Signs/Symptoms: estimated needs Interventions: Prostat, Magic cup, MVI, Other (Comment)([place])     Subjective: Patient is feeling better, no significant abdominal pain, continue to have cough dry, with no nausea or vomiting.   Objective: Vitals:   08/28/19 1000 08/28/19 1150 08/28/19 2036 08/29/19 0504  BP:  (!) 163/92 (!) 158/102 (!) 154/85  Pulse:  61 66 (!) 55    Resp:  17 18 18   Temp:  (!) 97.5 F (36.4 C) 97.7 F (36.5 C) (!) 97.5 F (36.4 C)  TempSrc:  Oral Oral Oral  SpO2: 96% 96% 93% 92%  Weight:      Height:        Intake/Output Summary (Last 24 hours) at 08/29/2019 0913 Last data filed at 08/28/2019 2000 Gross per 24 hour  Intake 1070.06 ml  Output --  Net 1070.06 ml   Filed Weights   08/27/19 0923  Weight: 55.8 kg    Examination:   General: Not in pain or dyspnea,. Deconditioned  Neurology: Awake and alert, non focal  E ENT: no pallor, no icterus, oral mucosa moist Cardiovascular: No JVD. S1-S2 present, rhythmic, no gallops, rubs, or murmurs. No lower extremity edema. Pulmonary:  Positive breath sounds bilaterally. Gastrointestinal. Abdomen with no organomegaly, non tender, no rebound or guarding Skin. No rashes Musculoskeletal: no joint deformities     Data Reviewed: I have personally reviewed following labs and imaging studies  CBC: Recent Labs  Lab 08/22/19 1215 08/27/19 0942 08/28/19 0348  WBC 3.0* 2.0* 1.9*  NEUTROABS 1.2* 1.0*  --   HGB 11.6* 12.1 12.1  HCT 36.0 36.8 36.8  MCV 98.4 98.7 97.4  PLT 44* 33* 30*   Basic Metabolic Panel: Recent Labs  Lab 08/22/19 1215 08/27/19 0942 08/28/19 0348 08/29/19 0430  NA 140 137 137 136  K 3.8 3.7 4.2 4.6  CL 108 105 107 108  CO2 26 26 22 23   GLUCOSE 83 77 149* 105*  BUN 7 8 11 15   CREATININE 0.72 0.69 0.79 0.63  CALCIUM 8.2* 8.0* 8.0* 8.1*   GFR: Estimated Creatinine Clearance: 71.6 mL/min (by C-G formula based on SCr of 0.63 mg/dL). Liver Function Tests: Recent Labs  Lab 08/22/19 1215 08/27/19 0942 08/29/19 0430  AST 92* 102* 67*  ALT 55* 54* 39  ALKPHOS 163* 122 108  BILITOT 1.4* 1.4* 1.0  PROT 6.8 6.9 6.3*  ALBUMIN 2.6* 2.8* 2.5*   Recent Labs  Lab 08/27/19 0942  LIPASE 46   No results for input(s): AMMONIA in the last 168 hours. Coagulation Profile: Recent Labs  Lab 08/27/19 0942  INR 1.5*   Cardiac Enzymes: No results for  input(s): CKTOTAL, CKMB, CKMBINDEX, TROPONINI in the last 168 hours. BNP (last 3 results) No results for input(s): PROBNP in the last 8760 hours. HbA1C: No results for input(s): HGBA1C in the last 72 hours. CBG: No results for input(s): GLUCAP in the last 168 hours. Lipid Profile: No results for input(s): CHOL, HDL, LDLCALC, TRIG, CHOLHDL, LDLDIRECT in the last 72 hours. Thyroid Function Tests: No results for input(s): TSH, T4TOTAL, FREET4, T3FREE, THYROIDAB in the last 72 hours. Anemia Panel: Recent Labs    08/28/19 0348 08/29/19 0430  FERRITIN 709* 67*      Radiology Studies: I have reviewed all of the imaging during this hospital visit personally     Scheduled Meds: . vitamin C  500 mg Oral Daily  . dexamethasone (DECADRON) injection  6 mg Intravenous Q24H  . feeding supplement (  [place] STANDARD 1.4)  325 mL Oral Q24H  . feeding supplement (PRO-STAT SUGAR FREE 64)  30 mL Oral Daily  . FLUoxetine  20 mg Oral Daily  . metoprolol tartrate  25 mg Oral BID  . multivitamin with minerals  1 tablet Oral Daily  . zinc sulfate  220 mg Oral Daily   Continuous Infusions: . remdesivir 100 mg in NS 100 mL 100 mg (08/28/19 0802)     LOS: 2 days        Winry Egnew Gerome Apley, MD

## 2019-08-29 NOTE — Progress Notes (Signed)
OT Cancellation Note  Patient Details Name: Valerie [name] MRN: [id number] DOB: 09/14/1965   Cancelled Treatment:    Reason Eval/Treat Not Completed: OT screened, no needs identified, will sign off(Per PT - patient has no needs PT or OT wise. OT will sign off. Please reconsult if patient has a change in status.)  Earl Losee L Nora Sabey 08/29/2019, 4:32 PM

## 2019-08-30 LAB — COMPREHENSIVE METABOLIC PANEL
ALT: 37 U/L (ref 0–44)
AST: 59 U/L — ABNORMAL HIGH (ref 15–41)
Albumin: 2.5 g/dL — ABNORMAL LOW (ref 3.5–5.0)
Alkaline Phosphatase: 110 U/L (ref 38–126)
Anion gap: 2 — ABNORMAL LOW (ref 5–15)
BUN: 15 mg/dL (ref 6–20)
CO2: 27 mmol/L (ref 22–32)
Calcium: 8.2 mg/dL — ABNORMAL LOW (ref 8.9–10.3)
Chloride: 107 mmol/L (ref 98–111)
Creatinine, Ser: 0.53 mg/dL (ref 0.44–1.00)
GFR calc Af Amer: >60 mL/min (ref >60)
GFR calc non Af Amer: >60 mL/min (ref >60)
Glucose, Bld: 111 mg/dL — ABNORMAL HIGH (ref 70–99)
Potassium: 4.4 mmol/L (ref 3.5–5.1)
Sodium: 136 mmol/L (ref 135–145)
Total Bilirubin: 1.2 mg/dL (ref 0.3–1.2)
Total Protein: 6.2 g/dL — ABNORMAL LOW (ref 6.5–8.1)

## 2019-08-30 LAB — D-DIMER, QUANTITATIVE: D-Dimer, Quant: 12.39 ug/mL-FEU — ABNORMAL HIGH (ref 0.00–0.50)

## 2019-08-30 LAB — C-REACTIVE PROTEIN: CRP: 0.6 mg/dL (ref <1.0)

## 2019-08-30 LAB — FERRITIN: Ferritin: 564 ng/mL — ABNORMAL HIGH (ref 11–307)

## 2019-08-30 MED ORDER — CYCLOBENZAPRINE HCL 5 MG PO TABS
5.0000 mg | ORAL_TABLET | Freq: Three times a day (TID) | ORAL | Status: DC | PRN
  Administered 2019-08-30: 5 mg via ORAL
  Filled 2019-08-30: qty 1

## 2019-08-30 MED ORDER — HYDROCHLOROTHIAZIDE 25 MG PO TABS
25.0000 mg | ORAL_TABLET | Freq: Every day | ORAL | Status: DC
  Administered 2019-08-30 – 2019-08-31 (×2): 25 mg via ORAL
  Filled 2019-08-30 (×2): qty 1

## 2019-08-30 NOTE — Progress Notes (Signed)
PROGRESS NOTE    Valerie [name]  QQV:[id number] DOB: 1965-11-24 DOA: 08/27/2019 PCP: Charlott Rakes, MD    Brief Narrative:  Patientadmitted to the hospital with a working diagnosis SARS COVID-19 viral pneumonia,complicated with decompensated malignant ascites.  54 year old female with past medical history for hepatocellular carcinoma and hypertension who was referred to the ED from the cancer center due to fever. Patient reported 24 hours of fever associated with chest pain,mild cough abdominal pain and increased abdominal girth. On her initial physical examination her blood pressure was 156/94, respiratory rate 23-30, heart rate 88, oxygen saturation 97%. Her lungs are clear to auscultation bilaterally, heart S1-S2, present rhythmic, her abdomen had ascites, generalized tenderness, no lower extremity edema. SARS COVID-19 positive. Her chest radiograph had right lower lobe opacity.CT of the abdomen with new large volume ascites, decreased right liver mass and diaphragmatic involvement, improved pulmonary nodules, groundglass opacities left lung.  Patient was placed on systemic corticosteroids, and remdesivir. Ultrasound-guided paracentesis-yield850 cc of bloody tinged fluid.   Assessment & Plan:   Principal Problem:   Neutropenia ([place]) Active Problems:   Thrombocytopenia ([place])   Hepatocellular carcinoma ([facility])   Febrile neutropenia ([place])   COVID-19   1.SARS COVID-19 viral pneumonia.  RR: 12  Pulse oxymetry: 94 Fi02: room air.  COVID-19 Labs  Recent Labs    08/28/19 0348 08/29/19 0430 08/30/19 0356  DDIMER 10.75* 11.27* 12.39*  FERRITIN 709* 575* 564*  CRP 0.6 0.6 0.6    Lab Results  Component Value Date   SARSCOV2NAA POSITIVE (A) 08/27/2019   [place] NEGATIVE 06/29/2019   [place] NEGATIVE 06/22/2019    Inflammatory markers are stable.   Continue with medical therapy with remdesivir #4/5 and IV dexamethasone. Continue with airway clearing  techniques with flutter valve and incentive spirometer.   Continue of bed to chair tid with meals, and physical and occupational therapy.    2. Ascites/ likely malignant/ metastatic hepatocellular carcinoma Patient sp paracentesis, 850 ml removed,  Wbc 1,171, with 77 % lymphocytes and 1% PMN. Culture with no growth and gram stain with no organisms. Ruled out for spontaneous bacterial peritonitis.   Continue to have soft abdomen.   3. Pancytopenia (neutropenia and thrombocytopenia. Continue to hold on anticoagulation due to thrombocytopenia.    4. Hepatitis C. Continue outpatient follow up.    5. HTN. Continue low dose of metoprolol.   6. Depression/ chronic pain. Continue with fluoxetine. and hydromorphone for pain control.   Status is: Inpatient  Remains inpatient appropriate because:IV treatments appropriate due to intensity of illness or inability to take PO   Dispo: The patient is from: Home              Anticipated d/c is to: Home              Anticipated d/c date is: 1 day              Patient currently is not medically stable to d/c.    DVT prophylaxis: Enoxaparin   Code Status:   full  Family Communication: no family at the bedside        Nutrition Status: Nutrition Problem: Increased nutrient needs Etiology: catabolic illness, cancer and cancer related treatments, acute illness(COVID-19 infection) Signs/Symptoms: estimated needs Interventions: Prostat, Magic cup, MVI, Other (Comment)([place])      Subjective: Patient is having back pain, worse with movement, improved with rest, no radiation, moderate in intensity. No nausea or vomiting, no chest pain.   Objective: Vitals:   08/29/19 0504 08/29/19  1314 08/29/19 2145 08/30/19 0632  BP: (!) 154/85 100/69 (!) 157/94 (!) 167/93  Pulse: (!) 55 69 65 (!) 56  Resp: 18 12 12 12   Temp: (!) 97.5 F (36.4 C) 97.8 F (36.6 C) 97.6 F (36.4 C) 97.6 F (36.4 C)  TempSrc: Oral Oral  Oral  SpO2: 92% 96%  95% 94%  Weight:      Height:        Intake/Output Summary (Last 24 hours) at 08/30/2019 0856 Last data filed at 08/30/2019 0009 Gross per 24 hour  Intake 480 ml  Output --  Net 480 ml   Filed Weights   08/27/19 0923  Weight: 55.8 kg    Examination:   General: Not in pain or dyspnea. Deconditioned  Neurology: Awake and alert, non focal  E ENT: mild pallor, no icterus, oral mucosa moist Cardiovascular: No JVD. S1-S2 present, rhythmic, no gallops, rubs, or murmurs. No lower extremity edema. Pulmonary: positive breath sounds bilaterally, adequate air movement, no wheezing, rhonchi or rales. Gastrointestinal. Abdomen soft with no organomegaly, non tender, no rebound or guarding Skin. No rashes Musculoskeletal: no joint deformities     Data Reviewed: I have personally reviewed following labs and imaging studies  CBC: Recent Labs  Lab 08/27/19 0942 08/28/19 0348  WBC 2.0* 1.9*  NEUTROABS 1.0*  --   HGB 12.1 12.1  HCT 36.8 36.8  MCV 98.7 97.4  PLT 33* 30*   Basic Metabolic Panel: Recent Labs  Lab 08/27/19 0942 08/28/19 0348 08/29/19 0430 08/30/19 0356  NA 137 137 136 136  K 3.7 4.2 4.6 4.4  CL 105 107 108 107  CO2 26 22 23 27   GLUCOSE 77 149* 105* 111*  BUN 8 11 15 15   CREATININE 0.69 0.79 0.63 0.53  CALCIUM 8.0* 8.0* 8.1* 8.2*   GFR: Estimated Creatinine Clearance: 71.6 mL/min (by C-G formula based on SCr of 0.53 mg/dL). Liver Function Tests: Recent Labs  Lab 08/27/19 0942 08/29/19 0430 08/30/19 0356  AST 102* 67* 59*  ALT 54* 39 37  ALKPHOS 122 108 110  BILITOT 1.4* 1.0 1.2  PROT 6.9 6.3* 6.2*  ALBUMIN 2.8* 2.5* 2.5*   Recent Labs  Lab 08/27/19 0942  LIPASE 46   No results for input(s): AMMONIA in the last 168 hours. Coagulation Profile: Recent Labs  Lab 08/27/19 0942  INR 1.5*   Cardiac Enzymes: No results for input(s): CKTOTAL, CKMB, CKMBINDEX, TROPONINI in the last 168 hours. BNP (last 3 results) No results for input(s): PROBNP in  the last 8760 hours. HbA1C: No results for input(s): HGBA1C in the last 72 hours. CBG: No results for input(s): GLUCAP in the last 168 hours. Lipid Profile: No results for input(s): CHOL, HDL, LDLCALC, TRIG, CHOLHDL, LDLDIRECT in the last 72 hours. Thyroid Function Tests: No results for input(s): TSH, T4TOTAL, FREET4, T3FREE, THYROIDAB in the last 72 hours. Anemia Panel: Recent Labs    08/29/19 0430 08/30/19 0356  FERRITIN 575* 564*      Radiology Studies: I have reviewed all of the imaging during this hospital visit personally     Scheduled Meds: . vitamin C  500 mg Oral Daily  . dexamethasone (DECADRON) injection  6 mg Intravenous Q24H  . feeding supplement ([employer] [place] STANDARD 1.4)  325 mL Oral Q24H  . feeding supplement (PRO-STAT SUGAR FREE 64)  30 mL Oral Daily  . FLUoxetine  20 mg Oral Daily  . metoprolol tartrate  12.5 mg Oral BID  . multivitamin with minerals  1 tablet  Oral Daily  . zinc sulfate  220 mg Oral Daily   Continuous Infusions: . remdesivir 100 mg in NS 100 mL 100 mg (08/29/19 1054)     LOS: 3 days        Valerie Zietlow Gerome Apley, MD \

## 2019-08-31 LAB — COMPREHENSIVE METABOLIC PANEL
ALT: 35 U/L (ref 0–44)
AST: 55 U/L — ABNORMAL HIGH (ref 15–41)
Albumin: 2.2 g/dL — ABNORMAL LOW (ref 3.5–5.0)
Alkaline Phosphatase: 109 U/L (ref 38–126)
Anion gap: 3 — ABNORMAL LOW (ref 5–15)
BUN: 14 mg/dL (ref 6–20)
CO2: 27 mmol/L (ref 22–32)
Calcium: 8.2 mg/dL — ABNORMAL LOW (ref 8.9–10.3)
Chloride: 105 mmol/L (ref 98–111)
Creatinine, Ser: 0.6 mg/dL (ref 0.44–1.00)
GFR calc Af Amer: >60 mL/min (ref >60)
GFR calc non Af Amer: >60 mL/min (ref >60)
Glucose, Bld: 87 mg/dL (ref 70–99)
Potassium: 4.2 mmol/L (ref 3.5–5.1)
Sodium: 135 mmol/L (ref 135–145)
Total Bilirubin: 1.2 mg/dL (ref 0.3–1.2)
Total Protein: 6 g/dL — ABNORMAL LOW (ref 6.5–8.1)

## 2019-08-31 LAB — D-DIMER, QUANTITATIVE: D-Dimer, Quant: 11.89 ug/mL-FEU — ABNORMAL HIGH (ref 0.00–0.50)

## 2019-08-31 LAB — FERRITIN: Ferritin: 589 ng/mL — ABNORMAL HIGH (ref 11–307)

## 2019-08-31 LAB — C-REACTIVE PROTEIN: CRP: 0.6 mg/dL (ref <1.0)

## 2019-08-31 MED ORDER — PANTOPRAZOLE SODIUM 40 MG PO TBEC: 40.0000 mg | DELAYED_RELEASE_TABLET | Freq: Every day | ORAL | Status: DC

## 2019-08-31 MED ORDER — GUAIFENESIN-DM 100-10 MG/5ML PO SYRP: 5.0000 mL | ORAL_SOLUTION | Freq: Four times a day (QID) | ORAL | 0 refills | Status: DC | PRN

## 2019-08-31 MED ORDER — HYDROCHLOROTHIAZIDE 25 MG PO TABS: 25.0000 mg | ORAL_TABLET | Freq: Every day | ORAL | 0 refills | Status: DC

## 2019-08-31 MED ORDER — ALBUTEROL SULFATE HFA 108 (90 BASE) MCG/ACT IN AERS: 1.0000 | INHALATION_SPRAY | RESPIRATORY_TRACT | Status: DC | PRN

## 2019-08-31 MED ORDER — ALBUTEROL SULFATE HFA 108 (90 BASE) MCG/ACT IN AERS: 1.0000 | INHALATION_SPRAY | RESPIRATORY_TRACT | 0 refills | Status: AC | PRN

## 2019-08-31 MED ORDER — PANTOPRAZOLE SODIUM 40 MG PO TBEC: 40.0000 mg | DELAYED_RELEASE_TABLET | Freq: Every day | ORAL | 0 refills | Status: DC

## 2019-08-31 MED ORDER — PRO-STAT SUGAR FREE PO LIQD: 30.0000 mL | Freq: Every day | ORAL | 0 refills | Status: DC

## 2019-08-31 NOTE — Plan of Care (Signed)
Discharge instructions reviewed with patient, questions answered, verbalized understanding.  Transported to main entrance via wheelchair to be taken home by daughter.

## 2019-08-31 NOTE — Discharge Summary (Addendum)
Physician Discharge Summary  Valerie [name] JOA:[id number] DOB: 23-Apr-1965 DOA: 08/27/2019  PCP: Charlott Rakes, MD  Admit date: 08/27/2019 Discharge date: 08/31/2019  Admitted From: Home  Disposition:  Home   Recommendations for Outpatient Follow-up and new medication changes:  1. Follow up with Dr. Margarita Rana in 14 days.  2. Continue self quarantine for 2 more weeks, use a mask in public and maintain physical distancing. 3. Decreased metoprolol to 12.5 mg bid due to bradycardia and started on HCTZ for better blood pressure control.  4. Patient will continue antitussive agent and bronchodilator therapy.   Home Health: no   Equipment/Devices: no    Discharge Condition: stable  CODE STATUS: DNR Diet recommendation: heart healthy diet.    Brief/Interim Summary: Patientwas admitted to the hospital with a working diagnosis SARS COVID-19 viral pneumonia,complicated with decompensated malignant ascites.  54 year old female with past medical history for hepatocellular carcinoma and hypertension who was referred to the ED from the cancer center due to fever. Patient reported 24 hours of fever associated with chest pain,mild cough, abdominal pain and increased abdominal girth. On her initial physical examination her blood pressure was 156/94, respiratory rate 23-30, heart rate 88, oxygen saturation 97%. Her lungs were clear to auscultation bilaterally, heart S1-S2, present rhythmic, her abdomen had ascites, generalized tenderness, no lower extremity edema. SARS COVID-19 positive.  Sodium 137, potassium 3.7, chloride 105, bicarb 26, glucose 77, BUN 8, creatinine 0.69, lipase 46, AST 82, ALT 54, white count 2.0, hemoglobin 12.1, hematocrit 36.8, platelets 33.  SARS COVID-19 positive.  Urine analysis specific gravity 1.016, more than 300 protein, 0-5 white cells. Her chest radiograph had right lower lobe opacity.CT of the abdomen with new large volume ascites, decreased right liver mass and  diaphragmatic involvement, improved pulmonary nodules, groundglass opacities left lung. EKG 87 bpm, normal axis, normal intervals, sinus rhythm, no ST segment or T wave changes.  Patient was placed on systemic corticosteroids, and remdesivir. Ultrasound-guided paracentesis-yield850 cc of bloody tinged fluid, negative for bacterial infection.   1.  SARS COVID-19 viral pneumonia.  Patient was admitted to the medical ward, she received supplemental oxygen per nasal cannula, medical therapy with intravenous dexamethasone and remdesivir. She also received bronchodilator therapy, antitussive agents and airway clearing techniques with incentive spirometry and flutter valve.  Her inflammatory markers, oxygenation and symptoms improved.  COVID-19 Labs  Recent Labs    08/29/19 0430 08/30/19 0356 08/31/19 0407  DDIMER 11.27* 12.39* 11.89*  FERRITIN 575* 564* 589*  CRP 0.6 0.6 0.6    Lab Results  Component Value Date   SARSCOV2NAA POSITIVE (A) 08/27/2019   [place] NEGATIVE 06/29/2019   [place] NEGATIVE 06/22/2019   2.  Ascites, likely malignant, metastatic hepatocellular carcinoma.  Patient underwent ultrasound-guided paracentesis, 820 mL of fluid was removed.  White cell count 1,171, with 77% lymphocytes and 1% polymorphonuclears, (absolute PMN 11.7) , ascitic fluid Gram stain and culture were negative for bacterial infection.  Patient ruled out for spontaneous bacterial peritonitis, antibiotic therapy was discontinued.  Patient will follow up with Dr. Benay Spice from oncology as an outpatient.  3.  Pancytopenia, neutropenia and thrombocytopenia.  Likely multifactorial, anticoagulation was held, patient will follow-up as an outpatient.  4.  Hepatitis C.  Patient will follow-up as an outpatient.  5.  Hypertension.  Due to bradycardia metoprolol was decreased to 12.5 mg twice daily, hydrochlorothiazide was added for better blood pressure control  6.  Depression/chronic pain.   Patient will continue fluoxetine and morphine.    Discharge Diagnoses:  Principal  Problem:   Pneumonia due to COVID-19 virus Active Problems:   Thrombocytopenia ([place])   Hepatocellular carcinoma ([place])   Neutropenia ([place])   Febrile neutropenia ([place])   COVID-19    Discharge Instructions   Allergies as of 08/31/2019      Reactions   Norco [hydrocodone-acetaminophen] Anaphylaxis, Shortness Of Breath   Tylenol [acetaminophen] Other (See Comments)   Cannot take due to liver      Medication List    STOP taking these medications   metoprolol tartrate 25 MG tablet Commonly known as: LOPRESSOR     TAKE these medications   albuterol 108 (90 Base) MCG/ACT inhaler Commonly known as: VENTOLIN HFA Inhale 1 puff into the lungs every 4 (four) hours as needed for wheezing or shortness of breath.   feeding supplement (PRO-STAT SUGAR FREE 64) Liqd Take 30 mLs by mouth daily.   FLUoxetine 20 MG capsule Commonly known as: PROZAC Take 1 capsule (20 mg total) by mouth daily.   guaiFENesin-dextromethorphan 100-10 MG/5ML syrup Commonly known as: ROBITUSSIN DM Take 5 mLs by mouth every 6 (six) hours as needed for cough.   hydrochlorothiazide 25 MG tablet Commonly known as: HYDRODIURIL Take 1 tablet (25 mg total) by mouth daily.   HYDROmorphone 2 MG tablet Commonly known as: Dilaudid Take 1 tablet (2 mg total) by mouth every 4 (four) hours as needed for severe pain.   ibuprofen 600 MG tablet Commonly known as: ADVIL Take 1 tablet (600 mg total) by mouth every 6 (six) hours as needed.   methocarbamol 500 MG tablet Commonly known as: ROBAXIN Take 1 tablet (500 mg total) by mouth 2 (two) times daily.   pantoprazole 40 MG tablet Commonly known as: PROTONIX Take 1 tablet (40 mg total) by mouth daily.   prochlorperazine 10 MG tablet Commonly known as: COMPAZINE Take 1 tablet (10 mg total) by mouth every 6 (six) hours as needed.   sennosides-docusate sodium 8.6-50 MG  tablet Commonly known as: SENOKOT-S Take 1-2 tablets by mouth 2 (two) times daily as needed.            Durable Medical Equipment  (From admission, onward)         Start     Ordered   08/28/19 1112  For home use only DME Bedside commode  Once    Question:  Patient needs a bedside commode to treat with the following condition  Answer:  Fear for personal safety   08/28/19 1111         Follow-up Information    [facility] Follow up.   Why: Per [facility] Patient has appointmet on 09/20/19 at 2:30 Contact information: [place] [id number] [phone]         Allergies  Allergen Reactions  . Norco [Hydrocodone-Acetaminophen] Anaphylaxis and Shortness Of Breath  . Tylenol [Acetaminophen] Other (See Comments)    Cannot take due to liver    Consultations:  Oncology    Procedures/Studies: DG Chest 2 View  Result Date: 08/27/2019 CLINICAL DATA:  Cough and shortness of breath. EXAM: CHEST - 2 VIEW COMPARISON:  06/30/2019 chest CT. FINDINGS: Pulmonary nodules that are underestimated relative to CT. Continued right lower lobe opacity and volume loss associated with large liver mass and trans diaphragmatic spread by cross-sectional imaging. No visible effusion or pneumothorax. Normal heart size. IMPRESSION: Stable malignancy associated findings when compared with recent cross-sectional imaging. Electronically Signed   By: Monte Fantasia M.D.   On: 08/27/2019 08:59  CT ABDOMEN PELVIS W CONTRAST  Result Date: 08/27/2019 CLINICAL DATA:  Upper abdominal pain and fever.  Neutropenia EXAM: CT ABDOMEN AND PELVIS WITH CONTRAST TECHNIQUE: Multidetector CT imaging of the abdomen and pelvis was performed using the standard protocol following bolus administration of intravenous contrast. CONTRAST:  138mL OMNIPAQUE IOHEXOL 300 MG/ML  SOLN COMPARISON:  06/28/2019 abdominal CT FINDINGS: Lower chest: Pulmonary nodules and band of  atelectatic opacity in the right lower lobe. Pulmonary nodules are less prominent and improved. There are a few ground-glass opacities in the left lung. Hepatobiliary: Known large (up to 8 cm) mass in the right upper liver involving the diaphragm and pressing on the hepatic cava. There is underlying cirrhosis. The mass is decreased in size with less trans diaphragmatic spread and more central cystic appearance. There is also less mass effect on the hepatic cava.No evidence of biliary obstruction or stone. Pancreas: Unremarkable. Spleen: Chronic splenomegaly. Adrenals/Urinary Tract: Negative adrenals. No hydronephrosis or stone. Unremarkable bladder. Stomach/Bowel:  No obstruction. No visible inflammation Vascular/Lymphatic: Lower periesophageal varices. Diffuse mesenteric collaterals. No acute finding. Atherosclerotic calcification of the aorta and iliacs Reproductive:Negative Other: Large volume ascites which is significantly increased from before. The fluid is generalized and low-density. No definite peritoneal nodularity when accounting for hypertrophy vessels. Musculoskeletal: No acute abnormalities. IMPRESSION: 1. New large volume ascites which is simple density. 2. Positive treatment response with decreased right liver mass and diaphragmatic involvement. Pulmonary nodules are also improved. The mass has more cavitary features centrally, but no acute hemorrhage is noted. 3. Ground-glass opacities are newly seen in the left lung, often atypical infection. Electronically Signed   By: Monte Fantasia M.D.   On: 08/27/2019 11:08   US Paracentesis  Result Date: 08/27/2019 INDICATION: Patient with history of hepatocellular carcinoma, hepatitis C, cirrhosis, ascites; request received for diagnostic and therapeutic paracentesis. EXAM: ULTRASOUND GUIDED DIAGNOSTIC AND THERAPEUTIC PARACENTESIS MEDICATIONS: None COMPLICATIONS: None immediate. PROCEDURE: Informed written consent was obtained from the patient after a  discussion of the risks, benefits and alternatives to treatment. A timeout was performed prior to the initiation of the procedure. Initial ultrasound scanning demonstrates a small amount of ascites within the right upper to mid abdominal quadrant. The right upper to mid abdomen was prepped and draped in the usual sterile fashion. 1% lidocaine was used for local anesthesia. Following this, a 6 Fr Safe-T-Centesis catheter was introduced. An ultrasound image was saved for documentation purposes. The paracentesis was performed. The catheter was removed and a dressing was applied. The patient tolerated the procedure well without immediate post procedural complication. FINDINGS: A total of approximately 850 cc of blood tinged fluid was removed. Samples were sent to the laboratory as requested by the clinical team. IMPRESSION: Successful ultrasound-guided diagnostic and therapeutic paracentesis yielding 850 cc of peritoneal fluid. Read by: Rowe Robert, PA-C Electronically Signed   By: Sandi Mariscal M.D.   On: 08/27/2019 14:52   DG Chest Port 1 View  Result Date: 08/27/2019 CLINICAL DATA:  Chest pain, shortness of breath. EXAM: PORTABLE CHEST 1 VIEW COMPARISON:  Aug 27, 2019. June 30, 2019. FINDINGS: Stable cardiomediastinal silhouette. No pneumothorax is noted. Stable right basilar opacity is noted consistent with liver mass and possible trans diaphragmatic spread. No significant pleural effusion is noted. Bony thorax is unremarkable. IMPRESSION: Stable right basilar opacity is noted consistent with liver mass and trans diaphragmatic spread based on prior CT scan. No significant pleural effusion is noted. Electronically Signed   By: Marijo Conception M.D.   On: 08/27/2019  09:55   VAS [place] LOWER EXTREMITY VENOUS (DVT)  Result Date: 08/29/2019  Lower Venous DVTStudy Indications: Pain.  Comparison Study: none Performing Technologist: June Leap RDMS, RVT  Examination Guidelines: A complete evaluation includes B-mode  imaging, spectral Doppler, color Doppler, and power Doppler as needed of all accessible portions of each vessel. Bilateral testing is considered an integral part of a complete examination. Limited examinations for reoccurring indications may be performed as noted. The reflux portion of the exam is performed with the patient in reverse Trendelenburg.  +---------+---------------+---------+-----------+----------+--------------+ RIGHT    CompressibilityPhasicitySpontaneityPropertiesThrombus Aging +---------+---------------+---------+-----------+----------+--------------+ CFV      Full           Yes      Yes                                 +---------+---------------+---------+-----------+----------+--------------+ SFJ      Full                                                        +---------+---------------+---------+-----------+----------+--------------+ FV Prox  Full                                                        +---------+---------------+---------+-----------+----------+--------------+ FV Mid   Full                                                        +---------+---------------+---------+-----------+----------+--------------+ FV DistalFull                                                        +---------+---------------+---------+-----------+----------+--------------+ PFV      Full                                                        +---------+---------------+---------+-----------+----------+--------------+ POP      Full           Yes      Yes                                 +---------+---------------+---------+-----------+----------+--------------+ PTV      Full                                                        +---------+---------------+---------+-----------+----------+--------------+ PERO     Full                                                        +---------+---------------+---------+-----------+----------+--------------+    +---------+---------------+---------+-----------+----------+--------------+  LEFT     CompressibilityPhasicitySpontaneityPropertiesThrombus Aging +---------+---------------+---------+-----------+----------+--------------+ CFV      Full           Yes      Yes                                 +---------+---------------+---------+-----------+----------+--------------+ SFJ      Full                                                        +---------+---------------+---------+-----------+----------+--------------+ FV Prox  Full                                                        +---------+---------------+---------+-----------+----------+--------------+ FV Mid   Full                                                        +---------+---------------+---------+-----------+----------+--------------+ FV DistalFull                                                        +---------+---------------+---------+-----------+----------+--------------+ PFV      Full                                                        +---------+---------------+---------+-----------+----------+--------------+ POP      Full           Yes      Yes                                 +---------+---------------+---------+-----------+----------+--------------+ PTV      Full                                                        +---------+---------------+---------+-----------+----------+--------------+ PERO     Full                                                        +---------+---------------+---------+-----------+----------+--------------+     Summary: BILATERAL: - No evidence of deep vein thrombosis seen in the lower extremities, bilaterally. -No evidence of popliteal cyst, bilaterally.   *See table(s) above for measurements and observations. Electronically signed by Monica Martinez MD on 08/29/2019 at 5:24:56 PM.  Final       Procedures: US paracentesis    Subjective: Patient is feeling better, no nausea or vomiting, dyspnea and cough improving, tolerating po well.   Discharge Exam: Vitals:   08/30/19 2047 08/31/19 0540  BP: (!) 177/100 (!) 174/99  Pulse: 67 (!) 56  Resp: 20 19  Temp: 98.6 F (37 C) 97.8 F (36.6 C)  SpO2: 95% 95%   Vitals:   08/30/19 1238 08/30/19 1300 08/30/19 2047 08/31/19 0540  BP: (!) 181/109 (!) 180/90 (!) 177/100 (!) 174/99  Pulse: 60  67 (!) 56  Resp: 20  20 19   Temp: 97.8 F (36.6 C)  98.6 F (37 C) 97.8 F (36.6 C)  TempSrc: Oral  Oral Oral  SpO2: 96%  95% 95%  Weight:      Height:        General: Not in pain or dyspnea.  Neurology: Awake and alert, non focal  E ENT: no pallor, no icterus, oral mucosa moist Cardiovascular: No JVD. S1-S2 present, rhythmic, no gallops, rubs, or murmurs. No lower extremity edema. Pulmonary: vesicular breath sounds bilaterally. Gastrointestinal. Abdomen soft with no organomegaly, non tender, no rebound or guarding Skin. No rashes Musculoskeletal: no joint deformities   The results of significant diagnostics from this hospitalization (including imaging, microbiology, ancillary and laboratory) are listed below for reference.     Microbiology: Recent Results (from the past 240 hour(s))  Blood Culture (routine x 2)     Status: None (Preliminary result)   Collection Time: 08/27/19  9:43 AM   Specimen: BLOOD  Result Value Ref Range Status   Specimen Description   Final    BLOOD LEFT ANTECUBITAL Performed at [facility] [address], [place], [place] [address]    Special Requests   Final    BOTTLES DRAWN AEROBIC AND ANAEROBIC Blood Culture results may not be optimal due to an excessive volume of blood received in culture bottles Performed at [facility] [address], [place], [place] [address]    Culture   Final    NO GROWTH 4 DAYS Performed at [facility], [place] [address], [place], [place] [address]     Report Status PENDING  Incomplete  Blood Culture (routine x 2)     Status: None (Preliminary result)   Collection Time: 08/27/19 10:00 AM   Specimen: BLOOD  Result Value Ref Range Status   Specimen Description   Final    BLOOD RIGHT ANTECUBITAL Performed at [facility] [address], [place], [place] [address]    Special Requests   Final    BOTTLES DRAWN AEROBIC AND ANAEROBIC Blood Culture results may not be optimal due to an excessive volume of blood received in culture bottles Performed at [facility] [address] [place], [place] [address]    Culture   Final    NO GROWTH 4 DAYS Performed at [facility], [place] [address], [place], [place] [address]    Report Status PENDING  Incomplete  Urine culture     Status: Abnormal   Collection Time: 08/27/19 10:16 AM   Specimen: In/Out Cath Urine  Result Value Ref Range Status   Specimen Description   Final    IN/OUT CATH URINE Performed at [facility] [address] [place], [place] [address]    Special Requests   Final    NONE Performed at [facility], [place] [address] [place], [place]  [address]    Culture (A)  Final    5,000 COLONIES/mL GROUP A STREP (S.PYOGENES) ISOLATED WITHIN MIXED ORGANISMS Beta hemolytic streptococci are predictably susceptible to penicillin and other beta lactams. Susceptibility testing not routinely performed. Performed at [facility], [place] [address] [place], [place] [address]    Report Status 08/28/2019 FINAL  Final  SARS Coronavirus 2 by RT PCR (hospital order, performed in [facility] hospital lab) Nasopharyngeal Urine, Clean Catch     Status: Abnormal   Collection Time: 08/27/19 10:16 AM   Specimen: Urine, Clean Catch; Nasopharyngeal  Result Value Ref Range Status   SARS Coronavirus 2 POSITIVE (A) NEGATIVE Final    Comment: RESULT CALLED TO, READ BACK BY AND VERIFIED WITH: M.JOSEPH AT 0093 ON 08/27/19 BY  N.THOMPSON (NOTE) SARS-CoV-2 target nucleic acids are DETECTED SARS-CoV-2 RNA is generally detectable in upper respiratory specimens  during the acute phase of infection.  Positive results are indicative  of the presence of the identified virus, but do not rule out bacterial infection or co-infection with other pathogens not detected by the test.  Clinical correlation with patient history and  other diagnostic information is necessary to determine patient infection status.  The expected result is negative. Fact Sheet for Patients:   StrictlyIdeas.no  Fact Sheet for Healthcare Providers:   BankingDealers.co.za   This test is not yet approved or cleared by the [place] FDA and  has been authorized for detection and/or diagnosis of SARS-CoV-2 by FDA under an Emergency Use Authorization (EUA).  This EUA will remain in effect (meaning this tes t can be used) for the duration of  the COVID-19 declaration under Section 564(b)(1) of the Act, 21 U.S.C. section 360-bbb-3(b)(1), unless the authorization is terminated or revoked sooner. Performed at [facility], [place] [address] [place], [place] [address]   Culture, body fluid-bottle     Status: None (Preliminary result)   Collection Time: 08/27/19  3:04 PM   Specimen: Fluid  Result Value Ref Range Status   Specimen Description FLUID PERITONEAL  Final   Special Requests BOTTLES DRAWN AEROBIC AND ANAEROBIC 10CC  Final   Culture   Final    NO GROWTH 4 DAYS Performed at [facility], [place] [address] [place], [place] [address]    Report Status PENDING  Incomplete  Gram stain     Status: None   Collection Time: 08/27/19  3:04 PM   Specimen: Fluid  Result Value Ref Range Status   Specimen Description FLUID PERITONEAL  Final   Special Requests NONE  Final   Gram Stain   Final    FEW WBC PRESENT, PREDOMINANTLY MONONUCLEAR NO ORGANISMS SEEN Performed at [facility], [place] [address], [place], [place] [address]    Report Status 08/27/2019 FINAL  Final     Labs: BNP (last 3 results) No results for input(s): BNP in the last 8760 hours. Basic Metabolic Panel: Recent Labs  Lab 08/27/19 0942 08/28/19 0348 08/29/19 0430 08/30/19 0356 08/31/19 0407  NA 137 137 136 136 135  K 3.7 4.2 4.6 4.4 4.2  CL 105 107 108 107 105  CO2 26 22 23 27 27   GLUCOSE 77 149* 105* 111* 87  BUN 8 11 15 15 14   CREATININE 0.69 0.79 0.63 0.53 0.60  CALCIUM 8.0* 8.0* 8.1* 8.2* 8.2*   Liver Function Tests: Recent Labs  Lab 08/27/19 0942 08/29/19 0430 08/30/19 0356 08/31/19 0407  AST 102* 67* 59* 55*  ALT  54* 39 37 35  ALKPHOS 122 108 110 109  BILITOT 1.4* 1.0 1.2 1.2  PROT 6.9 6.3* 6.2* 6.0*  ALBUMIN 2.8* 2.5* 2.5* 2.2*   Recent Labs  Lab 08/27/19 0942  LIPASE 46   No results for input(s): AMMONIA in the last 168 hours. CBC: Recent Labs  Lab 08/27/19 0942 08/28/19 0348  WBC 2.0* 1.9*  NEUTROABS 1.0*  --   HGB 12.1 12.1  HCT 36.8 36.8  MCV 98.7 97.4  PLT 33* 30*   Cardiac Enzymes: No results for input(s): CKTOTAL, CKMB, CKMBINDEX, TROPONINI in the last 168 hours. BNP: Invalid input(s): POCBNP CBG: No results for input(s): GLUCAP in the last 168 hours. D-Dimer Recent Labs    08/30/19 0356 08/31/19 0407  DDIMER 12.39* 11.89*   Hgb A1c No results for input(s): HGBA1C in the last 72 hours. Lipid Profile No results for input(s): CHOL, HDL, LDLCALC, TRIG, CHOLHDL, LDLDIRECT in the last 72 hours. Thyroid function studies No results for input(s): TSH, T4TOTAL, T3FREE, THYROIDAB in the last 72 hours.  Invalid input(s): FREET3 Anemia work up Recent Labs    08/30/19 0356 08/31/19 0407  FERRITIN 564* 589*   Urinalysis    Component Value Date/Time   COLORURINE YELLOW 08/27/2019 [place] 08/27/2019 1016   LABSPEC 1.016 08/27/2019 1016   PHURINE 6.0 08/27/2019 1016   GLUCOSEU NEGATIVE 08/27/2019 1016   HGBUR  MODERATE (A) 08/27/2019 1016   BILIRUBINUR NEGATIVE 08/27/2019 1016   KETONESUR NEGATIVE 08/27/2019 1016   PROTEINUR >=300 (A) 08/27/2019 1016   NITRITE NEGATIVE 08/27/2019 1016   LEUKOCYTESUR NEGATIVE 08/27/2019 1016   Sepsis Labs Invalid input(s): PROCALCITONIN,  WBC,  LACTICIDVEN Microbiology Recent Results (from the past 240 hour(s))  Blood Culture (routine x 2)     Status: None (Preliminary result)   Collection Time: 08/27/19  9:43 AM   Specimen: BLOOD  Result Value Ref Range Status   Specimen Description   Final    BLOOD LEFT ANTECUBITAL Performed at [facility], [place] [address], [place], [place] [address]    Special Requests   Final    BOTTLES DRAWN AEROBIC AND ANAEROBIC Blood Culture results may not be optimal due to an excessive volume of blood received in culture bottles Performed at [facility] [address], [place], [place] [address]    Culture   Final    NO GROWTH 4 DAYS Performed at [facility], [place] [address] [place], [place] [address]    Report Status PENDING  Incomplete  Blood Culture (routine x 2)     Status: None (Preliminary result)   Collection Time: 08/27/19 10:00 AM   Specimen: BLOOD  Result Value Ref Range Status   Specimen Description   Final    BLOOD RIGHT ANTECUBITAL Performed at [facility] [address], [place], [place] [address]    Special Requests   Final    BOTTLES DRAWN AEROBIC AND ANAEROBIC Blood Culture results may not be optimal due to an excessive volume of blood received in culture bottles Performed at [facility] [address] [place], [place] [address]    Culture   Final    NO GROWTH 4 DAYS Performed at [facility], [place] [address], [place], [place] [address]    Report Status PENDING  Incomplete  Urine culture     Status: Abnormal   Collection Time: 08/27/19 10:16 AM   Specimen: In/Out Cath Urine  Result Value  Ref Range Status    Specimen Description   Final    IN/OUT CATH URINE Performed at [facility] [address] [place], [place] [address]    Special Requests   Final    NONE Performed at [facility], [place] [address] [place], [place] [address]    Culture (A)  Final    5,000 COLONIES/mL GROUP A STREP (S.PYOGENES) ISOLATED WITHIN MIXED ORGANISMS Beta hemolytic streptococci are predictably susceptible to penicillin and other beta lactams. Susceptibility testing not routinely performed. Performed at [facility], [place] [address] [place], [place] [address]    Report Status 08/28/2019 FINAL  Final  SARS Coronavirus 2 by RT PCR (hospital order, performed in [facility] hospital lab) Nasopharyngeal Urine, Clean Catch     Status: Abnormal   Collection Time: 08/27/19 10:16 AM   Specimen: Urine, Clean Catch; Nasopharyngeal  Result Value Ref Range Status   SARS Coronavirus 2 POSITIVE (A) NEGATIVE Final    Comment: RESULT CALLED TO, READ BACK BY AND VERIFIED WITH: M.JOSEPH AT 2505 ON 08/27/19 BY N.THOMPSON (NOTE) SARS-CoV-2 target nucleic acids are DETECTED SARS-CoV-2 RNA is generally detectable in upper respiratory specimens  during the acute phase of infection.  Positive results are indicative  of the presence of the identified virus, but do not rule out bacterial infection or co-infection with other pathogens not detected by the test.  Clinical correlation with patient history and  other diagnostic information is necessary to determine patient infection status.  The expected result is negative. Fact Sheet for Patients:   StrictlyIdeas.no  Fact Sheet for Healthcare Providers:   BankingDealers.co.za   This test is not yet approved or cleared by the [place] FDA and  has been authorized for detection and/or diagnosis of SARS-CoV-2 by FDA under an Emergency Use Authorization (EUA).  This EUA will remain in effect  (meaning this tes t can be used) for the duration of  the COVID-19 declaration under Section 564(b)(1) of the Act, 21 U.S.C. section 360-bbb-3(b)(1), unless the authorization is terminated or revoked sooner. Performed at [facility], [place] [address] [place], [place] [address]   Culture, body fluid-bottle     Status: None (Preliminary result)   Collection Time: 08/27/19  3:04 PM   Specimen: Fluid  Result Value Ref Range Status   Specimen Description FLUID PERITONEAL  Final   Special Requests BOTTLES DRAWN AEROBIC AND ANAEROBIC 10CC  Final   Culture   Final    NO GROWTH 4 DAYS Performed at [facility], [place] [address], [place], [place] [address]    Report Status PENDING  Incomplete  Gram stain     Status: None   Collection Time: 08/27/19  3:04 PM   Specimen: Fluid  Result Value Ref Range Status   Specimen Description FLUID PERITONEAL  Final   Special Requests NONE  Final   Gram Stain   Final    FEW WBC PRESENT, PREDOMINANTLY MONONUCLEAR NO ORGANISMS SEEN Performed at [facility], [place] [address], [place], [place] [address]    Report Status 08/27/2019 FINAL  Final     Time coordinating discharge: 45 minutes  SIGNED:   Tawni Millers, MD  [facility] 08/31/2019, 8:32 AM

## 2019-09-01 LAB — CULTURE, BODY FLUID W GRAM STAIN -BOTTLE: Culture: NO GROWTH

## 2019-09-01 LAB — CULTURE, BLOOD (ROUTINE X 2)
Culture: NO GROWTH
Culture: NO GROWTH

## 2019-09-02 ENCOUNTER — Telehealth: Payer: Self-pay | Admitting: *Deleted

## 2019-09-02 ENCOUNTER — Telehealth: Payer: Self-pay | Admitting: Oncology

## 2019-09-02 ENCOUNTER — Telehealth: Payer: Self-pay

## 2019-09-02 ENCOUNTER — Other Ambulatory Visit: Payer: Self-pay | Admitting: Oncology

## 2019-09-02 MED ORDER — HYDROMORPHONE HCL 2 MG PO TABS: 2.0000 mg | ORAL_TABLET | ORAL | 0 refills | Status: DC | PRN

## 2019-09-02 NOTE — Telephone Encounter (Signed)
Requesting refill on pain medication-reports she is out. Also asking who is to refill her prozac, since she is running low? Noted she has appointment w/Dr. Antony Blackbird on 09/20/19 (PCP).

## 2019-09-02 NOTE — Telephone Encounter (Signed)
Does her appt occur before she runs out of medications and does she have someone to pick-up her medications when refilled?

## 2019-09-02 NOTE — Telephone Encounter (Signed)
Transition Care Management Follow-up Telephone Call  Date of discharge and from where: 08/31/2019, [facility]   How have you been since you were released from the hospital? She was concerned about getting a refill for her fluoxetine and dilaudid. She also explained that she spoke to Dr Satira Sark because she is feeling weak and he has instructed her to stay quarantined.  She also asked for a refill of her dilaudid and was told that Dr Benay Spice would refill that for her. She said that she needs the refill of fluoxetine from her PCP. She has enough for 2 more days.    Any questions or concerns? Noted above.  She also explained that she has been approved for full medicaid starting 12/2019.   Items Reviewed:  Did the pt receive and understand the discharge instructions provided? yes,   Medications obtained and verified?medication list reviewed in detail.  She needs to pick up the albuterol inhaler. She does not take ibuprofen.   She does not have the robitussin, Ensure, Hydrochlorothiazide, methocarbamol, pantoprazole or compazine. She needs to call [facility] to check on the status of these medications.  She said she needs a refill of her fluoxetine.    She stated that she  was instructed to take her  metoprolol 12.5 mg twice daily, which is noted in the provider's discharge summary,  yet her discharge AVS indicates to stop the metoprolol. Informed her that Dr Chapman Fitch would be notified of this discrepancy.   Any new allergies since your discharge?  none reported   Do you have support at home? yes, her daughter  Other (ie: DME, Home Health, etc) no home health ordered.   Has bedside commode.   She understands that she needs to remain quarantined for 2 more weeks. She said that she has been staying in her room and is trying to stay hydrated.   Functional Questionnaire: (I = Independent and D = Dependent) ADL's: daughter can assist if needed   Follow up appointments reviewed:    PCP  Hospital f/u appt confirmed?Dr Chapman Fitch- 09/06/2019, [facility] f/u appt confirmed? .oncology - 09/13/2019  Are transportation arrangements needed?  No, she has transportation  If their condition worsens, is the pt aware to call  their PCP or go to the ED? yes  Was the patient provided with contact information for the PCP's office or ED? yes, she has the phone number for the clinic  Was the pt encouraged to call back with questions or concerns?  yes

## 2019-09-02 NOTE — Telephone Encounter (Signed)
Scheduled appt per 5/21 sch message - left message for appt date and time

## 2019-09-03 ENCOUNTER — Telehealth: Payer: Self-pay

## 2019-09-03 ENCOUNTER — Other Ambulatory Visit: Payer: Self-pay | Admitting: Family Medicine

## 2019-09-03 DIAGNOSIS — F411 Generalized anxiety disorder: Secondary | ICD-10-CM

## 2019-09-03 MED ORDER — FLUOXETINE HCL 20 MG PO CAPS: 20.0000 mg | ORAL_CAPSULE | Freq: Every day | ORAL | 0 refills | Status: DC

## 2019-09-03 NOTE — Progress Notes (Signed)
Patient ID: Valerie [name], female   DOB: 07-Mar-1966, 54 y.o.   MRN: [id number]   Prescription sent to [facility] for refill of patient's medication as she will run out of medication prior to her upcoming visit.

## 2019-09-03 NOTE — Telephone Encounter (Signed)
Let patient know that fluoxetine was sent to [facility] so that she does not run out prior to her appointment

## 2019-09-03 NOTE — Telephone Encounter (Signed)
Which pharmacy does she need the fluoxetine refill sent to

## 2019-09-03 NOTE — Telephone Encounter (Signed)
Call placed to patient regarding her refill of fluoxetine.  She said that she does not have enough to last until her appointment on 09/06/2019.  Her daughter can pick up the refill.  She said that her daughter picked up the dilaudid today from her pharmacy.  The patient spoke about her anxiety and depression. She said that she has nothing to do except sit around and think,  She explained that she had an emotional breakdown today and had a difficult time controlling her crying. She said that she gets scared from her pain. She saw her new grand babies and just wants to be here.  She is also anxious about moving. She said that there is moisture and black mold in her home and she is anxious to move but is not sure how she can afford a new home without a job.  Her daughter is living with her will move with her.  She said that the housing coalition has a $1500 grant for her to put towards her housing but she still needs to make payments for the rent.  She will not start to receive her disability, $906/month, until 12/2019.  She said that the city has been out to inspect the home and it may be condemned.  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 She currently has an attorney helping with the housing issues. She said that she will speak to Dr Chapman Fitch about her anxiety at her upcoming appointment.  She is also interested in speaking to Christa See, [place].

## 2019-09-03 NOTE — Telephone Encounter (Addendum)
Informed her that MD sent in her pain med script yesterday and that her Prozac needs to come from her PCP. Reminded her of appointments at [facility] on 09/16/19. Also reports having ~ 6 loose stools/day and asking what to take. Instructed her to take Imodium 2-4 mg qid (max 8/day) and call if this does not help.

## 2019-09-04 NOTE — Telephone Encounter (Signed)
Call placed to patient and informed her that Dr Chapman Fitch sent her prescription for fluoxetine to [facility] and she said she understood

## 2019-09-05 DIAGNOSIS — J1282 Pneumonia due to coronavirus disease 2019: Secondary | ICD-10-CM

## 2019-09-06 ENCOUNTER — Encounter (HOSPITAL_COMMUNITY): Payer: Self-pay | Admitting: Emergency Medicine

## 2019-09-06 ENCOUNTER — Other Ambulatory Visit: Payer: Self-pay

## 2019-09-06 ENCOUNTER — Emergency Department (HOSPITAL_COMMUNITY): Payer: Medicaid Other

## 2019-09-06 ENCOUNTER — Emergency Department (HOSPITAL_COMMUNITY)
Admission: EM | Admit: 2019-09-06 | Discharge: 2019-09-06 | Disposition: A | Discharge status: Home / Self Care | Payer: Medicaid Other | Attending: Emergency Medicine | Admitting: Emergency Medicine

## 2019-09-06 ENCOUNTER — Ambulatory Visit: Payer: Medicaid Other | Admitting: Family Medicine

## 2019-09-06 DIAGNOSIS — C7800 Secondary malignant neoplasm of unspecified lung: Secondary | ICD-10-CM | POA: Insufficient documentation

## 2019-09-06 DIAGNOSIS — Z8616 Personal history of COVID-19: Secondary | ICD-10-CM | POA: Diagnosis not present

## 2019-09-06 DIAGNOSIS — R252 Cramp and spasm: Secondary | ICD-10-CM | POA: Insufficient documentation

## 2019-09-06 DIAGNOSIS — F17210 Nicotine dependence, cigarettes, uncomplicated: Secondary | ICD-10-CM | POA: Diagnosis not present

## 2019-09-06 DIAGNOSIS — C228 Malignant neoplasm of liver, primary, unspecified as to type: Secondary | ICD-10-CM | POA: Diagnosis not present

## 2019-09-06 DIAGNOSIS — R509 Fever, unspecified: Secondary | ICD-10-CM | POA: Diagnosis present

## 2019-09-06 DIAGNOSIS — Z79899 Other long term (current) drug therapy: Secondary | ICD-10-CM | POA: Insufficient documentation

## 2019-09-06 LAB — COMPREHENSIVE METABOLIC PANEL
ALT: 31 U/L (ref 0–44)
AST: 65 U/L — ABNORMAL HIGH (ref 15–41)
Albumin: 2.5 g/dL — ABNORMAL LOW (ref 3.5–5.0)
Alkaline Phosphatase: 123 U/L (ref 38–126)
Anion gap: 7 (ref 5–15)
BUN: 7 mg/dL (ref 6–20)
CO2: 25 mmol/L (ref 22–32)
Calcium: 8 mg/dL — ABNORMAL LOW (ref 8.9–10.3)
Chloride: 100 mmol/L (ref 98–111)
Creatinine, Ser: 0.74 mg/dL (ref 0.44–1.00)
GFR calc Af Amer: >60 mL/min (ref >60)
GFR calc non Af Amer: >60 mL/min (ref >60)
Glucose, Bld: 107 mg/dL — ABNORMAL HIGH (ref 70–99)
Potassium: 4.3 mmol/L (ref 3.5–5.1)
Sodium: 132 mmol/L — ABNORMAL LOW (ref 135–145)
Total Bilirubin: 1.2 mg/dL (ref 0.3–1.2)
Total Protein: 6.3 g/dL — ABNORMAL LOW (ref 6.5–8.1)

## 2019-09-06 LAB — CBC WITH DIFFERENTIAL/PLATELET
Abs Immature Granulocytes: 0 10*3/uL (ref 0.00–0.07)
Basophils Absolute: 0 10*3/uL (ref 0.0–0.1)
Basophils Relative: 0 %
Eosinophils Absolute: 0.1 10*3/uL (ref 0.0–0.5)
Eosinophils Relative: 2 %
HCT: 33.6 % — ABNORMAL LOW (ref 36.0–46.0)
Hemoglobin: 11.2 g/dL — ABNORMAL LOW (ref 12.0–15.0)
Immature Granulocytes: 0 %
Lymphocytes Relative: 36 %
Lymphs Abs: 1.4 10*3/uL (ref 0.7–4.0)
MCH: 32.2 pg (ref 26.0–34.0)
MCHC: 33.3 g/dL (ref 30.0–36.0)
MCV: 96.6 fL (ref 80.0–100.0)
Monocytes Absolute: 0.5 10*3/uL (ref 0.1–1.0)
Monocytes Relative: 13 %
Neutro Abs: 1.9 10*3/uL (ref 1.7–7.7)
Neutrophils Relative %: 49 %
Platelets: 40 10*3/uL — ABNORMAL LOW (ref 150–400)
RBC: 3.48 MIL/uL — ABNORMAL LOW (ref 3.87–5.11)
RDW: 14.3 % (ref 11.5–15.5)
WBC: 3.9 10*3/uL — ABNORMAL LOW (ref 4.0–10.5)
nRBC: 0 % (ref 0.0–0.2)

## 2019-09-06 LAB — LACTIC ACID, PLASMA: Lactic Acid, Venous: 1.2 mmol/L (ref 0.5–1.9)

## 2019-09-06 MED ORDER — KETOROLAC TROMETHAMINE 30 MG/ML IJ SOLN
15.0000 mg | Freq: Once | INTRAMUSCULAR | Status: AC
  Administered 2019-09-06: 15 mg via INTRAVENOUS
  Filled 2019-09-06: qty 1

## 2019-09-06 MED ORDER — LACTATED RINGERS IV BOLUS
1000.0000 mL | Freq: Once | INTRAVENOUS | Status: AC
  Administered 2019-09-06: 1000 mL via INTRAVENOUS

## 2019-09-06 NOTE — ED Provider Notes (Signed)
[facility] DEPT Provider Note   CSN: [id number] Arrival date & time: 09/06/19  4098     History Chief Complaint  Patient presents with  . Fever    Valerie [name] is a 54 y.o. female.  HPI    Patient with multiple medical issues including ongoing chemotherapy for liver cancer with metastases to the lung, and recent admission for Covid now presents with concern for fever, cramping sensation in her hands and feet. Patient notes that since discharge she been recovering generally well until a few hours prior to ED arrival. 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  She took her temperature found to be elevated and called her oncologist. On-call physician suggest that she come to the emergency department for evaluation.  She denies other focal complaints, nausea, vomiting, anorexia, diarrhea, focal pain.  Since discharge no recent medication change, diet change, activity change.     Past Medical History:  Diagnosis Date  . Bipolar disorder ([place])   . Hepatitis C   . Liver cirrhosis ([place])   . Pseudoseizures     Patient Active Problem List   Diagnosis Date Noted  . Pneumonia due to COVID-19 virus 09/05/2019  . Neutropenia ([place]) 08/27/2019  . Febrile neutropenia ([place]) 08/27/2019  . COVID-19 08/27/2019  . Encounter for antineoplastic immunotherapy 07/12/2019  . Encounter for antineoplastic chemotherapy 07/12/2019  . Hepatocellular carcinoma ([place]) 07/04/2019  . Goals of care, counseling/discussion 07/04/2019  . Liver mass   . Fever 06/29/2019  . SIRS (systemic inflammatory response syndrome) ([place]) 06/29/2019  . Abdominal pain 06/29/2019  . Hepatitis C 11/27/2016  . Thrombocytopenia ([place]) 11/27/2016  . Liver cirrhosis ([place]) 11/27/2016  . Elevated liver enzymes 11/27/2016  . Migraine 11/27/2016  . Hemiplegic migraine 11/27/2016  . Marijuana abuse 11/27/2016  . Normocytic  anemia 11/27/2016  . Right sided weakness 11/26/2016    Past Surgical History:  Procedure Laterality Date  . CESAREAN SECTION       OB History   No obstetric history on file.     Family History  Problem Relation Age of Onset  . Heart disease Neg Hx     Social History   Tobacco Use  . Smoking status: Current Every Day Smoker    Packs/day: 0.50    Types: Cigarettes  . Smokeless tobacco: Never Used  Substance Use Topics  . Alcohol use: No  . Drug use: No    Home Medications Prior to Admission medications   Medication Sig Start Date End Date Taking? Authorizing Provider  albuterol (VENTOLIN HFA) 108 (90 Base) MCG/ACT inhaler Inhale 1 puff into the lungs every 4 (four) hours as needed for wheezing or shortness of breath. 08/31/19   Arrien, Jimmy Picket, MD  Amino Acids-Protein Hydrolys (FEEDING SUPPLEMENT, PRO-STAT SUGAR FREE 64,) LIQD Take 30 mLs by mouth daily. 08/31/19 09/30/19  Arrien, Jimmy Picket, MD  FLUoxetine (PROZAC) 20 MG capsule Take 1 capsule (20 mg total) by mouth daily. 09/03/19 11/02/19  Fulp, Cammie, MD  guaiFENesin-dextromethorphan (ROBITUSSIN DM) 100-10 MG/5ML syrup Take 5 mLs by mouth every 6 (six) hours as needed for cough. 08/31/19   Arrien, Jimmy Picket, MD  hydrochlorothiazide (HYDRODIURIL) 25 MG tablet Take 1 tablet (25 mg total) by mouth daily. 08/31/19 09/30/19  Arrien, Jimmy Picket, MD  HYDROmorphone (DILAUDID) 2 MG tablet Take 1 tablet (2 mg total) by mouth every 4 (four) hours as needed for severe pain. 09/02/19   Betsy Coder  B, MD  ibuprofen (ADVIL) 600 MG tablet Take 1 tablet (600 mg total) by mouth every 6 (six) hours as needed. 06/22/19   Charlann Lange, PA-C  loperamide (IMODIUM A-D) 2 MG tablet Take 2-4 mg by mouth 4 (four) times daily as needed.    [provider]  methocarbamol (ROBAXIN) 500 MG tablet Take 1 tablet (500 mg total) by mouth 2 (two) times daily. 06/22/19   Charlann Lange, PA-C  pantoprazole (PROTONIX) 40 MG tablet  Take 1 tablet (40 mg total) by mouth daily. 08/31/19 09/30/19  Arrien, Jimmy Picket, MD  prochlorperazine (COMPAZINE) 10 MG tablet Take 1 tablet (10 mg total) by mouth every 6 (six) hours as needed. 07/12/19   Heilingoetter, Cassandra L, PA-C  sennosides-docusate sodium (SENOKOT-S) 8.6-50 MG tablet Take 1-2 tablets by mouth 2 (two) times daily as needed. 08/02/19   [provider]    Allergies    Norco [hydrocodone-acetaminophen] and Tylenol [acetaminophen]  Review of Systems   Review of Systems  Constitutional:       Per HPI, otherwise negative  HENT:       Per HPI, otherwise negative  Respiratory:       Per HPI, otherwise negative  Cardiovascular:       Per HPI, otherwise negative  Gastrointestinal: Negative for vomiting.  Endocrine:       Negative aside from HPI  Genitourinary:       Neg aside from HPI   Musculoskeletal:       Per HPI, otherwise negative  Skin: Negative.   Neurological: Negative for syncope.    Physical Exam Updated Vital Signs BP (!) 148/98 (BP Location: Left Arm)   Pulse 97   Temp 99 F (37.2 C) (Oral)   Resp 15   Ht 5\' 6"  (1.676 m)   Wt 54.4 kg   SpO2 100%   BMI 19.37 kg/m   Physical Exam Vitals and nursing note reviewed.  Constitutional:      General: She is not in acute distress.    Appearance: She is well-developed.  HENT:     Head: Normocephalic and atraumatic.  Eyes:     Conjunctiva/sclera: Conjunctivae normal.  Cardiovascular:     Rate and Rhythm: Normal rate and regular rhythm.  Pulmonary:     Effort: Pulmonary effort is normal. No respiratory distress.     Breath sounds: Normal breath sounds. No stridor.  Abdominal:     General: There is no distension.  Skin:    General: Skin is warm and dry.  Neurological:     Mental Status: She is alert and oriented to person, place, and time.     Cranial Nerves: No cranial nerve deficit.     ED Results / Procedures / Treatments   Labs (all labs ordered are listed, but only  abnormal results are displayed) Labs Reviewed  COMPREHENSIVE METABOLIC PANEL - Abnormal; Notable for the following components:      Result Value   Sodium 132 (*)    Glucose, Bld 107 (*)    Calcium 8.0 (*)    Total Protein 6.3 (*)    Albumin 2.5 (*)    AST 65 (*)    All other components within normal limits  CBC WITH DIFFERENTIAL/PLATELET - Abnormal; Notable for the following components:   WBC 3.9 (*)    RBC 3.48 (*)    Hemoglobin 11.2 (*)    HCT 33.6 (*)    Platelets 40 (*)    All other components within normal limits  LACTIC ACID, PLASMA  LACTIC ACID, PLASMA  URINALYSIS, ROUTINE W REFLEX MICROSCOPIC   Patient has no urinary complaints, urinalysis canceled. EKG None  Radiology DG Chest Port 1 View  Result Date: 09/06/2019 CLINICAL DATA:  54 year old female with fever. EXAM: PORTABLE CHEST 1 VIEW COMPARISON:  Chest radiograph dated 08/27/2019. FINDINGS: Right lung base density similar to prior radiograph. The left lung is clear. No large pleural effusion or pneumothorax. Stable cardiomediastinal silhouette. Atherosclerotic calcification of the aortic arch. No acute osseous pathology. IMPRESSION: No interval change. Electronically Signed   By: Anner Crete M.D.   On: 09/06/2019 22:25    Procedures Procedures (including critical care time)  Medications Ordered in ED Medications  lactated ringers bolus 1,000 mL (1,000 mLs Intravenous New Bag/Given 09/06/19 2216)  ketorolac (TORADOL) 30 MG/ML injection 15 mg (15 mg Intravenous Given 09/06/19 2218)    ED Course  I have reviewed the triage vital signs and the nursing notes.  Pertinent labs & imaging results that were available during my care of the patient were reviewed by me and considered in my medical decision making (see chart for details).  EMR reviewed after initial visit, notable for history of ongoing chemotherapy for lung/liver cancer, as well as recent admission for coronavirus.   11:13 PM Patient awake, alert,  speaking clearly, hemodynamically unremarkable.  She has no fever currently, essentially no complaints, having received fluid resuscitation.  Line reviewed labs, notable for absence of leukocytosis, lactic acidosis.  X-ray reviewed, largely unchanged, no evidence for pneumonia. With no hemodynamic instability, no ongoing complaints, reassuring labs, x-ray as above, and passage of 2 weeks since her most recent chemotherapy session, is no evidence for neutropenia, immunocompromise state, nor obvious infection. Some suspicion for sequelae of her recent coronavirus infection.  I we discussed return precautions, follow-up instructions, and patient was discharged in stable condition. Final Clinical Impression(s) / ED Diagnoses Final diagnoses:  Fever in adult     Carmin Muskrat, MD 09/06/19 2316

## 2019-09-06 NOTE — Discharge Instructions (Signed)
As discussed, your evaluation today has been largely reassuring.  But, it is important that you monitor your condition carefully, and do not hesitate to return to the ED if you develop new, or concerning changes in your condition. ? ?Otherwise, please follow-up with your physician for appropriate ongoing care. ? ?

## 2019-09-06 NOTE — ED Triage Notes (Signed)
Patient c/o fever, diarrhea, and body aches today. Recently d/c after dx with Covid on 5/18. Hx liver and lung cancer. Last chemo 5/13. Denies cough, chest pain, SOB, N/V.

## 2019-09-13 ENCOUNTER — Ambulatory Visit: Payer: Medicaid Other | Admitting: Nurse Practitioner

## 2019-09-13 ENCOUNTER — Ambulatory Visit: Payer: Medicaid Other

## 2019-09-13 ENCOUNTER — Other Ambulatory Visit: Payer: Medicaid Other

## 2019-09-16 ENCOUNTER — Inpatient Hospital Stay: Payer: Self-pay

## 2019-09-16 ENCOUNTER — Inpatient Hospital Stay: Payer: Self-pay | Admitting: Nurse Practitioner

## 2019-09-16 ENCOUNTER — Other Ambulatory Visit: Payer: Self-pay

## 2019-09-17 ENCOUNTER — Inpatient Hospital Stay: Payer: Self-pay | Attending: Oncology

## 2019-09-17 ENCOUNTER — Other Ambulatory Visit: Payer: Self-pay

## 2019-09-17 ENCOUNTER — Inpatient Hospital Stay (HOSPITAL_BASED_OUTPATIENT_CLINIC_OR_DEPARTMENT_OTHER): Payer: Self-pay | Admitting: Oncology

## 2019-09-17 VITALS — BP 139/93 | HR 78 | Temp 97.7°F | Resp 16 | Ht 66.0 in | Wt 112.8 lb

## 2019-09-17 DIAGNOSIS — K7460 Unspecified cirrhosis of liver: Secondary | ICD-10-CM | POA: Insufficient documentation

## 2019-09-17 DIAGNOSIS — Z8616 Personal history of COVID-19: Secondary | ICD-10-CM | POA: Insufficient documentation

## 2019-09-17 DIAGNOSIS — R188 Other ascites: Secondary | ICD-10-CM | POA: Insufficient documentation

## 2019-09-17 DIAGNOSIS — C220 Liver cell carcinoma: Secondary | ICD-10-CM

## 2019-09-17 DIAGNOSIS — B1920 Unspecified viral hepatitis C without hepatic coma: Secondary | ICD-10-CM | POA: Insufficient documentation

## 2019-09-17 DIAGNOSIS — Z5112 Encounter for antineoplastic immunotherapy: Secondary | ICD-10-CM | POA: Insufficient documentation

## 2019-09-17 DIAGNOSIS — F319 Bipolar disorder, unspecified: Secondary | ICD-10-CM | POA: Insufficient documentation

## 2019-09-17 LAB — CMP (CANCER CENTER ONLY)
ALT: 48 U/L — ABNORMAL HIGH (ref 0–44)
AST: 97 U/L — ABNORMAL HIGH (ref 15–41)
Albumin: 2.7 g/dL — ABNORMAL LOW (ref 3.5–5.0)
Alkaline Phosphatase: 146 U/L — ABNORMAL HIGH (ref 38–126)
Anion gap: 7 (ref 5–15)
BUN: 8 mg/dL (ref 6–20)
CO2: 30 mmol/L (ref 22–32)
Calcium: 8.9 mg/dL (ref 8.9–10.3)
Chloride: 101 mmol/L (ref 98–111)
Creatinine: 0.89 mg/dL (ref 0.44–1.00)
GFR, Est AFR Am: >60 mL/min (ref >60)
GFR, Estimated: >60 mL/min (ref >60)
Glucose, Bld: 107 mg/dL — ABNORMAL HIGH (ref 70–99)
Potassium: 4.2 mmol/L (ref 3.5–5.1)
Sodium: 138 mmol/L (ref 135–145)
Total Bilirubin: 1.7 mg/dL — ABNORMAL HIGH (ref 0.3–1.2)
Total Protein: 7.3 g/dL (ref 6.5–8.1)

## 2019-09-17 LAB — CBC WITH DIFFERENTIAL (CANCER CENTER ONLY)
Abs Immature Granulocytes: 0 10*3/uL (ref 0.00–0.07)
Basophils Absolute: 0 10*3/uL (ref 0.0–0.1)
Basophils Relative: 0 %
Eosinophils Absolute: 0.1 10*3/uL (ref 0.0–0.5)
Eosinophils Relative: 2 %
HCT: 38.3 % (ref 36.0–46.0)
Hemoglobin: 12.8 g/dL (ref 12.0–15.0)
Immature Granulocytes: 0 %
Lymphocytes Relative: 43 %
Lymphs Abs: 1.4 10*3/uL (ref 0.7–4.0)
MCH: 31.8 pg (ref 26.0–34.0)
MCHC: 33.4 g/dL (ref 30.0–36.0)
MCV: 95.3 fL (ref 80.0–100.0)
Monocytes Absolute: 0.3 10*3/uL (ref 0.1–1.0)
Monocytes Relative: 9 %
Neutro Abs: 1.5 10*3/uL — ABNORMAL LOW (ref 1.7–7.7)
Neutrophils Relative %: 46 %
Platelet Count: 72 10*3/uL — ABNORMAL LOW (ref 150–400)
RBC: 4.02 MIL/uL (ref 3.87–5.11)
RDW: 14.3 % (ref 11.5–15.5)
WBC Count: 3.2 10*3/uL — ABNORMAL LOW (ref 4.0–10.5)
nRBC: 0 % (ref 0.0–0.2)

## 2019-09-17 LAB — TOTAL PROTEIN, URINE DIPSTICK: Protein, ur: NEGATIVE mg/dL

## 2019-09-17 NOTE — Progress Notes (Signed)
[facility] OFFICE PROGRESS NOTE   Diagnosis: Hepatocellular carcinoma  INTERVAL HISTORY:   Valerie [name] was admitted on 08/27/2019 with COVID-19 infection.  She was treated with steroids and remdesivir.  She reports improvement in her symptoms.  She was discharged on 08/31/2019.  She continues to have intermittent pain in the right abdomen, but this has improved.  Her appetite has improved over the past week. A CT of the abdomen obtained on hospital admission revealed improvement in the hepatic disease and lung nodules.  She underwent a paracentesis for 850 cc of fluid on 08/27/2019.  The cytology revealed reactive mesothelial cells.  A culture returned negative.  She denies bleeding and symptoms of thrombosis.    Objective:  Vital signs in last 24 hours:  Blood pressure (!) 139/93, pulse 78, temperature 97.7 F (36.5 C), temperature source Oral, resp. rate 16, height 5\' 6"  (1.676 m), weight 112 lb 12.8 oz (51.2 kg), SpO2 98 %.     Resp: Lungs clear bilaterally, no respiratory distress Cardio: Regular rate and rhythm GI: No hepatomegaly, soft and nontender Vascular: No leg edema      Lab Results:  Lab Results  Component Value Date   WBC 3.2 (L) 09/17/2019   HGB 12.8 09/17/2019   HCT 38.3 09/17/2019   MCV 95.3 09/17/2019   PLT 72 (L) 09/17/2019   NEUTROABS 1.5 (L) 09/17/2019    CMP  Lab Results  Component Value Date   NA 138 09/17/2019   K 4.2 09/17/2019   CL 101 09/17/2019   CO2 30 09/17/2019   GLUCOSE 107 (H) 09/17/2019   BUN 8 09/17/2019   CREATININE 0.89 09/17/2019   CALCIUM 8.9 09/17/2019   PROT 7.3 09/17/2019   ALBUMIN 2.7 (L) 09/17/2019   AST 97 (H) 09/17/2019   ALT 48 (H) 09/17/2019   ALKPHOS 146 (H) 09/17/2019   BILITOT 1.7 (H) 09/17/2019   GFRNONAA >60 09/17/2019   GFRAA >60 09/17/2019     Imaging: CT abdomen/pelvis 08/27/2019-images reviewed  Medications: I have reviewed the patient's current  medications.   Assessment/Plan: 1. Liver and lung lesions concerning for metastatic cancer, ?[place] primary -CT 06/28/2019-probable cirrhosis, large heterogenous mass in the right liver extending to the right lung base with possible violation of the capsule and invasion of the right hemidiaphragm, at least 1 additional mass in the inferior right liver -06/29/2019 MRI of the abdomen-probable diffusely infiltrative hepatocellular carcinoma throughout the right lobe of the liver which appears to extend out of the hepatic capsule and invade the right hemidiaphragm, tumor thrombus involving the right portal and hepatic veins, esophageal and proximal gastric varices -06/30/2019 CT of the chest without contrast-multiple pulmonary nodules concerning for widespread metastatic disease to the lungs, large right lobe liver mass with transdiaphragmatic invasion into the lower right hemithorax involving portions of the right lower lobe -07/01/2019 -AFP 400,867 -Cycle 1 atezolizumab/bevacizumab 07/12/2019 -Cycle 2 atezolizumab/bevacizumab 08/02/2019 -Cycle 3 atezolizumab/bevacizumab 08/22/2019 -CT 08/27/2019-new ascites, decreased right liver mass with diminished diaphragmatic involvement, improved pulmonary nodules -Cycle 4 atezolizumab/bevacizumab 09/19/2019 2. Hepatitis C -Reports prior treatment with 16 weeks of interferon ribavirin -06/29/2019 HCV RNA quantitative 634,000 3. Cirrhosis 4. Pseudoseizures 5. Bipolar disorder 6. Anemia and thrombocytopenia 7. Elevated LFTs 8.  Admission with COVID-19 pneumonia 08/27/2019, treated with steroids and remdesivir   Disposition: Ms. Maffeo has completed 3 treatments with atezolizumab/bevacizumab.  The abdominal pain associated with her hepatocellular carcinoma has improved.  A CT on 08/27/2019 revealed evidence of improved disease.  The plan is to  continue atezolizumab/bevacizumab.  She will return for cycle 4 tomorrow.  She has recovered from a recent COVID-19  infection.  She will return for an office visit in the next cycle of atezolizumab/bevacizumab in 3 weeks.  We will follow up on the AFP level from today.  Betsy Coder, MD  09/17/2019  2:13 PM

## 2019-09-17 NOTE — Progress Notes (Signed)
Per Dr. Benay Spice: OK to treat on 09/18/19 with ANC 1.5 and platelet count 75,000

## 2019-09-18 ENCOUNTER — Other Ambulatory Visit: Payer: Self-pay

## 2019-09-18 ENCOUNTER — Inpatient Hospital Stay: Payer: Self-pay

## 2019-09-18 VITALS — BP 141/90 | HR 88 | Temp 98.4°F | Resp 18 | Wt 112.5 lb

## 2019-09-18 DIAGNOSIS — C220 Liver cell carcinoma: Secondary | ICD-10-CM

## 2019-09-18 LAB — AFP TUMOR MARKER: AFP, Serum, Tumor Marker: 24955 ng/mL — ABNORMAL HIGH (ref 0.0–8.3)

## 2019-09-18 MED ORDER — SODIUM CHLORIDE 0.9 % IV SOLN
15.0000 mg/kg | Freq: Once | INTRAVENOUS | Status: AC
  Administered 2019-09-18: 800 mg via INTRAVENOUS
  Filled 2019-09-18: qty 32

## 2019-09-18 MED ORDER — SODIUM CHLORIDE 0.9 % IV SOLN
Freq: Once | INTRAVENOUS | Status: AC
  Filled 2019-09-18: qty 250

## 2019-09-18 MED ORDER — SODIUM CHLORIDE 0.9 % IV SOLN
1200.0000 mg | Freq: Once | INTRAVENOUS | Status: AC
  Administered 2019-09-18: 1200 mg via INTRAVENOUS
  Filled 2019-09-18: qty 20

## 2019-09-18 NOTE — Patient Instructions (Signed)
[facility] Discharge Instructions for Patients Receiving Chemotherapy  Today you received the following chemotherapy agents Bevacizumab ([facility]) & Atezolizumab [facility]).  To help prevent nausea and vomiting after your treatment, we encourage you to take your nausea medication as prescribed.   If you develop nausea and vomiting that is not controlled by your nausea medication, call the clinic.   BELOW ARE SYMPTOMS THAT SHOULD BE REPORTED IMMEDIATELY:  *FEVER GREATER THAN 100.5 F  *CHILLS WITH OR WITHOUT FEVER  NAUSEA AND VOMITING THAT IS NOT CONTROLLED WITH YOUR NAUSEA MEDICATION  *UNUSUAL SHORTNESS OF BREATH  *UNUSUAL BRUISING OR BLEEDING  TENDERNESS IN MOUTH AND THROAT WITH OR WITHOUT PRESENCE OF ULCERS  *URINARY PROBLEMS  *BOWEL PROBLEMS  UNUSUAL RASH Items with * indicate a potential emergency and should be followed up as soon as possible.  Feel free to call the clinic should you have any questions or concerns. The clinic phone number is [phone].  Please show the [facility] at check-in to the Emergency Department and triage nurse.

## 2019-09-20 ENCOUNTER — Ambulatory Visit: Payer: Medicaid Other | Admitting: Family Medicine

## 2019-09-23 ENCOUNTER — Encounter: Payer: Medicaid Other | Admitting: Licensed Clinical Social Worker

## 2019-09-23 ENCOUNTER — Telehealth: Payer: Medicaid Other | Admitting: Family

## 2019-09-24 ENCOUNTER — Ambulatory Visit: Payer: Self-pay | Attending: Family Medicine | Admitting: Family Medicine

## 2019-09-24 DIAGNOSIS — C220 Liver cell carcinoma: Secondary | ICD-10-CM

## 2019-09-24 DIAGNOSIS — F411 Generalized anxiety disorder: Secondary | ICD-10-CM

## 2019-09-24 DIAGNOSIS — I10 Essential (primary) hypertension: Secondary | ICD-10-CM

## 2019-09-24 DIAGNOSIS — Z8616 Personal history of COVID-19: Secondary | ICD-10-CM

## 2019-09-24 DIAGNOSIS — R1013 Epigastric pain: Secondary | ICD-10-CM

## 2019-09-24 MED ORDER — HYDROCHLOROTHIAZIDE 25 MG PO TABS: 25.0000 mg | ORAL_TABLET | Freq: Every day | ORAL | 6 refills | Status: DC

## 2019-09-24 MED ORDER — FLUOXETINE HCL 40 MG PO CAPS: 40.0000 mg | ORAL_CAPSULE | Freq: Every day | ORAL | 6 refills | Status: DC

## 2019-09-24 MED ORDER — PANTOPRAZOLE SODIUM 40 MG PO TBEC: 40.0000 mg | DELAYED_RELEASE_TABLET | Freq: Every day | ORAL | 3 refills | Status: DC

## 2019-09-24 NOTE — Progress Notes (Signed)
Patient states that she is feeling better and has no concerns.

## 2019-09-24 NOTE — Progress Notes (Signed)
Virtual Visit via Telephone Note  I connected with Valerie [name], on 09/24/2019 at 4:25 PM by telephone due to the COVID-19 pandemic and verified that I am speaking with the correct person using two identifiers.   Consent: I discussed the limitations, risks, security and privacy concerns of performing an evaluation and management service by telephone and the availability of in person appointments. I also discussed with the patient that there may be a patient responsible charge related to this service. The patient expressed understanding and agreed to proceed.   Location of Patient: Home  Location of Provider: Clinic   Persons participating in Telemedicine visit: Makinsley Schiavi Farrington-CMA Dr. Margarita Rana     History of Present Illness: Valerie [name] is a 54 year old female who presents to establish care.  Medical history is significant for hepatitis C (completed treatment with Ribavirin), Cirrhosis, bipolar disorder, pseudoseizures, hepatocellular carcinoma with pulmonary metastases diagnosed in 06/2019. In 08/2019 she was hospitalized with Covid-19 and feels fine now; she is back to baseline she states. Last seen by her oncologist last week and CT revealed evidence of improved disease. She is currently on Atezolizumab/bevacizumab.  She is using the bathroom a lot and denies diarrhea; states she thinks it is as a result of eating too much. Denies presence of abdominal pain. She has no dyspnea or chest pain. She has been fatigued and in the bed for the last couple of days. Her last infusion was on 09/18/19   Currently on Prozac and sometimes gets depressed and cries a lot due to her medical condition as she thinks she is about to die; she gets overwhelmed. She has not been to see a therapist. Denies suicidal ideations or intent.  Past Medical History:  Diagnosis Date  . Bipolar disorder ([place])   . Hepatitis C   . Liver cirrhosis ([place])   . Pseudoseizures    Allergies  Allergen  Reactions  . Norco [Hydrocodone-Acetaminophen] Anaphylaxis and Shortness Of Breath  . Tylenol [Acetaminophen] Other (See Comments)    Cannot take due to liver    Current Outpatient Medications on File Prior to Visit  Medication Sig Dispense Refill  . albuterol (VENTOLIN HFA) 108 (90 Base) MCG/ACT inhaler Inhale 1 puff into the lungs every 4 (four) hours as needed for wheezing or shortness of breath. 18 g 0  . FLUoxetine (PROZAC) 20 MG capsule Take 1 capsule (20 mg total) by mouth daily. 30 capsule 0  . hydrochlorothiazide (HYDRODIURIL) 25 MG tablet Take 1 tablet (25 mg total) by mouth daily. 30 tablet 0  . HYDROmorphone (DILAUDID) 2 MG tablet Take 1 tablet (2 mg total) by mouth every 4 (four) hours as needed for severe pain. 60 tablet 0  . ibuprofen (ADVIL) 600 MG tablet Take 1 tablet (600 mg total) by mouth every 6 (six) hours as needed. 30 tablet 0  . loperamide (IMODIUM A-D) 2 MG tablet Take 2-4 mg by mouth 4 (four) times daily as needed.    . Nutritional Supplements (ENSURE COMPLETE PO) Take 1 Can by mouth 3 (three) times daily.    . pantoprazole (PROTONIX) 40 MG tablet Take 1 tablet (40 mg total) by mouth daily. 30 tablet 0  . sennosides-docusate sodium (SENOKOT-S) 8.6-50 MG tablet Take 1-2 tablets by mouth 2 (two) times daily as needed.    Marland Kitchen guaiFENesin-dextromethorphan (ROBITUSSIN DM) 100-10 MG/5ML syrup Take 5 mLs by mouth every 6 (six) hours as needed for cough. (Patient not taking: Reported on 09/17/2019) 118 mL 0  . methocarbamol (  ROBAXIN) 500 MG tablet Take 1 tablet (500 mg total) by mouth 2 (two) times daily. (Patient not taking: Reported on 09/17/2019) 20 tablet 0  . prochlorperazine (COMPAZINE) 10 MG tablet Take 1 tablet (10 mg total) by mouth every 6 (six) hours as needed. (Patient not taking: Reported on 09/17/2019) 30 tablet 2   No current facility-administered medications on file prior to visit.    Observations/Objective: Awake, alert, oriented x3 Not in acute  distress  Assessment and Plan: 1. Generalized anxiety disorder Uncontrolled, secondary to underlying medical condition Increase Prozac from 20 to 40 mg She is scheduled to see LCSW for therapy - FLUoxetine (PROZAC) 40 MG capsule; Take 1 capsule (40 mg total) by mouth daily.  Dispense: 30 capsule; Refill: 6  2. Hepatocellular carcinoma ([place]) currently on Atezolizumab/bevacizumab. Evidence of improvement on recent imaging Followed by oncology  3. Essential hypertension Controlled Counseled on blood pressure goal of less than 130/80, low-sodium, DASH diet, medication compliance, 150 minutes of moderate intensity exercise per week. Discussed medication compliance, adverse effects.  - hydrochlorothiazide (HYDRODIURIL) 25 MG tablet; Take 1 tablet (25 mg total) by mouth daily.  Dispense: 30 tablet; Refill: 6  4. History of COVID-19 Asymptomatic  5. Dyspepsia She requests refill of Protonix which I have done - pantoprazole (PROTONIX) 40 MG tablet; Take 1 tablet (40 mg total) by mouth daily.  Dispense: 30 tablet; Refill: 3   Follow Up Instructions: 3 months for chronic disease management   I discussed the assessment and treatment plan with the patient. The patient was provided an opportunity to ask questions and all were answered. The patient agreed with the plan and demonstrated an understanding of the instructions.   The patient was advised to call back or seek an in-person evaluation if the symptoms worsen or if the condition fails to improve as anticipated.     I provided 17 minutes total of non-face-to-face time during this encounter including median intraservice time, reviewing previous notes, investigations, ordering medications, medical decision making, coordinating care and patient verbalized understanding at the end of the visit.     Charlott Rakes, MD, FAAFP. [facility], [place]   09/24/2019, 4:25 PM

## 2019-09-25 ENCOUNTER — Encounter: Payer: Self-pay | Admitting: Family Medicine

## 2019-09-25 MED FILL — HYDROCHLOROTHIAZIDE 25 MG T: 25 | 30 days supply | Qty: 30 | Fill #0

## 2019-09-25 MED FILL — PANTOPRAZOLE SOD DR 40 MG T: 40 | 30 days supply | Qty: 30 | Fill #0

## 2019-09-25 MED FILL — FLUoxetine HCL 40 MG CAPS: 40 | 30 days supply | Qty: 30 | Fill #0

## 2019-10-02 ENCOUNTER — Other Ambulatory Visit: Payer: Self-pay | Admitting: Family Medicine

## 2019-10-02 DIAGNOSIS — F411 Generalized anxiety disorder: Secondary | ICD-10-CM

## 2019-10-03 ENCOUNTER — Other Ambulatory Visit: Payer: Self-pay | Admitting: Nurse Practitioner

## 2019-10-03 DIAGNOSIS — C220 Liver cell carcinoma: Secondary | ICD-10-CM

## 2019-10-03 MED ORDER — HYDROMORPHONE HCL 2 MG PO TABS: 2.0000 mg | ORAL_TABLET | ORAL | 0 refills | Status: DC | PRN

## 2019-10-06 ENCOUNTER — Other Ambulatory Visit: Payer: Self-pay | Admitting: Oncology

## 2019-10-10 ENCOUNTER — Inpatient Hospital Stay: Payer: Medicaid Other

## 2019-10-10 ENCOUNTER — Other Ambulatory Visit: Payer: Self-pay

## 2019-10-10 ENCOUNTER — Inpatient Hospital Stay: Payer: Medicaid Other | Admitting: Nutrition

## 2019-10-10 ENCOUNTER — Inpatient Hospital Stay: Payer: Medicaid Other | Admitting: Nurse Practitioner

## 2019-10-17 ENCOUNTER — Encounter: Payer: Self-pay | Admitting: Nutrition

## 2019-10-17 NOTE — Progress Notes (Signed)
Late entry Patient was scheduled for nutrition appointment secondary to weight loss and poor appetite.  Patient canceled appointment and did not reschedule.

## 2019-10-17 NOTE — Progress Notes (Signed)
Pharmacist Chemotherapy Monitoring - Follow Up Assessment    I verify that I have reviewed each item in the below checklist:  . Regimen for the patient is scheduled for the appropriate day and plan matches scheduled date. Marland Kitchen Appropriate non-routine labs are ordered dependent on drug ordered. . If applicable, additional medications reviewed and ordered per protocol based on lifetime cumulative doses and/or treatment regimen.   Plan for follow-up and/or issues identified: No . I-vent associated with next due treatment: No . MD and/or nursing notified: No   Kennith Center, Pharm.D., CPP 10/17/2019@9 :25 AM

## 2019-10-19 ENCOUNTER — Other Ambulatory Visit: Payer: Self-pay | Admitting: Oncology

## 2019-10-23 ENCOUNTER — Other Ambulatory Visit: Payer: Self-pay

## 2019-10-23 ENCOUNTER — Inpatient Hospital Stay: Payer: Medicaid Other

## 2019-10-23 ENCOUNTER — Inpatient Hospital Stay (HOSPITAL_BASED_OUTPATIENT_CLINIC_OR_DEPARTMENT_OTHER): Payer: Medicaid Other | Admitting: Nurse Practitioner

## 2019-10-23 ENCOUNTER — Encounter: Payer: Self-pay | Admitting: Nurse Practitioner

## 2019-10-23 ENCOUNTER — Inpatient Hospital Stay: Payer: Medicaid Other | Attending: Oncology

## 2019-10-23 VITALS — BP 131/81 | HR 85 | Temp 97.8°F | Resp 17 | Ht 66.0 in | Wt 115.0 lb

## 2019-10-23 DIAGNOSIS — Z8616 Personal history of COVID-19: Secondary | ICD-10-CM | POA: Diagnosis not present

## 2019-10-23 DIAGNOSIS — B1920 Unspecified viral hepatitis C without hepatic coma: Secondary | ICD-10-CM | POA: Diagnosis not present

## 2019-10-23 DIAGNOSIS — C220 Liver cell carcinoma: Secondary | ICD-10-CM

## 2019-10-23 DIAGNOSIS — K7460 Unspecified cirrhosis of liver: Secondary | ICD-10-CM | POA: Insufficient documentation

## 2019-10-23 DIAGNOSIS — D649 Anemia, unspecified: Secondary | ICD-10-CM | POA: Insufficient documentation

## 2019-10-23 DIAGNOSIS — F319 Bipolar disorder, unspecified: Secondary | ICD-10-CM | POA: Diagnosis not present

## 2019-10-23 DIAGNOSIS — Z5112 Encounter for antineoplastic immunotherapy: Secondary | ICD-10-CM | POA: Diagnosis present

## 2019-10-23 DIAGNOSIS — Z5111 Encounter for antineoplastic chemotherapy: Secondary | ICD-10-CM | POA: Insufficient documentation

## 2019-10-23 LAB — CMP (CANCER CENTER ONLY)
ALT: 45 U/L — ABNORMAL HIGH (ref 0–44)
AST: 123 U/L — ABNORMAL HIGH (ref 15–41)
Albumin: 3 g/dL — ABNORMAL LOW (ref 3.5–5.0)
Alkaline Phosphatase: 122 U/L (ref 38–126)
Anion gap: 10 (ref 5–15)
BUN: 13 mg/dL (ref 6–20)
CO2: 26 mmol/L (ref 22–32)
Calcium: 8.6 mg/dL — ABNORMAL LOW (ref 8.9–10.3)
Chloride: 101 mmol/L (ref 98–111)
Creatinine: 1.01 mg/dL — ABNORMAL HIGH (ref 0.44–1.00)
GFR, Est AFR Am: >60 mL/min (ref >60)
GFR, Estimated: >60 mL/min (ref >60)
Glucose, Bld: 92 mg/dL (ref 70–99)
Potassium: 3.1 mmol/L — ABNORMAL LOW (ref 3.5–5.1)
Sodium: 137 mmol/L (ref 135–145)
Total Bilirubin: 2.5 mg/dL — ABNORMAL HIGH (ref 0.3–1.2)
Total Protein: 7.3 g/dL (ref 6.5–8.1)

## 2019-10-23 LAB — CBC WITH DIFFERENTIAL (CANCER CENTER ONLY)
Abs Immature Granulocytes: 0.01 10*3/uL (ref 0.00–0.07)
Basophils Absolute: 0 10*3/uL (ref 0.0–0.1)
Basophils Relative: 1 %
Eosinophils Absolute: 0.1 10*3/uL (ref 0.0–0.5)
Eosinophils Relative: 3 %
HCT: 37.6 % (ref 36.0–46.0)
Hemoglobin: 12.9 g/dL (ref 12.0–15.0)
Immature Granulocytes: 0 %
Lymphocytes Relative: 36 %
Lymphs Abs: 1.4 10*3/uL (ref 0.7–4.0)
MCH: 31.3 pg (ref 26.0–34.0)
MCHC: 34.3 g/dL (ref 30.0–36.0)
MCV: 91.3 fL (ref 80.0–100.0)
Monocytes Absolute: 0.4 10*3/uL (ref 0.1–1.0)
Monocytes Relative: 10 %
Neutro Abs: 2 10*3/uL (ref 1.7–7.7)
Neutrophils Relative %: 50 %
Platelet Count: 56 10*3/uL — ABNORMAL LOW (ref 150–400)
RBC: 4.12 MIL/uL (ref 3.87–5.11)
RDW: 14.9 % (ref 11.5–15.5)
WBC Count: 3.9 10*3/uL — ABNORMAL LOW (ref 4.0–10.5)
nRBC: 0 % (ref 0.0–0.2)

## 2019-10-23 LAB — TOTAL PROTEIN, URINE DIPSTICK: Protein, ur: NEGATIVE mg/dL

## 2019-10-23 MED ORDER — POTASSIUM CHLORIDE CRYS ER 20 MEQ PO TBCR: 20.0000 meq | EXTENDED_RELEASE_TABLET | Freq: Every day | ORAL | 1 refills | Status: DC

## 2019-10-23 MED ORDER — SODIUM CHLORIDE 0.9 % IV SOLN
1200.0000 mg | Freq: Once | INTRAVENOUS | Status: AC
  Administered 2019-10-23: 1200 mg via INTRAVENOUS
  Filled 2019-10-23: qty 20

## 2019-10-23 MED ORDER — SODIUM CHLORIDE 0.9 % IV SOLN
15.0000 mg/kg | Freq: Once | INTRAVENOUS | Status: AC
  Administered 2019-10-23: 800 mg via INTRAVENOUS
  Filled 2019-10-23: qty 32

## 2019-10-23 MED ORDER — SODIUM CHLORIDE 0.9 % IV SOLN
Freq: Once | INTRAVENOUS | Status: AC
  Filled 2019-10-23: qty 250

## 2019-10-23 NOTE — Patient Instructions (Signed)
[facility] Discharge Instructions for Patients Receiving Chemotherapy  Today you received the following chemotherapy agents Bevacizumab (MVASI) & Atezolizumab [facility]).  To help prevent nausea and vomiting after your treatment, we encourage you to take your nausea medication as prescribed.   If you develop nausea and vomiting that is not controlled by your nausea medication, call the clinic.   BELOW ARE SYMPTOMS THAT SHOULD BE REPORTED IMMEDIATELY:  *FEVER GREATER THAN 100.5 F  *CHILLS WITH OR WITHOUT FEVER  NAUSEA AND VOMITING THAT IS NOT CONTROLLED WITH YOUR NAUSEA MEDICATION  *UNUSUAL SHORTNESS OF BREATH  *UNUSUAL BRUISING OR BLEEDING  TENDERNESS IN MOUTH AND THROAT WITH OR WITHOUT PRESENCE OF ULCERS  *URINARY PROBLEMS  *BOWEL PROBLEMS  UNUSUAL RASH Items with * indicate a potential emergency and should be followed up as soon as possible.  Feel free to call the clinic should you have any questions or concerns. The clinic phone number is [phone].  Please show the [place] at check-in to the Emergency Department and triage nurse.

## 2019-10-23 NOTE — Progress Notes (Signed)
  [facility] OFFICE PROGRESS NOTE   Diagnosis: Hepatocellular carcinoma  INTERVAL HISTORY:   Ms. Espey returns for follow-up.  She completed cycle 4 atezolizumab/bevacizumab 09/19/2019.  She reports recent a.m. nausea.  Prozac dose was recently increased.  She wonders if the nausea is related to the increased dose.  She is fatigued.  No diarrhea.  No skin rash.  She has had two nosebleeds since her last treatment.  No mouth sores.  She has mild dyspnea on exertion.  No cough or fever.  Pain continues to be improved.  Objective:  Vital signs in last 24 hours:  Blood pressure 131/81, pulse 85, temperature 97.8 F (36.6 C), temperature source Temporal, resp. rate 17, height 5\' 6"  (1.676 m), weight 115 lb (52.2 kg), SpO2 99 %.    Resp: Lungs clear bilaterally. Cardio: Regular rate and rhythm. GI: Abdomen is softly distended.  No hepatomegaly. Vascular: No leg edema. Skin: No rash.   Lab Results:  Lab Results  Component Value Date   WBC 3.9 (L) 10/23/2019   HGB 12.9 10/23/2019   HCT 37.6 10/23/2019   MCV 91.3 10/23/2019   PLT 56 (L) 10/23/2019   NEUTROABS 2.0 10/23/2019    Imaging:  No results found.  Medications: I have reviewed the patient's current medications.  Assessment/Plan: 1. Liver and lung lesions concerning for metastatic cancer, ?[place] primary -CT 06/28/2019-probable cirrhosis, large heterogenous mass in the right liver extending to the right lung base with possible violation of the capsule and invasion of the right hemidiaphragm, at least 1 additional mass in the inferior right liver -06/29/2019 MRI of the abdomen-probable diffusely infiltrative hepatocellular carcinoma throughout the right lobe of the liver which appears to extend out of the hepatic capsule and invade the right hemidiaphragm, tumor thrombus involving the right portal and hepatic veins, esophageal and proximal gastric varices -06/30/2019 CT of the chest without contrast-multiple  pulmonary nodules concerning for widespread metastatic disease to the lungs, large right lobe liver mass with transdiaphragmatic invasion into the lower right hemithorax involving portions of the right lower lobe -07/01/2019 -[id number] -Cycle 1atezolizumab/bevacizumab 07/12/2019 -Cycle 2 atezolizumab/bevacizumab 08/02/2019 -Cycle 3 atezolizumab/bevacizumab 08/22/2019 -CT 08/27/2019-new ascites, decreased right liver mass with diminished diaphragmatic involvement, improved pulmonary nodules -Cycle 4 atezolizumab/bevacizumab 09/19/2019 -Cycle 5 atezolizumab/bevacizumab 10/23/2019 2. Hepatitis C -Reports prior treatment with 16 weeks of interferon ribavirin -06/29/2019 HCV RNA quantitative 634,000 3. Cirrhosis 4. Pseudoseizures 5. Bipolar disorder 6. Anemia and thrombocytopenia 7. Elevated LFTs 8.  Admission with COVID-19 pneumonia 08/27/2019, treated with steroids and remdesivir  Disposition: Ms. Schmale appears stable.  She has completed 4 cycles of atezolizumab/bevacizumab.  There is no clinical evidence of disease progression.  Plan to proceed with cycle 5 today as scheduled.  We reviewed the CBC and chemistry panel from today.  Labs are adequate to proceed with treatment as above.  She has stable moderate thrombocytopenia.  Stable liver function abnormalities.  She has hypokalemia and will begin K-Dur 20 milliequivalents daily.  She will contact her PCP regarding a.m. nausea, possible relationship to higher dose of Prozac.  She will return for lab, follow-up, cycle 6 atezolizumab/bevacizumab in 3 weeks.  She will contact the office in the interim with any problems.    Ned Card ANP/GNP-BC   10/23/2019  2:32 PM

## 2019-10-25 ENCOUNTER — Telehealth: Payer: Self-pay | Admitting: Oncology

## 2019-10-25 NOTE — Telephone Encounter (Signed)
Scheduled per 7/14 los. Called and spoke with pt, confirmed 8/3 appt

## 2019-10-31 ENCOUNTER — Other Ambulatory Visit: Payer: Self-pay | Admitting: Oncology

## 2019-10-31 ENCOUNTER — Telehealth: Payer: Self-pay | Admitting: *Deleted

## 2019-10-31 DIAGNOSIS — C220 Liver cell carcinoma: Secondary | ICD-10-CM

## 2019-10-31 MED ORDER — HYDROMORPHONE HCL 2 MG PO TABS: 2.0000 mg | ORAL_TABLET | ORAL | 0 refills | Status: DC | PRN

## 2019-10-31 MED ORDER — ONDANSETRON HCL 8 MG PO TABS
8.0000 mg | ORAL_TABLET | Freq: Three times a day (TID) | ORAL | 1 refills | Status: DC | PRN
Start: 2019-10-31 — End: 2019-12-10

## 2019-10-31 MED FILL — ONDANSETRON HCL 8 MG TABLET: 8 | 10 days supply | Qty: 30 | Fill #0

## 2019-10-31 NOTE — Telephone Encounter (Addendum)
Calling to report she is having upper/mid abdominal pain rated "10" and will decrease to "7" after hydromorphone medication. Reports her pain has increased over past few days as well as nausea. Thinks the compazine makes her stomach hurt and would like to try something else. She is not eating much, but feels her fluid intake is adequate. Last BM was yesterday, which was normal and she is passing gas. No fever. She did not think she needed to be seen at this time if MD will refill her pain med (has #6 on hand) and order a new anti-emetic.  Wishes to use [employer].

## 2019-10-31 NOTE — Telephone Encounter (Signed)
Notified patient that both scripts were sent to [facility]. Call if this is not helping.

## 2019-11-01 ENCOUNTER — Telehealth: Payer: Self-pay | Admitting: *Deleted

## 2019-11-01 ENCOUNTER — Other Ambulatory Visit: Payer: Self-pay | Admitting: Oncology

## 2019-11-01 DIAGNOSIS — C220 Liver cell carcinoma: Secondary | ICD-10-CM

## 2019-11-01 MED ORDER — HYDROMORPHONE HCL 2 MG PO TABS: 2.0000 mg | ORAL_TABLET | ORAL | 0 refills | Status: DC | PRN

## 2019-11-01 NOTE — Telephone Encounter (Signed)
Called to report she just found out that [facility] does not dispense narcotics. Requesting script be sent to [facility] on [address]. MD notified.

## 2019-11-04 MED FILL — PANTOPRAZOLE SOD DR 40 MG T: 40 | 30 days supply | Qty: 30 | Fill #1

## 2019-11-04 MED FILL — FLUoxetine HCL 40 MG CAPS: 40 | 30 days supply | Qty: 30 | Fill #1

## 2019-11-04 MED FILL — HYDROCHLOROTHIAZIDE 25 MG T: 25 | 30 days supply | Qty: 30 | Fill #1

## 2019-11-09 ENCOUNTER — Other Ambulatory Visit: Payer: Self-pay | Admitting: Oncology

## 2019-11-12 ENCOUNTER — Inpatient Hospital Stay: Payer: Medicaid Other | Attending: Oncology

## 2019-11-12 ENCOUNTER — Telehealth: Payer: Self-pay

## 2019-11-12 ENCOUNTER — Inpatient Hospital Stay (HOSPITAL_BASED_OUTPATIENT_CLINIC_OR_DEPARTMENT_OTHER): Payer: Medicaid Other | Admitting: Oncology

## 2019-11-12 ENCOUNTER — Encounter: Payer: Self-pay | Admitting: Oncology

## 2019-11-12 ENCOUNTER — Inpatient Hospital Stay: Payer: Medicaid Other

## 2019-11-12 ENCOUNTER — Other Ambulatory Visit: Payer: Self-pay

## 2019-11-12 VITALS — BP 132/89 | HR 77 | Temp 98.7°F | Resp 16 | Wt 113.4 lb

## 2019-11-12 DIAGNOSIS — Z5111 Encounter for antineoplastic chemotherapy: Secondary | ICD-10-CM | POA: Diagnosis not present

## 2019-11-12 DIAGNOSIS — C220 Liver cell carcinoma: Secondary | ICD-10-CM | POA: Diagnosis not present

## 2019-11-12 DIAGNOSIS — Z8616 Personal history of COVID-19: Secondary | ICD-10-CM | POA: Insufficient documentation

## 2019-11-12 DIAGNOSIS — D696 Thrombocytopenia, unspecified: Secondary | ICD-10-CM | POA: Insufficient documentation

## 2019-11-12 DIAGNOSIS — Z5112 Encounter for antineoplastic immunotherapy: Secondary | ICD-10-CM | POA: Insufficient documentation

## 2019-11-12 DIAGNOSIS — B1920 Unspecified viral hepatitis C without hepatic coma: Secondary | ICD-10-CM | POA: Diagnosis not present

## 2019-11-12 DIAGNOSIS — R140 Abdominal distension (gaseous): Secondary | ICD-10-CM | POA: Insufficient documentation

## 2019-11-12 DIAGNOSIS — R162 Hepatomegaly with splenomegaly, not elsewhere classified: Secondary | ICD-10-CM | POA: Insufficient documentation

## 2019-11-12 DIAGNOSIS — R944 Abnormal results of kidney function studies: Secondary | ICD-10-CM | POA: Diagnosis not present

## 2019-11-12 DIAGNOSIS — G40909 Epilepsy, unspecified, not intractable, without status epilepticus: Secondary | ICD-10-CM | POA: Insufficient documentation

## 2019-11-12 DIAGNOSIS — F319 Bipolar disorder, unspecified: Secondary | ICD-10-CM | POA: Insufficient documentation

## 2019-11-12 DIAGNOSIS — K7460 Unspecified cirrhosis of liver: Secondary | ICD-10-CM | POA: Diagnosis not present

## 2019-11-12 DIAGNOSIS — I700 Atherosclerosis of aorta: Secondary | ICD-10-CM | POA: Diagnosis not present

## 2019-11-12 DIAGNOSIS — D649 Anemia, unspecified: Secondary | ICD-10-CM | POA: Insufficient documentation

## 2019-11-12 DIAGNOSIS — R188 Other ascites: Secondary | ICD-10-CM | POA: Diagnosis not present

## 2019-11-12 DIAGNOSIS — I81 Portal vein thrombosis: Secondary | ICD-10-CM | POA: Diagnosis not present

## 2019-11-12 LAB — CBC WITH DIFFERENTIAL (CANCER CENTER ONLY)
Abs Immature Granulocytes: 0.01 10*3/uL (ref 0.00–0.07)
Basophils Absolute: 0 10*3/uL (ref 0.0–0.1)
Basophils Relative: 1 %
Eosinophils Absolute: 0.1 10*3/uL (ref 0.0–0.5)
Eosinophils Relative: 4 %
HCT: 39.5 % (ref 36.0–46.0)
Hemoglobin: 13.3 g/dL (ref 12.0–15.0)
Immature Granulocytes: 0 %
Lymphocytes Relative: 45 %
Lymphs Abs: 1.4 10*3/uL (ref 0.7–4.0)
MCH: 31.1 pg (ref 26.0–34.0)
MCHC: 33.7 g/dL (ref 30.0–36.0)
MCV: 92.5 fL (ref 80.0–100.0)
Monocytes Absolute: 0.3 10*3/uL (ref 0.1–1.0)
Monocytes Relative: 8 %
Neutro Abs: 1.3 10*3/uL — ABNORMAL LOW (ref 1.7–7.7)
Neutrophils Relative %: 42 %
Platelet Count: 50 10*3/uL — ABNORMAL LOW (ref 150–400)
RBC: 4.27 MIL/uL (ref 3.87–5.11)
RDW: 14.6 % (ref 11.5–15.5)
WBC Count: 3 10*3/uL — ABNORMAL LOW (ref 4.0–10.5)
nRBC: 0 % (ref 0.0–0.2)

## 2019-11-12 LAB — CMP (CANCER CENTER ONLY)
ALT: 37 U/L (ref 0–44)
AST: 115 U/L — ABNORMAL HIGH (ref 15–41)
Albumin: 3.2 g/dL — ABNORMAL LOW (ref 3.5–5.0)
Alkaline Phosphatase: 116 U/L (ref 38–126)
Anion gap: 9 (ref 5–15)
BUN: 12 mg/dL (ref 6–20)
CO2: 31 mmol/L (ref 22–32)
Calcium: 9.6 mg/dL (ref 8.9–10.3)
Chloride: 100 mmol/L (ref 98–111)
Creatinine: 1.19 mg/dL — ABNORMAL HIGH (ref 0.44–1.00)
GFR, Est AFR Am: 60 mL/min — ABNORMAL LOW (ref >60)
GFR, Estimated: 52 mL/min — ABNORMAL LOW (ref >60)
Glucose, Bld: 81 mg/dL (ref 70–99)
Potassium: 2.9 mmol/L — CL (ref 3.5–5.1)
Sodium: 140 mmol/L (ref 135–145)
Total Bilirubin: 2.6 mg/dL — ABNORMAL HIGH (ref 0.3–1.2)
Total Protein: 7.5 g/dL (ref 6.5–8.1)

## 2019-11-12 LAB — TOTAL PROTEIN, URINE DIPSTICK: Protein, ur: <30 mg/dL — AB

## 2019-11-12 LAB — TSH: TSH: 1.853 u[IU]/mL (ref 0.308–3.960)

## 2019-11-12 MED ORDER — PALONOSETRON HCL INJECTION 0.25 MG/5ML
INTRAVENOUS | Status: AC
  Filled 2019-11-12: qty 5

## 2019-11-12 MED ORDER — SODIUM CHLORIDE 0.9 % IV SOLN
Freq: Once | INTRAVENOUS | Status: AC
  Filled 2019-11-12: qty 250

## 2019-11-12 MED ORDER — POTASSIUM CHLORIDE CRYS ER 20 MEQ PO TBCR
40.0000 meq | EXTENDED_RELEASE_TABLET | Freq: Once | ORAL | Status: AC
  Administered 2019-11-12: 40 meq via ORAL

## 2019-11-12 MED ORDER — SODIUM CHLORIDE 0.9 % IV SOLN
1200.0000 mg | Freq: Once | INTRAVENOUS | Status: AC
  Administered 2019-11-12: 1200 mg via INTRAVENOUS
  Filled 2019-11-12: qty 20

## 2019-11-12 MED ORDER — POTASSIUM CHLORIDE CRYS ER 20 MEQ PO TBCR
EXTENDED_RELEASE_TABLET | ORAL | Status: AC
  Filled 2019-11-12: qty 2

## 2019-11-12 MED ORDER — SODIUM CHLORIDE 0.9 % IV SOLN
15.0000 mg/kg | Freq: Once | INTRAVENOUS | Status: AC
  Administered 2019-11-12: 800 mg via INTRAVENOUS
  Filled 2019-11-12: qty 32

## 2019-11-12 NOTE — Progress Notes (Signed)
Ok to treat w labs (Tbili and AST) per MD. Potassium Chloride 40 Meq po today entered today as requested per MD.

## 2019-11-12 NOTE — Telephone Encounter (Signed)
Critical Value K 2.9 Lenox Ponds, LPN notified

## 2019-11-12 NOTE — Progress Notes (Signed)
  [facility] OFFICE PROGRESS NOTE   Diagnosis: Hepatocellular carcinoma  INTERVAL HISTORY:   Valerie [name] completed another cycle of atezolizumab/bevacizumab on 10/23/2019.  She reports a few mild nosebleeds.  No other bleeding.  No rash or diarrhea.  She has nausea and abdominal pain in the mornings prior to eating.  She has generalized leg weakness.  Objective:  Vital signs in last 24 hours:  Blood pressure 132/89, pulse 77, temperature 98.7 F (37.1 C), resp. rate 16, weight 113 lb 6.4 oz (51.4 kg), SpO2 98 %.     Resp: Decreased breath sounds at the right posterior base, end inspiratory rhonchi at the left posterior base, no respiratory distress Cardio: Regular rate and rhythm GI: No apparent ascites, no mass, nontender, no hepatosplenomegaly Vascular: No leg edema      Lab Results:  Lab Results  Component Value Date   WBC 3.9 (L) 10/23/2019   HGB 12.9 10/23/2019   HCT 37.6 10/23/2019   MCV 91.3 10/23/2019   PLT 56 (L) 10/23/2019   NEUTROABS 2.0 10/23/2019    CMP  Lab Results  Component Value Date   NA 137 10/23/2019   K 3.1 (L) 10/23/2019   CL 101 10/23/2019   CO2 26 10/23/2019   GLUCOSE 92 10/23/2019   BUN 13 10/23/2019   CREATININE 1.01 (H) 10/23/2019   CALCIUM 8.6 (L) 10/23/2019   PROT 7.3 10/23/2019   ALBUMIN 3.0 (L) 10/23/2019   AST 123 (H) 10/23/2019   ALT 45 (H) 10/23/2019   ALKPHOS 122 10/23/2019   BILITOT 2.5 (H) 10/23/2019   GFRNONAA >60 10/23/2019   GFRAA >60 10/23/2019     Medications: I have reviewed the patient's current medications.   Assessment/Plan: 1. Liver and lung lesions concerning for metastatic cancer, ?[place] primary -CT 06/28/2019-probable cirrhosis, large heterogenous mass in the right liver extending to the right lung base with possible violation of the capsule and invasion of the right hemidiaphragm, at least 1 additional mass in the inferior right liver -06/29/2019 MRI of the abdomen-probable diffusely  infiltrative hepatocellular carcinoma throughout the right lobe of the liver which appears to extend out of the hepatic capsule and invade the right hemidiaphragm, tumor thrombus involving the right portal and hepatic veins, esophageal and proximal gastric varices -06/30/2019 CT of the chest without contrast-multiple pulmonary nodules concerning for widespread metastatic disease to the lungs, large right lobe liver mass with transdiaphragmatic invasion into the lower right hemithorax involving portions of the right lower lobe -07/01/2019 -[id number] -Cycle 1atezolizumab/bevacizumab 07/12/2019 -Cycle 2 atezolizumab/bevacizumab 08/02/2019 -Cycle 3 atezolizumab/bevacizumab 08/22/2019 -CT 08/27/2019-new ascites, decreased right liver mass with diminished diaphragmatic involvement, improved pulmonary nodules -Cycle 4 atezolizumab/bevacizumab 09/19/2019 -Cycle 5 atezolizumab/bevacizumab 10/23/2019 -Cycle 6 atezolizumab/bevacizumab 11/12/2019 2. Hepatitis C -Reports prior treatment with 16 weeks of interferon ribavirin -06/29/2019 HCV RNA quantitative 634,000 3. Cirrhosis 4. Pseudoseizures 5. Bipolar disorder 6. Anemia and thrombocytopenia 7. Elevated LFTs 8.  Admission with COVID-19 pneumonia 08/27/2019, treated with steroids and remdesivir    Disposition: Valerie [name] appears unchanged.  She is tolerating the atezolizumab/bevacizumab well.  She will complete another cycle today.  We will follow up on labs from today including a repeat AFP.  She will return for an office visit and the next cycle of systemic therapy in 3 weeks.  We will schedule a restaging CT evaluation after the next cycle of systemic therapy.  Betsy Coder, MD  11/12/2019  8:43 AM

## 2019-11-12 NOTE — Patient Instructions (Addendum)
[facility] Discharge Instructions for Patients Receiving Chemotherapy  Today you received the following chemotherapy agents: Avastin and Tecentriq  To help prevent nausea and vomiting after your treatment, we encourage you to take your nausea medication as prescribed.    If you develop nausea and vomiting that is not controlled by your nausea medication, call the clinic.   BELOW ARE SYMPTOMS THAT SHOULD BE REPORTED IMMEDIATELY:  *FEVER GREATER THAN 100.5 F  *CHILLS WITH OR WITHOUT FEVER  NAUSEA AND VOMITING THAT IS NOT CONTROLLED WITH YOUR NAUSEA MEDICATION  *UNUSUAL SHORTNESS OF BREATH  *UNUSUAL BRUISING OR BLEEDING  TENDERNESS IN MOUTH AND THROAT WITH OR WITHOUT PRESENCE OF ULCERS  *URINARY PROBLEMS  *BOWEL PROBLEMS  UNUSUAL RASH Items with * indicate a potential emergency and should be followed up as soon as possible.  Feel free to call the clinic should you have any questions or concerns. The clinic phone number is [phone].  Please show the [place] at check-in to the Emergency Department and triage nurse.    Hypokalemia Hypokalemia means that the amount of potassium in the blood is lower than normal. Potassium is a chemical (electrolyte) that helps regulate the amount of fluid in the body. It also stimulates muscle tightening (contraction) and helps nerves work properly. Normally, most of the body's potassium is inside cells, and only a very small amount is in the blood. Because the amount in the blood is so small, minor changes to potassium levels in the blood can be life-threatening. What are the causes? This condition may be caused by:  Antibiotic medicine.  Diarrhea or vomiting. Taking too much of a medicine that helps you have a bowel movement (laxative) can cause diarrhea and lead to hypokalemia.  Chronic kidney disease (CKD).  Medicines that help the body get rid of excess fluid (diuretics).  Eating disorders, such as  bulimia.  Low magnesium levels in the body.  Sweating a lot. What are the signs or symptoms? Symptoms of this condition include:  Weakness.  Constipation.  Fatigue.  Muscle cramps.  Mental confusion.  Skipped heartbeats or irregular heartbeat (palpitations).  Tingling or numbness. How is this diagnosed? This condition is diagnosed with a blood test. How is this treated? This condition may be treated by:  Taking potassium supplements by mouth.  Adjusting the medicines that you take.  Eating more foods that contain a lot of potassium. If your potassium level is very low, you may need to get potassium through an IV and be monitored in the hospital. Follow these instructions at home:   Take over-the-counter and prescription medicines only as told by your health care provider. This includes vitamins and supplements.  Eat a healthy diet. A healthy diet includes fresh fruits and vegetables, whole grains, healthy fats, and lean proteins.  If instructed, eat more foods that contain a lot of potassium. This includes: ? Nuts, such as peanuts and pistachios. ? Seeds, such as sunflower seeds and pumpkin seeds. ? Peas, lentils, and lima beans. ? Whole grain and bran cereals and breads. ? Fresh fruits and vegetables, such as apricots, avocado, bananas, cantaloupe, kiwi, oranges, tomatoes, asparagus, and potatoes. ? Orange juice. ? Tomato juice. ? Red meats. ? Yogurt.  Keep all follow-up visits as told by your health care provider. This is important. Contact a health care provider if you:  Have weakness that gets worse.  Feel your heart pounding or racing.  Vomit.  Have diarrhea.  Have diabetes (diabetes mellitus) and you  have trouble keeping your blood sugar (glucose) in your target range. Get help right away if you:  Have chest pain.  Have shortness of breath.  Have vomiting or diarrhea that lasts for more than 2 days.  Faint. Summary  Hypokalemia means that  the amount of potassium in the blood is lower than normal.  This condition is diagnosed with a blood test.  Hypokalemia may be treated by taking potassium supplements, adjusting the medicines that you take, or eating more foods that are high in potassium.  If your potassium level is very low, you may need to get potassium through an IV and be monitored in the hospital. This information is not intended to replace advice given to you by your health care provider. Make sure you discuss any questions you have with your health care provider. Document Revised: 11/08/2017 Document Reviewed: 11/08/2017 Elsevier Patient Education  [place].

## 2019-11-12 NOTE — Telephone Encounter (Addendum)
Per Dr. Stevphen Meuse to treat with ANC 1.3 and Plts 50, K 2.9, Pt to be given 40 meq of potassium during treatment and dose change in potassium to 20 meq two times a day.

## 2019-11-13 ENCOUNTER — Telehealth: Payer: Self-pay | Admitting: Oncology

## 2019-11-13 LAB — AFP TUMOR MARKER: AFP, Serum, Tumor Marker: 46911 ng/mL — ABNORMAL HIGH (ref 0.0–8.3)

## 2019-11-13 NOTE — Telephone Encounter (Signed)
Scheduled appts per 8/3 los. Pt confirmed appt date and time.

## 2019-12-03 ENCOUNTER — Inpatient Hospital Stay (HOSPITAL_BASED_OUTPATIENT_CLINIC_OR_DEPARTMENT_OTHER): Payer: Medicaid Other | Admitting: Nurse Practitioner

## 2019-12-03 ENCOUNTER — Inpatient Hospital Stay: Payer: Medicaid Other

## 2019-12-03 ENCOUNTER — Encounter: Payer: Self-pay | Admitting: Nurse Practitioner

## 2019-12-03 ENCOUNTER — Other Ambulatory Visit: Payer: Self-pay

## 2019-12-03 ENCOUNTER — Encounter: Payer: Self-pay | Admitting: *Deleted

## 2019-12-03 VITALS — BP 124/62 | HR 79 | Temp 98.1°F | Resp 18 | Ht 66.0 in | Wt 118.4 lb

## 2019-12-03 DIAGNOSIS — C220 Liver cell carcinoma: Secondary | ICD-10-CM

## 2019-12-03 DIAGNOSIS — Z5112 Encounter for antineoplastic immunotherapy: Secondary | ICD-10-CM | POA: Diagnosis not present

## 2019-12-03 LAB — CBC WITH DIFFERENTIAL (CANCER CENTER ONLY)
Abs Immature Granulocytes: 0 10*3/uL (ref 0.00–0.07)
Basophils Absolute: 0 10*3/uL (ref 0.0–0.1)
Basophils Relative: 1 %
Eosinophils Absolute: 0.1 10*3/uL (ref 0.0–0.5)
Eosinophils Relative: 2 %
HCT: 38.3 % (ref 36.0–46.0)
Hemoglobin: 13 g/dL (ref 12.0–15.0)
Immature Granulocytes: 0 %
Lymphocytes Relative: 39 %
Lymphs Abs: 1.3 10*3/uL (ref 0.7–4.0)
MCH: 31.8 pg (ref 26.0–34.0)
MCHC: 33.9 g/dL (ref 30.0–36.0)
MCV: 93.6 fL (ref 80.0–100.0)
Monocytes Absolute: 0.3 10*3/uL (ref 0.1–1.0)
Monocytes Relative: 9 %
Neutro Abs: 1.7 10*3/uL (ref 1.7–7.7)
Neutrophils Relative %: 49 %
Platelet Count: 48 10*3/uL — ABNORMAL LOW (ref 150–400)
RBC: 4.09 MIL/uL (ref 3.87–5.11)
RDW: 15.1 % (ref 11.5–15.5)
WBC Count: 3.4 10*3/uL — ABNORMAL LOW (ref 4.0–10.5)
nRBC: 0 % (ref 0.0–0.2)

## 2019-12-03 LAB — CMP (CANCER CENTER ONLY)
ALT: 29 U/L (ref 0–44)
AST: 112 U/L — ABNORMAL HIGH (ref 15–41)
Albumin: 2.8 g/dL — ABNORMAL LOW (ref 3.5–5.0)
Alkaline Phosphatase: 97 U/L (ref 38–126)
Anion gap: 9 (ref 5–15)
BUN: 19 mg/dL (ref 6–20)
CO2: 28 mmol/L (ref 22–32)
Calcium: 9.1 mg/dL (ref 8.9–10.3)
Chloride: 100 mmol/L (ref 98–111)
Creatinine: 1.33 mg/dL — ABNORMAL HIGH (ref 0.44–1.00)
GFR, Est AFR Am: 52 mL/min — ABNORMAL LOW
GFR, Estimated: 45 mL/min — ABNORMAL LOW
Glucose, Bld: 109 mg/dL — ABNORMAL HIGH (ref 70–99)
Potassium: 3.1 mmol/L — ABNORMAL LOW (ref 3.5–5.1)
Sodium: 137 mmol/L (ref 135–145)
Total Bilirubin: 2.8 mg/dL — ABNORMAL HIGH (ref 0.3–1.2)
Total Protein: 6.8 g/dL (ref 6.5–8.1)

## 2019-12-03 LAB — TOTAL PROTEIN, URINE DIPSTICK: Protein, ur: NEGATIVE mg/dL

## 2019-12-03 MED ORDER — SODIUM CHLORIDE 0.9 % IV SOLN
15.0000 mg/kg | Freq: Once | INTRAVENOUS | Status: AC
  Administered 2019-12-03: 800 mg via INTRAVENOUS
  Filled 2019-12-03: qty 32

## 2019-12-03 MED ORDER — SODIUM CHLORIDE 0.9 % IV SOLN
Freq: Once | INTRAVENOUS | Status: AC
  Filled 2019-12-03: qty 250

## 2019-12-03 MED ORDER — SODIUM CHLORIDE 0.9 % IV SOLN
1200.0000 mg | Freq: Once | INTRAVENOUS | Status: AC
  Administered 2019-12-03: 1200 mg via INTRAVENOUS
  Filled 2019-12-03: qty 20

## 2019-12-03 NOTE — Progress Notes (Signed)
Patient requesting port due to multiple sticks each treatment. Per Dr. Benay Spice: wait till CT results are seen and will then decide on continued tx/port.

## 2019-12-03 NOTE — Progress Notes (Signed)
[facility] OFFICE PROGRESS NOTE   Diagnosis: Hepatocellular carcinoma   INTERVAL HISTORY:   Valerie [name] returns as scheduled.  She completed cycle 6 atezolizumab/bevacizumab 11/12/2019.  She denies rash and diarrhea.  She has been constipated.  She has not started her laxative regimen.  She has intermittent abdominal pain.  Appetite is poor.  She has occasional nosebleeds.  Her friend Letta Median is on the phone during today's visit.  She reports Ms. Estill has had multiple falls and frequent seizures.  Ms. Caywood reports she has a seizure disorder, not currently on any seizure medication.  Last time she took a seizure medication was 3 to 5 years ago.  Objective:  Vital signs in last 24 hours:  Blood pressure 124/62, pulse 79, temperature 98.1 F (36.7 C), temperature source Oral, resp. rate 18, height 5\' 6"  (1.676 m), weight 118 lb 6.4 oz (53.7 kg), SpO2 100 %.    HEENT: Pupils equal round and reactive to light.  Extraocular movements intact. Resp: Lungs clear bilaterally. Cardio: Regular rate and rhythm. GI: Abdomen is distended, consistent with ascites. Vascular: Trace edema at the lower legs bilaterally. Neuro: Alert, oriented and appropriate.  Follows commands.  Motor strength 5/5. Skin: No rash.   Lab Results:  Lab Results  Component Value Date   WBC 3.4 (L) 12/03/2019   HGB 13.0 12/03/2019   HCT 38.3 12/03/2019   MCV 93.6 12/03/2019   PLT 48 (L) 12/03/2019   NEUTROABS 1.7 12/03/2019    Imaging:  No results found.  Medications: I have reviewed the patient's current medications.  Assessment/Plan: 1.Liver and lung lesions concerning for metastatic cancer, ?[place] primary -CT 06/28/2019-probable cirrhosis, large heterogenous mass in the right liver extending to the right lung base with possible violation of the capsule and invasion of the right hemidiaphragm, at least 1 additional mass in the inferior right liver -06/29/2019 MRI of the abdomen-probable diffusely  infiltrative hepatocellular carcinoma throughout the right lobe of the liver which appears to extend out of the hepatic capsule and invade the right hemidiaphragm, tumor thrombus involving the right portal and hepatic veins, esophageal and proximal gastric varices -06/30/2019 CT of the chest without contrast-multiple pulmonary nodules concerning for widespread metastatic disease to the lungs, large right lobe liver mass with transdiaphragmatic invasion into the lower right hemithorax involving portions of the right lower lobe -07/01/2019 -[id number] -Cycle 1atezolizumab/bevacizumab 07/12/2019 -Cycle 2 atezolizumab/bevacizumab 08/02/2019 -Cycle 3 atezolizumab/bevacizumab 08/22/2019 -CT 08/27/2019-new ascites, decreased right liver mass with diminished diaphragmatic involvement, improved pulmonary nodules -Cycle 4 atezolizumab/bevacizumab 09/19/2019 -Cycle 5 atezolizumab/bevacizumab 10/23/2019 -Cycle 6 atezolizumab/bevacizumab 11/12/2019 2. Hepatitis C -Reports prior treatment with 16 weeks of interferon ribavirin -06/29/2019 HCV RNA quantitative 634,000 3. Cirrhosis 4. Pseudoseizures 5. Bipolar disorder 6. Anemia and thrombocytopenia 7. Elevated LFTs 8.Admission with COVID-19 pneumonia 08/27/2019, treated with steroids and remdesivir    Disposition: Ms. Harris appears stable.  She has completed 6 cycles of atezolizumab/bevacizumab.  Plan to proceed with cycle 7 today as scheduled.  She will undergo restaging CTs prior to cycle 8.  We reviewed the CBC and chemistry panel from today.  Labs are adequate to proceed with treatment.  She has stable thrombocytopenia.  Bilirubin with overall stable elevation.  Creatinine slightly higher.  She has hypokalemia and will begin a daily potassium supplement.  Urine protein is negative.  She has a history of pseudoseizures.  She will schedule an appointment with her PCP.  On exam she has recurrent ascites.  We are referring her for a paracentesis.  She  will return for follow-up labs in 1 week.  Restaging CTs in approximately 2 weeks pending lab results.  Follow-up appointment in 3 weeks.  Plan reviewed with Dr. Benay Spice.    Ned Card ANP/GNP-BC   12/03/2019  12:57 PM

## 2019-12-03 NOTE — Patient Instructions (Signed)
[facility] Discharge Instructions for Patients Receiving Chemotherapy  Today you received the following chemotherapy agents: Avastin and Tecentriq  To help prevent nausea and vomiting after your treatment, we encourage you to take your nausea medication as prescribed.    If you develop nausea and vomiting that is not controlled by your nausea medication, call the clinic.   BELOW ARE SYMPTOMS THAT SHOULD BE REPORTED IMMEDIATELY:  *FEVER GREATER THAN 100.5 F  *CHILLS WITH OR WITHOUT FEVER  NAUSEA AND VOMITING THAT IS NOT CONTROLLED WITH YOUR NAUSEA MEDICATION  *UNUSUAL SHORTNESS OF BREATH  *UNUSUAL BRUISING OR BLEEDING  TENDERNESS IN MOUTH AND THROAT WITH OR WITHOUT PRESENCE OF ULCERS  *URINARY PROBLEMS  *BOWEL PROBLEMS  UNUSUAL RASH Items with * indicate a potential emergency and should be followed up as soon as possible.  Feel free to call the clinic should you have any questions or concerns. The clinic phone number is [phone].  Please show the [facility] at check-in to the Emergency Department and triage nurse.

## 2019-12-04 ENCOUNTER — Telehealth: Payer: Self-pay | Admitting: Nurse Practitioner

## 2019-12-04 NOTE — Telephone Encounter (Signed)
Scheduled appointments per 8/24 los. Patient is aware of appointments dates and times.

## 2019-12-05 ENCOUNTER — Telehealth: Payer: Self-pay

## 2019-12-05 ENCOUNTER — Ambulatory Visit (HOSPITAL_COMMUNITY)
Admission: RE | Admit: 2019-12-05 | Discharge: 2019-12-05 | Disposition: A | Discharge status: Home / Self Care | Payer: Medicaid Other | Source: Ambulatory Visit | Attending: Nurse Practitioner | Admitting: Nurse Practitioner

## 2019-12-05 ENCOUNTER — Other Ambulatory Visit: Payer: Self-pay

## 2019-12-05 DIAGNOSIS — Z79899 Other long term (current) drug therapy: Secondary | ICD-10-CM | POA: Diagnosis not present

## 2019-12-05 DIAGNOSIS — F17210 Nicotine dependence, cigarettes, uncomplicated: Secondary | ICD-10-CM | POA: Diagnosis not present

## 2019-12-05 DIAGNOSIS — C220 Liver cell carcinoma: Secondary | ICD-10-CM | POA: Insufficient documentation

## 2019-12-05 DIAGNOSIS — R112 Nausea with vomiting, unspecified: Secondary | ICD-10-CM | POA: Diagnosis not present

## 2019-12-05 DIAGNOSIS — R109 Unspecified abdominal pain: Secondary | ICD-10-CM | POA: Diagnosis present

## 2019-12-05 DIAGNOSIS — R079 Chest pain, unspecified: Secondary | ICD-10-CM | POA: Diagnosis not present

## 2019-12-05 DIAGNOSIS — R188 Other ascites: Secondary | ICD-10-CM | POA: Diagnosis not present

## 2019-12-05 DIAGNOSIS — K7460 Unspecified cirrhosis of liver: Secondary | ICD-10-CM | POA: Diagnosis not present

## 2019-12-05 DIAGNOSIS — Z20822 Contact with and (suspected) exposure to covid-19: Secondary | ICD-10-CM | POA: Diagnosis not present

## 2019-12-05 DIAGNOSIS — E876 Hypokalemia: Secondary | ICD-10-CM | POA: Diagnosis not present

## 2019-12-05 MED ORDER — LIDOCAINE HCL 1 % IJ SOLN
INTRAMUSCULAR | Status: AC
  Filled 2019-12-05: qty 20

## 2019-12-05 NOTE — Telephone Encounter (Signed)
Does patient qualify for PCS services.

## 2019-12-05 NOTE — Procedures (Signed)
Ultrasound-guided diagnostic and therapeutic paracentesis performed yielding 4 liters (maximum ordered) of amber colored fluid. No immediate complications. The fluid was sent to the lab for cytology. EBL none.

## 2019-12-06 ENCOUNTER — Other Ambulatory Visit: Payer: Self-pay

## 2019-12-06 ENCOUNTER — Emergency Department (HOSPITAL_COMMUNITY): Payer: Medicaid Other

## 2019-12-06 ENCOUNTER — Inpatient Hospital Stay (HOSPITAL_COMMUNITY): Payer: Medicaid Other

## 2019-12-06 ENCOUNTER — Encounter (HOSPITAL_COMMUNITY): Payer: Self-pay | Admitting: Emergency Medicine

## 2019-12-06 ENCOUNTER — Observation Stay (HOSPITAL_COMMUNITY)
Admission: EM | Admit: 2019-12-06 | Discharge: 2019-12-06 | Disposition: A | Discharge status: Home / Self Care | Payer: Medicaid Other | Attending: Internal Medicine | Admitting: Internal Medicine

## 2019-12-06 DIAGNOSIS — K7460 Unspecified cirrhosis of liver: Secondary | ICD-10-CM | POA: Diagnosis present

## 2019-12-06 DIAGNOSIS — R112 Nausea with vomiting, unspecified: Secondary | ICD-10-CM | POA: Insufficient documentation

## 2019-12-06 DIAGNOSIS — Z20822 Contact with and (suspected) exposure to covid-19: Secondary | ICD-10-CM | POA: Insufficient documentation

## 2019-12-06 DIAGNOSIS — R188 Other ascites: Principal | ICD-10-CM | POA: Insufficient documentation

## 2019-12-06 DIAGNOSIS — R109 Unspecified abdominal pain: Secondary | ICD-10-CM | POA: Diagnosis present

## 2019-12-06 DIAGNOSIS — F17210 Nicotine dependence, cigarettes, uncomplicated: Secondary | ICD-10-CM | POA: Insufficient documentation

## 2019-12-06 DIAGNOSIS — R079 Chest pain, unspecified: Secondary | ICD-10-CM | POA: Insufficient documentation

## 2019-12-06 DIAGNOSIS — C220 Liver cell carcinoma: Secondary | ICD-10-CM | POA: Diagnosis not present

## 2019-12-06 DIAGNOSIS — D696 Thrombocytopenia, unspecified: Secondary | ICD-10-CM | POA: Diagnosis present

## 2019-12-06 DIAGNOSIS — Z79899 Other long term (current) drug therapy: Secondary | ICD-10-CM | POA: Insufficient documentation

## 2019-12-06 DIAGNOSIS — E876 Hypokalemia: Secondary | ICD-10-CM | POA: Insufficient documentation

## 2019-12-06 HISTORY — PX: IR PARACENTESIS: IMG2679

## 2019-12-06 HISTORY — DX: Liver cell carcinoma: C22.0

## 2019-12-06 LAB — CBC WITH DIFFERENTIAL/PLATELET
Abs Immature Granulocytes: 0.01 10*3/uL (ref 0.00–0.07)
Basophils Absolute: 0 10*3/uL (ref 0.0–0.1)
Basophils Relative: 1 %
Eosinophils Absolute: 0 10*3/uL (ref 0.0–0.5)
Eosinophils Relative: 1 %
HCT: 40.7 % (ref 36.0–46.0)
Hemoglobin: 13.3 g/dL (ref 12.0–15.0)
Immature Granulocytes: 0 %
Lymphocytes Relative: 23 %
Lymphs Abs: 0.8 10*3/uL (ref 0.7–4.0)
MCH: 31.1 pg (ref 26.0–34.0)
MCHC: 32.7 g/dL (ref 30.0–36.0)
MCV: 95.1 fL (ref 80.0–100.0)
Monocytes Absolute: 0.3 10*3/uL (ref 0.1–1.0)
Monocytes Relative: 8 %
Neutro Abs: 2.2 10*3/uL (ref 1.7–7.7)
Neutrophils Relative %: 67 %
Platelets: 45 10*3/uL — ABNORMAL LOW (ref 150–400)
RBC: 4.28 MIL/uL (ref 3.87–5.11)
RDW: 15 % (ref 11.5–15.5)
WBC: 3.3 10*3/uL — ABNORMAL LOW (ref 4.0–10.5)
nRBC: 0 % (ref 0.0–0.2)

## 2019-12-06 LAB — COMPREHENSIVE METABOLIC PANEL
ALT: 28 U/L (ref 0–44)
AST: 103 U/L — ABNORMAL HIGH (ref 15–41)
Albumin: 2.5 g/dL — ABNORMAL LOW (ref 3.5–5.0)
Alkaline Phosphatase: 75 U/L (ref 38–126)
Anion gap: 17 — ABNORMAL HIGH (ref 5–15)
BUN: 16 mg/dL (ref 6–20)
CO2: 24 mmol/L (ref 22–32)
Calcium: 8.6 mg/dL — ABNORMAL LOW (ref 8.9–10.3)
Chloride: 96 mmol/L — ABNORMAL LOW (ref 98–111)
Creatinine, Ser: 1.37 mg/dL — ABNORMAL HIGH (ref 0.44–1.00)
GFR calc Af Amer: 51 mL/min — ABNORMAL LOW (ref >60)
GFR calc non Af Amer: 44 mL/min — ABNORMAL LOW (ref >60)
Glucose, Bld: 103 mg/dL — ABNORMAL HIGH (ref 70–99)
Potassium: 2.6 mmol/L — CL (ref 3.5–5.1)
Sodium: 137 mmol/L (ref 135–145)
Total Bilirubin: 2.8 mg/dL — ABNORMAL HIGH (ref 0.3–1.2)
Total Protein: 6.4 g/dL — ABNORMAL LOW (ref 6.5–8.1)

## 2019-12-06 LAB — SARS CORONAVIRUS 2 BY RT PCR (HOSPITAL ORDER, PERFORMED IN ~~LOC~~ HOSPITAL LAB): SARS Coronavirus 2: NEGATIVE

## 2019-12-06 LAB — TROPONIN I (HIGH SENSITIVITY)
Troponin I (High Sensitivity): 2 ng/L (ref <18)
Troponin I (High Sensitivity): 3 ng/L (ref <18)

## 2019-12-06 LAB — MAGNESIUM: Magnesium: 2.2 mg/dL (ref 1.7–2.4)

## 2019-12-06 LAB — PROTIME-INR
INR: 1.6 — ABNORMAL HIGH (ref 0.8–1.2)
Prothrombin Time: 18.2 seconds — ABNORMAL HIGH (ref 11.4–15.2)

## 2019-12-06 LAB — LIPASE, BLOOD: Lipase: 69 U/L — ABNORMAL HIGH (ref 11–51)

## 2019-12-06 LAB — CYTOLOGY - NON PAP

## 2019-12-06 MED ORDER — SODIUM CHLORIDE 0.9 % IV BOLUS
500.0000 mL | Freq: Once | INTRAVENOUS | Status: AC
  Administered 2019-12-06: 500 mL via INTRAVENOUS

## 2019-12-06 MED ORDER — LIDOCAINE HCL 1 % IJ SOLN
INTRAMUSCULAR | Status: AC
  Filled 2019-12-06: qty 20

## 2019-12-06 MED ORDER — LIDOCAINE HCL (PF) 1 % IJ SOLN
INTRAMUSCULAR | Status: AC | PRN
  Administered 2019-12-06: 10 mL

## 2019-12-06 MED ORDER — HYDROMORPHONE HCL 1 MG/ML IJ SOLN
1.0000 mg | Freq: Once | INTRAMUSCULAR | Status: AC
  Administered 2019-12-06: 1 mg via INTRAVENOUS
  Filled 2019-12-06: qty 1

## 2019-12-06 MED ORDER — POTASSIUM CHLORIDE CRYS ER 20 MEQ PO TBCR
40.0000 meq | EXTENDED_RELEASE_TABLET | Freq: Once | ORAL | Status: AC
  Administered 2019-12-06: 40 meq via ORAL
  Filled 2019-12-06: qty 2

## 2019-12-06 MED ORDER — ONDANSETRON HCL 4 MG/2ML IJ SOLN
4.0000 mg | Freq: Once | INTRAMUSCULAR | Status: AC
  Administered 2019-12-06: 4 mg via INTRAVENOUS
  Filled 2019-12-06: qty 2

## 2019-12-06 MED ORDER — POTASSIUM CHLORIDE 10 MEQ/100ML IV SOLN
10.0000 meq | INTRAVENOUS | Status: AC
  Administered 2019-12-06 (×2): 10 meq via INTRAVENOUS
  Filled 2019-12-06 (×2): qty 100

## 2019-12-06 MED ORDER — MAGNESIUM SULFATE 2 GM/50ML IV SOLN
2.0000 g | Freq: Once | INTRAVENOUS | Status: AC
  Administered 2019-12-06: 2 g via INTRAVENOUS
  Filled 2019-12-06: qty 50

## 2019-12-06 MED ORDER — IOHEXOL 350 MG/ML SOLN
100.0000 mL | Freq: Once | INTRAVENOUS | Status: AC | PRN
  Administered 2019-12-06: 75 mL via INTRAVENOUS

## 2019-12-06 NOTE — ED Notes (Signed)
Pt is back in her room in the ED.

## 2019-12-06 NOTE — ED Triage Notes (Signed)
Pt arrives from home with c/o of abd pain hx of liver and lung CA had fluid drained from her abdomen yesterday. Ems reports possible seizure activity the only lasted a minute with "facial twitching and hands tensed up" pt was not incontinent no oral trauma. Pt was never postictal, pt arrives to ED alert and ox4. C/o abd pain.    20GLFA started by ems.

## 2019-12-06 NOTE — ED Provider Notes (Signed)
[facility] EMERGENCY DEPARTMENT Provider Note   CSN: [id number] Arrival date & time: 12/06/19  5188     History Chief Complaint  Patient presents with  . Abdominal Pain    Valerie [name] is a 54 y.o. female.  HPI   54 year old female with history of bipolar disorder, hep C, cirrhosis, hepatocellular carcinoma with mets, pseudoseizures, who presents to the emergency department today complaining of chest pain and abdominal pain.  States about 2.5 hours prior to arrival she had sudden onset pain to the mid chest that radiated to the right side of her chest.  She also has pain with inspiration and shortness of breath.  At the same time she also experienced abdominal pain to the entire abdomen but worse to the upper abdomen.  She has associated nausea but no vomiting.  She had been constipated for the last week but took MiraLAX and Senokot yesterday and was able to have a bowel movement.  She has not had any fevers.  She typically does have pain from her cancer but it is not this severe.  She tried taking her home Dilaudid without any relief.  Additionally, she states that she has been off balance for the last month.  reviewed chart, patient had an IR paracentesis yesterday and had 4 L drained.  She states since having this procedure she has had recurrence of abdominal distention since yesterday.  Additional history obtained from EMS. They states that patient had a possible seizure activity that lasted for about a minute and included facial twitching and tensing up of the hands. Patient was not incontinent and there was no evidence of tongue biting. She did not have a postictal state.  Past Medical History:  Diagnosis Date  . Bipolar disorder ([place])   . Hepatitis C   . Liver cirrhosis ([place])   . Pseudoseizures     Patient Active Problem List   Diagnosis Date Noted  . Pneumonia due to COVID-19 virus 09/05/2019  . Neutropenia ([place]) 08/27/2019  . Febrile neutropenia ([place])  08/27/2019  . COVID-19 08/27/2019  . Encounter for antineoplastic immunotherapy 07/12/2019  . Encounter for antineoplastic chemotherapy 07/12/2019  . Hepatocellular carcinoma ([place]) 07/04/2019  . Goals of care, counseling/discussion 07/04/2019  . Liver mass   . Fever 06/29/2019  . SIRS (systemic inflammatory response syndrome) ([place]) 06/29/2019  . Abdominal pain 06/29/2019  . Hepatitis C 11/27/2016  . Thrombocytopenia ([place]) 11/27/2016  . Liver cirrhosis ([place]) 11/27/2016  . Elevated liver enzymes 11/27/2016  . Migraine 11/27/2016  . Hemiplegic migraine 11/27/2016  . Marijuana abuse 11/27/2016  . Normocytic anemia 11/27/2016  . Right sided weakness 11/26/2016    Past Surgical History:  Procedure Laterality Date  . CESAREAN SECTION       OB History   No obstetric history on file.     Family History  Problem Relation Age of Onset  . Heart disease Neg Hx     Social History   Tobacco Use  . Smoking status: Current Every Day Smoker    Packs/day: 0.50    Types: Cigarettes  . Smokeless tobacco: Never Used  Substance Use Topics  . Alcohol use: No  . Drug use: No    Home Medications Prior to Admission medications   Medication Sig Start Date End Date Taking? Authorizing Provider  albuterol (VENTOLIN HFA) 108 (90 Base) MCG/ACT inhaler Inhale 1 puff into the lungs every 4 (four) hours as needed for wheezing or shortness of breath. 08/31/19  Yes Arrien, Jimmy Picket,  MD  FLUoxetine (PROZAC) 40 MG capsule Take 1 capsule (40 mg total) by mouth daily. 09/24/19 12/06/19 Yes Charlott Rakes, MD  hydrochlorothiazide (HYDRODIURIL) 25 MG tablet Take 1 tablet (25 mg total) by mouth daily. 09/24/19 12/06/19 Yes Charlott Rakes, MD  HYDROmorphone (DILAUDID) 2 MG tablet Take 1 tablet (2 mg total) by mouth every 4 (four) hours as needed for severe pain. 11/01/19  Yes Ladell Pier, MD  loperamide (IMODIUM A-D) 2 MG tablet Take 2-4 mg by mouth 4 (four) times daily as needed.   Yes [provider]  Nutritional Supplements (ENSURE COMPLETE PO) Take 1 Can by mouth 3 (three) times daily.   Yes [provider]  ondansetron (ZOFRAN) 8 MG tablet Take 1 tablet (8 mg total) by mouth every 8 (eight) hours as needed for nausea or vomiting. 10/31/19  Yes Ladell Pier, MD  polyethylene glycol (MIRALAX / GLYCOLAX) 17 g packet Take 17 g by mouth daily as needed for mild constipation.   Yes [provider]  potassium chloride SA (KLOR-CON) 20 MEQ tablet Take 1 tablet (20 mEq total) by mouth daily. 10/23/19  Yes Owens Shark, NP  sennosides-docusate sodium (SENOKOT-S) 8.6-50 MG tablet Take 1-2 tablets by mouth 2 (two) times daily as needed. 08/02/19  Yes [provider]  pantoprazole (PROTONIX) 40 MG tablet Take 1 tablet (40 mg total) by mouth daily. 09/24/19 10/24/19  Charlott Rakes, MD    Allergies    Norco [hydrocodone-acetaminophen] and Tylenol [acetaminophen]  Review of Systems   Review of Systems  Constitutional: Negative for fever.  HENT: Negative for ear pain and sore throat.   Eyes: Negative for visual disturbance.  Respiratory: Positive for shortness of breath.        Denies hemoptysis  Cardiovascular: Positive for chest pain. Negative for leg swelling.  Gastrointestinal: Positive for abdominal pain, constipation and nausea. Negative for vomiting.  Genitourinary: Negative for dysuria and hematuria.  Musculoskeletal: Negative for back pain.  Skin: Negative for rash.  Neurological: Negative for headaches.  All other systems reviewed and are negative.   Physical Exam Updated Vital Signs BP 107/74   Pulse 87   Temp (!) 97.5 F (36.4 C) (Oral)   Resp 20   SpO2 98%   Physical Exam Vitals and nursing note reviewed.  Constitutional:      General: She is in acute distress (rolling around in bed in pain).     Appearance: She is well-developed.  HENT:     Head: Normocephalic and atraumatic.  Eyes:     Conjunctiva/sclera: Conjunctivae  normal.  Cardiovascular:     Rate and Rhythm: Normal rate and regular rhythm.     Heart sounds: Normal heart sounds. No murmur heard.   Pulmonary:     Effort: Pulmonary effort is normal. No respiratory distress.     Breath sounds: Normal breath sounds. No wheezing, rhonchi or rales.  Abdominal:     General: There is distension.     Palpations: Abdomen is soft.     Tenderness: There is generalized abdominal tenderness (worse to the upper abd).  Musculoskeletal:     Cervical back: Neck supple.  Skin:    General: Skin is warm and dry.  Neurological:     Mental Status: She is alert.     Comments: Alert. Clear speech, no facial droop. Moving all extremities purposefully.      ED Results / Procedures / Treatments   Labs (all labs ordered are listed, but only abnormal results  are displayed) Labs Reviewed  CBC WITH DIFFERENTIAL/PLATELET - Abnormal; Notable for the following components:      Result Value   WBC 3.3 (*)    Platelets 45 (*)    All other components within normal limits  COMPREHENSIVE METABOLIC PANEL - Abnormal; Notable for the following components:   Potassium 2.6 (*)    Chloride 96 (*)    Glucose, Bld 103 (*)    Creatinine, Ser 1.37 (*)    Calcium 8.6 (*)    Total Protein 6.4 (*)    Albumin 2.5 (*)    AST 103 (*)    Total Bilirubin 2.8 (*)    GFR calc non Af Amer 44 (*)    GFR calc Af Amer 51 (*)    Anion gap 17 (*)    All other components within normal limits  LIPASE, BLOOD - Abnormal; Notable for the following components:   Lipase 69 (*)    All other components within normal limits  PROTIME-INR - Abnormal; Notable for the following components:   Prothrombin Time 18.2 (*)    INR 1.6 (*)    All other components within normal limits  SARS CORONAVIRUS 2 BY RT PCR (HOSPITAL ORDER, [place] [facility])  MAGNESIUM  URINALYSIS, ROUTINE W REFLEX MICROSCOPIC  TROPONIN I (HIGH SENSITIVITY)  TROPONIN I (HIGH SENSITIVITY)    EKG EKG  Interpretation  Date/Time:  Friday December 06 2019 10:36:45 EDT Ventricular Rate:  91 PR Interval:    QRS Duration: 102 QT Interval:  426 QTC Calculation: 525 R Axis:   72 Text Interpretation: Sinus rhythm Probable LVH with secondary repol abnrm Prolonged QT interval Confirmed by Quintella Reichert [phone]) on 12/06/2019 10:43:31 AM   Radiology DG Chest 2 View  Result Date: 12/06/2019 CLINICAL DATA:  Shortness of breath EXAM: CHEST - 2 VIEW COMPARISON:  09/06/2019 FINDINGS: Decreased now mild elevation of the right hemidiaphragm. Trace right pleural effusion. Small area of increased density at the right lung base. Lung nodules on prior CT are not visualized. No pneumothorax. Normal heart size. IMPRESSION: Decreased elevation of the right hemidiaphragm. Persistent small area of increased density at the right lung base, which may related to previously seen adjacent liver mass. Trace right pleural effusion. Electronically Signed   By: Macy Mis M.D.   On: 12/06/2019 10:23   CT Head Wo Contrast  Result Date: 12/06/2019 CLINICAL DATA:  [age] see a, nontraumatic, history lung cancer, 1 day post paracentesis, LEFT upper chest pain and diffuse abdominal pain with nausea and vomiting EXAM: CT HEAD WITHOUT CONTRAST TECHNIQUE: Contiguous axial images were obtained from the base of the skull through the vertex without intravenous contrast. Sagittal and coronal MPR images reconstructed from axial data set. COMPARISON:  06/29/2019 FINDINGS: Brain: Normal ventricular morphology. No midline shift or mass effect. Normal appearance of brain parenchyma. No intracranial hemorrhage, mass lesion, evidence of acute infarction, or extra-axial fluid collection. Vascular: Atherosclerotic calcification of internal carotid arteries at skull base. No hyperdense vessels. Skull: Intact Sinuses/Orbits: Clear Other: N/A IMPRESSION: No acute intracranial abnormalities. Electronically Signed   By: Lavonia Dana M.D.   On:  12/06/2019 12:54   CT Angio Chest PE W and/or Wo Contrast  Result Date: 12/06/2019 CLINICAL DATA:  PE suspected, history of hepatocellular carcinoma, lung metastases, left chest pain EXAM: CT ANGIOGRAPHY CHEST WITH CONTRAST TECHNIQUE: Multidetector CT imaging of the chest was performed using the standard protocol during bolus administration of intravenous contrast. Multiplanar CT image reconstructions and MIPs were  obtained to evaluate the vascular anatomy. CONTRAST:  29mL OMNIPAQUE IOHEXOL 350 MG/ML SOLN COMPARISON:  CT chest, 06/30/2019 FINDINGS: Cardiovascular: Satisfactory opacification of the pulmonary arteries to the segmental level. No evidence of pulmonary embolism. Normal heart size. No pericardial effusion. Aortic atherosclerosis. Mediastinum/Nodes: No enlarged mediastinal, hilar, or axillary lymph nodes. Thyroid gland, trachea, and esophagus demonstrate no significant findings. Lungs/Pleura: Numerous bilateral pulmonary nodules are almost completely resolved in comparison to prior examination, with occasional small residual pulmonary nodules, for example a 3 mm nodule of the left lung base which previously measured 9 mm (series 11, image 61). Scarring and or atelectasis at the right lung base, similar to prior examination, with trace right pleural effusion. Upper Abdomen: Large volume ascites, cirrhosis, and splenomegaly in the included upper abdomen, separately reported. Musculoskeletal: No chest wall abnormality. No acute or significant osseous findings. Review of the MIP images confirms the above findings. IMPRESSION: 1. Negative examination for pulmonary embolism. 2. Numerous bilateral pulmonary nodules are almost completely resolved in comparison to prior examination, with occasional small residual pulmonary nodules. Findings are consistent with treatment response of pulmonary metastatic disease. 3. Scarring and or atelectasis at the right lung base, similar to prior examination, with trace right  pleural effusion. 4.  Aortic Atherosclerosis (ICD10-I70.0). 5. Large volume ascites, cirrhosis, and splenomegaly in the included upper abdomen, separately reported. Electronically Signed   By: Eddie Candle M.D.   On: 12/06/2019 13:03   CT ABDOMEN PELVIS W CONTRAST  Result Date: 12/06/2019 CLINICAL DATA:  Hepatocellular carcinoma, worsening abdominal pain, paracentesis yesterday EXAM: CT ABDOMEN AND PELVIS WITH CONTRAST TECHNIQUE: Multidetector CT imaging of the abdomen and pelvis was performed using the standard protocol following bolus administration of intravenous contrast. CONTRAST:  37mL OMNIPAQUE IOHEXOL 350 MG/ML SOLN COMPARISON:  08/27/2019 FINDINGS: Lower chest: No acute abnormality, separately reported. Hepatobiliary: Redemonstrated cirrhotic morphology of the liver, with a large, ill-defined hypodense mass of the posterior right lobe of the liver, involving hepatic segments VI and VII. Although difficult to accurately measure given ill definition and single phase of contrast on this examination, this is not significantly changed compared to prior examination, measuring approximately 8.4 x 6.1 cm (series 4, image 18). There is new thrombus which appears to occlude the right portal vein and extends into the proximal left portal vein (series 4, image 29). No gallstones, gallbladder wall thickening, or biliary dilatation. Pancreas: Unremarkable. No pancreatic ductal dilatation or surrounding inflammatory changes. Spleen: Mild splenomegaly, maximum span 13.1 cm. Adrenals/Urinary Tract: Adrenal glands are unremarkable. Kidneys are normal, without renal calculi, solid lesion, or hydronephrosis. Bladder is unremarkable. Stomach/Bowel: Stomach is within normal limits. Appendix appears normal. No evidence of bowel wall thickening, distention, or inflammatory changes. Vascular/Lymphatic: Aortic atherosclerosis. No enlarged abdominal or pelvic lymph nodes. Reproductive: No mass or other significant abnormality.  Other: No abdominal wall hernia or abnormality. Large volume ascites, similar to prior examination. Musculoskeletal: No acute or significant osseous findings. IMPRESSION: 1. Redemonstrated cirrhotic morphology of the liver. 2. Large, ill-defined hypodense mass of the posterior right lobe of the liver, involving hepatic segments VI and VII. Although difficult to accurately measure given ill definition and single phase of contrast on this examination, this is not significantly changed compared to prior examination and in keeping with known diagnosis of hepatocellular carcinoma. 3. There is new thrombus which appears to occlude the right portal vein and extends into the proximal left portal vein, very likely tumor thrombus in the setting of hepatocellular carcinoma. 4. Large volume ascites, similar to prior  examination. 5. Mild splenomegaly. 6. Aortic Atherosclerosis (ICD10-I70.0). Electronically Signed   By: Eddie Candle M.D.   On: 12/06/2019 13:12   US Paracentesis  Result Date: 12/05/2019 INDICATION: Patient with history of hepatitis-C, hepatocellular carcinoma, recurrent ascites, previous COVID-19 in May of this year. Request received for diagnostic and therapeutic paracentesis up to 4 liters. EXAM: ULTRASOUND GUIDED DIAGNOSTIC AND THERAPEUTIC PARACENTESIS MEDICATIONS: 1% lidocaine to skin and subcutaneous tissue COMPLICATIONS: None immediate. PROCEDURE: Informed written consent was obtained from the patient after a discussion of the risks, benefits and alternatives to treatment. A timeout was performed prior to the initiation of the procedure. Initial ultrasound scanning demonstrates a moderate to large amount of ascites within the left lower abdominal quadrant. The left lower abdomen was prepped and draped in the usual sterile fashion. 1% lidocaine was used for local anesthesia. Following this, a 19 gauge, 7-cm, Yueh catheter was introduced. An ultrasound image was saved for documentation purposes. The  paracentesis was performed. The catheter was removed and a dressing was applied. The patient tolerated the procedure well without immediate post procedural complication. FINDINGS: A total of approximately 4 liters of amber fluid was removed. Samples were sent to the laboratory as requested by the clinical team. IMPRESSION: Successful ultrasound-guided diagnostic and therapeutic paracentesis yielding 4 liters of peritoneal fluid. Read by: Rowe Robert, PA-C Electronically Signed   By: Jerilynn Mages.  Shick M.D.   On: 12/05/2019 12:18    Procedures Procedures (including critical care time) CRITICAL CARE Performed by: Rodney Booze   Total critical care time: 35 minutes  Critical care time was exclusive of separately billable procedures and treating other patients.  Critical care was necessary to treat or prevent imminent or life-threatening deterioration.  Critical care was time spent personally by me on the following activities: development of treatment plan with patient and/or surrogate as well as nursing, discussions with consultants, evaluation of patient's response to treatment, examination of patient, obtaining history from patient or surrogate, ordering and performing treatments and interventions, ordering and review of laboratory studies, ordering and review of radiographic studies, pulse oximetry and re-evaluation of patient's condition.   Medications Ordered in ED Medications  sodium chloride 0.9 % bolus 500 mL (0 mLs Intravenous Stopped 12/06/19 1124)  HYDROmorphone (DILAUDID) injection 1 mg (1 mg Intravenous Given 12/06/19 1059)  ondansetron (ZOFRAN) injection 4 mg (4 mg Intravenous Given 12/06/19 1058)  potassium chloride 10 mEq in 100 mL IVPB (10 mEq Intravenous New Bag/Given (Non-Interop) 12/06/19 1347)  magnesium sulfate IVPB 2 g 50 mL (0 g Intravenous Stopped 12/06/19 1346)  potassium chloride SA (KLOR-CON) CR tablet 40 mEq (40 mEq Oral Given 12/06/19 1257)  iohexol (OMNIPAQUE) 350 MG/ML  injection 100 mL (75 mLs Intravenous Contrast Given 12/06/19 1249)    ED Course  I have reviewed the triage vital signs and the nursing notes.  Pertinent labs & imaging results that were available during my care of the patient were reviewed by me and considered in my medical decision making (see chart for details).    MDM Rules/Calculators/A&P                          54 year old female with history of hepatocellular carcinoma presenting for evaluation abdominal pain and right lower chest pain.  Reviewed/interpreted labs  Cbc with leukopenia and thrombocytopenia CMP with hypokalemia, Cr at baseline, liver enzymes and bili at baseline Lipase wnl ptinr worsening Trop neg covid neg ua pending  EKG with prolonged QTc, Sinus  rhythm Probable LVH with secondary repol   CXR -  Decreased elevation of the right hemidiaphragm. Persistent small area of increased density at the right lung base, which may related to previously seen adjacent liver mass. Trace right pleural effusion  CT head -  No acute intracranial abnormalities.   CT chest/abd/pelvis -  1. Redemonstrated cirrhotic morphology of the liver. 2. Large, ill-defined hypodense mass of the posterior right lobe of the liver, involving hepatic segments VI and VII. Although difficult to accurately measure given ill definition and single phase of contrast on this examination, this is not significantly changed compared to prior examination and in keeping with known diagnosis of hepatocellular carcinoma. 3. There is new thrombus which appears to occlude the right portal vein and extends into the proximal left portal vein, very likely tumor thrombus in the setting of hepatocellular carcinoma. 4. Large volume ascites, similar to prior examination. 5. Mild splenomegaly. 6. Aortic Atherosclerosis   3:10 PM CONSULT with Dr. Benay Spice with oncology. He went over the scans with Dr. Laqueta Carina with radiology. They noted varices on the CT scan and feel that  this portal vein thrombosis is related to tumor burden rather than simple clotting. Given her varices, thrombocytopenia, and worsening INR, he would not recommend anticoagulation given her bleeding risk. He is in agreement with the plan for admission given her other findings today. He states oncology will consult and he will see the patient on Monday. If problems/concerns arise sooner, then the on call oncologist will be available for consultation. He recommended GI consultation in regards to tx of pts advanced cirrhosis.  Will plan for admission for hypokalemia. Pt would benefit from paracentesis which has been ordered.   3:31 PM CONSULT With Dr. Lorin Mercy who accepts patient for admission.   Final Clinical Impression(s) / ED Diagnoses Final diagnoses:  Ascites  Hypokalemia    Rx / DC Orders ED Discharge Orders    None       Bishop Dublin 12/06/19 1532    Quintella Reichert, MD 12/06/19 1558

## 2019-12-06 NOTE — ED Notes (Signed)
EDP Reese notified about the critical Potassium of 2.6.

## 2019-12-06 NOTE — Consult Note (Signed)
ER Consult Note   Valerie [name] OVF:[id number] DOB: 17-Jan-1966 DOA: 12/06/2019  PCP: Charlott Rakes, MD Consultants:  Benay Spice - oncology Patient coming from:  Home - lives with daughter and 2 grandsons; NOK: Esperanza Sheets, [phone]  Chief Complaint: Abdominal pain  HPI: Valerie [name] is a 54 y.o. female with medical history significant of hepatitis C with cirrhosis and metastatic hepatocellular carcinoma; bipolar d/o; pseudoseizures; and HTN presenting with abdominal pain. She reports that she was having epigastric pain and diffuse abdominal pain.  She really thought she was having a heart attack.  She was given her regular pain medication (Dilaudid) and Zofran and she felt a bit better.  She feels much better since they drained off the fluid.  They have not discussed placing a drain for the fluid.  She feels well now and prefers to go home.    She reports that she was diagnosed in March at [facility] with hepatocellular carcinoma.  She has been doing chemo infusions and continues with this.  She was due for repeat imaging soon.  She is due for one more treatment.  She thinks that this is a curable cancer.    ED Course:  Metastatic carcinoma.  Has portal vein thrombosis.  Dr. Benay Spice does not recommend anticoagulation.  Hypokalemia.  Likely needs paracentesis - had 4L off yesterday but reaccumulated. Off balance for a month.   Needs palliative care consult.  Call GI for recs too?  Review of Systems: As per HPI; otherwise review of systems reviewed and negative.   Ambulatory Status: Ambulates without assistance - she is certain she needs one now because she stumbles or falls alot  COVID Vaccine Status:  None - she had COVID and has not been told that she can have it yet  Past Medical History:  Diagnosis Date  . Bipolar disorder ([place])   . Hepatitis C   . Hepatocellular carcinoma ([place])   . Liver cirrhosis ([place])   . Pseudoseizures     Past Surgical History:  Procedure Laterality Date   . CESAREAN SECTION      Social History   Socioeconomic History  . Marital status: Unknown    Spouse name: Not on file  . Number of children: 6  . Years of education: Not on file  . Highest education level: Not on file  Occupational History  . Not on file  Tobacco Use  . Smoking status: Former Smoker    Packs/day: 1.00    Years: 20.00    Pack years: 20.00    Types: Cigarettes    Quit date: 06/2019    Years since quitting: 0.4  . Smokeless tobacco: Never Used  Substance and Sexual Activity  . 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 Drug use: Yes    Types: Marijuana, "Crack" cocaine    Comment: remote use of crack; daily use of marijuana  . Sexual activity: Not on file  Other Topics Concern  . Not on file  Social History Narrative  . Not on file   Social Determinants of Health   Financial Resource Strain:   . Difficulty of Paying Living Expenses: Not on file  Food Insecurity:   . Worried About Charity fundraiser in the Last Year: Not on file  . Ran Out of Food in the Last Year: Not on file  Transportation Needs:   . Lack of Transportation (Medical): Not on file  . Lack of Transportation (Non-Medical): Not on  file  Physical Activity:   . Days of Exercise per Week: Not on file  . Minutes of Exercise per Session: Not on file  Stress:   . Feeling of Stress : Not on file  Social Connections:   . Frequency of Communication with Friends and Family: Not on file  . Frequency of Social Gatherings with Friends and Family: Not on file  . Attends Religious Services: Not on file  . Active Member of Clubs or Organizations: Not on file  . Attends Archivist Meetings: Not on file  . Marital Status: Not on file  Intimate Partner Violence:   . Fear of Current or Ex-Partner: Not on file  . Emotionally Abused: Not on file  . Physically Abused: Not on file  . Sexually Abused: Not on file    Allergies  Allergen Reactions  . Norco  [Hydrocodone-Acetaminophen] Anaphylaxis and Shortness Of Breath  . Tylenol [Acetaminophen] Other (See Comments)    Cannot take due to liver    Family History  Family history unknown: Yes    Prior to Admission medications   Medication Sig Start Date End Date Taking? Authorizing Provider  albuterol (VENTOLIN HFA) 108 (90 Base) MCG/ACT inhaler Inhale 1 puff into the lungs every 4 (four) hours as needed for wheezing or shortness of breath. 08/31/19  Yes Arrien, Jimmy Picket, MD  FLUoxetine (PROZAC) 40 MG capsule Take 1 capsule (40 mg total) by mouth daily. 09/24/19 12/06/19 Yes Charlott Rakes, MD  hydrochlorothiazide (HYDRODIURIL) 25 MG tablet Take 1 tablet (25 mg total) by mouth daily. 09/24/19 12/06/19 Yes Charlott Rakes, MD  HYDROmorphone (DILAUDID) 2 MG tablet Take 1 tablet (2 mg total) by mouth every 4 (four) hours as needed for severe pain. 11/01/19  Yes Ladell Pier, MD  loperamide (IMODIUM A-D) 2 MG tablet Take 2-4 mg by mouth 4 (four) times daily as needed.   Yes [provider]  Nutritional Supplements (ENSURE COMPLETE PO) Take 1 Can by mouth 3 (three) times daily.   Yes [provider]  ondansetron (ZOFRAN) 8 MG tablet Take 1 tablet (8 mg total) by mouth every 8 (eight) hours as needed for nausea or vomiting. 10/31/19  Yes Ladell Pier, MD  polyethylene glycol (MIRALAX / GLYCOLAX) 17 g packet Take 17 g by mouth daily as needed for mild constipation.   Yes [provider]  potassium chloride SA (KLOR-CON) 20 MEQ tablet Take 1 tablet (20 mEq total) by mouth daily. 10/23/19  Yes Owens Shark, NP  sennosides-docusate sodium (SENOKOT-S) 8.6-50 MG tablet Take 1-2 tablets by mouth 2 (two) times daily as needed. 08/02/19  Yes [provider]  pantoprazole (PROTONIX) 40 MG tablet Take 1 tablet (40 mg total) by mouth daily. 09/24/19 10/24/19  Charlott Rakes, MD    Physical Exam: Vitals:   12/06/19 1500 12/06/19 1530 12/06/19 1700 12/06/19 1730  BP:  115/81 105/72 105/70 105/72  Pulse: 80 79  75  Resp: 12 18 11 15   Temp:      TempSrc:      SpO2: 100% 99%  96%     . General:  Appears calm and comfortable and is NAD . Eyes:  PERRL, EOMI, normal lids, iris . ENT:  grossly normal hearing, lips & tongue, mmm . Neck:  no LAD, masses or thyromegaly . Cardiovascular:  RRR, no m/r/g. No LE edema.  Marland Kitchen Respiratory:   CTA bilaterally with no wheezes/rales/rhonchi.  Normal respiratory effort. . Abdomen:  soft, NT, ND, NABS . Skin:  no rash or induration seen on limited exam . Musculoskeletal:  grossly normal tone BUE/BLE, good ROM, no bony abnormality . Psychiatric:  grossly normal mood and affect, speech fluent and appropriate, AOx3 . Neurologic:  CN 2-12 grossly intact, moves all extremities in coordinated fashion    Radiological Exams on Admission: DG Chest 2 View  Result Date: 12/06/2019 CLINICAL DATA:  Shortness of breath EXAM: CHEST - 2 VIEW COMPARISON:  09/06/2019 FINDINGS: Decreased now mild elevation of the right hemidiaphragm. Trace right pleural effusion. Small area of increased density at the right lung base. Lung nodules on prior CT are not visualized. No pneumothorax. Normal heart size. IMPRESSION: Decreased elevation of the right hemidiaphragm. Persistent small area of increased density at the right lung base, which may related to previously seen adjacent liver mass. Trace right pleural effusion. Electronically Signed   By: Macy Mis M.D.   On: 12/06/2019 10:23   CT Head Wo Contrast  Result Date: 12/06/2019 CLINICAL DATA:  [age]een see a, nontraumatic, history lung cancer, 1 day post paracentesis, LEFT upper chest pain and diffuse abdominal pain with nausea and vomiting EXAM: CT HEAD WITHOUT CONTRAST TECHNIQUE: Contiguous axial images were obtained from the base of the skull through the vertex without intravenous contrast. Sagittal and coronal MPR images reconstructed from axial data set. COMPARISON:  06/29/2019 FINDINGS:  Brain: Normal ventricular morphology. No midline shift or mass effect. Normal appearance of brain parenchyma. No intracranial hemorrhage, mass lesion, evidence of acute infarction, or extra-axial fluid collection. Vascular: Atherosclerotic calcification of internal carotid arteries at skull base. No hyperdense vessels. Skull: Intact Sinuses/Orbits: Clear Other: N/A IMPRESSION: No acute intracranial abnormalities. Electronically Signed   By: Lavonia Dana M.D.   On: 12/06/2019 12:54   CT Angio Chest PE W and/or Wo Contrast  Result Date: 12/06/2019 CLINICAL DATA:  PE suspected, history of hepatocellular carcinoma, lung metastases, left chest pain EXAM: CT ANGIOGRAPHY CHEST WITH CONTRAST TECHNIQUE: Multidetector CT imaging of the chest was performed using the standard protocol during bolus administration of intravenous contrast. Multiplanar CT image reconstructions and MIPs were obtained to evaluate the vascular anatomy. CONTRAST:  9mL OMNIPAQUE IOHEXOL 350 MG/ML SOLN COMPARISON:  CT chest, 06/30/2019 FINDINGS: Cardiovascular: Satisfactory opacification of the pulmonary arteries to the segmental level. No evidence of pulmonary embolism. Normal heart size. No pericardial effusion. Aortic atherosclerosis. Mediastinum/Nodes: No enlarged mediastinal, hilar, or axillary lymph nodes. Thyroid gland, trachea, and esophagus demonstrate no significant findings. Lungs/Pleura: Numerous bilateral pulmonary nodules are almost completely resolved in comparison to prior examination, with occasional small residual pulmonary nodules, for example a 3 mm nodule of the left lung base which previously measured 9 mm (series 11, image 61). Scarring and or atelectasis at the right lung base, similar to prior examination, with trace right pleural effusion. Upper Abdomen: Large volume ascites, cirrhosis, and splenomegaly in the included upper abdomen, separately reported. Musculoskeletal: No chest wall abnormality. No acute or significant  osseous findings. Review of the MIP images confirms the above findings. IMPRESSION: 1. Negative examination for pulmonary embolism. 2. Numerous bilateral pulmonary nodules are almost completely resolved in comparison to prior examination, with occasional small residual pulmonary nodules. Findings are consistent with treatment response of pulmonary metastatic disease. 3. Scarring and or atelectasis at the right lung base, similar to prior examination, with trace right pleural effusion. 4.  Aortic Atherosclerosis (ICD10-I70.0). 5. Large volume ascites, cirrhosis, and splenomegaly in the included upper abdomen, separately reported. Electronically Signed   By: Eddie Candle M.D.   On: 12/06/2019  13:03   CT ABDOMEN PELVIS W CONTRAST  Result Date: 12/06/2019 CLINICAL DATA:  Hepatocellular carcinoma, worsening abdominal pain, paracentesis yesterday EXAM: CT ABDOMEN AND PELVIS WITH CONTRAST TECHNIQUE: Multidetector CT imaging of the abdomen and pelvis was performed using the standard protocol following bolus administration of intravenous contrast. CONTRAST:  92mL OMNIPAQUE IOHEXOL 350 MG/ML SOLN COMPARISON:  08/27/2019 FINDINGS: Lower chest: No acute abnormality, separately reported. Hepatobiliary: Redemonstrated cirrhotic morphology of the liver, with a large, ill-defined hypodense mass of the posterior right lobe of the liver, involving hepatic segments VI and VII. Although difficult to accurately measure given ill definition and single phase of contrast on this examination, this is not significantly changed compared to prior examination, measuring approximately 8.4 x 6.1 cm (series 4, image 18). There is new thrombus which appears to occlude the right portal vein and extends into the proximal left portal vein (series 4, image 29). No gallstones, gallbladder wall thickening, or biliary dilatation. Pancreas: Unremarkable. No pancreatic ductal dilatation or surrounding inflammatory changes. Spleen: Mild splenomegaly,  maximum span 13.1 cm. Adrenals/Urinary Tract: Adrenal glands are unremarkable. Kidneys are normal, without renal calculi, solid lesion, or hydronephrosis. Bladder is unremarkable. Stomach/Bowel: Stomach is within normal limits. Appendix appears normal. No evidence of bowel wall thickening, distention, or inflammatory changes. Vascular/Lymphatic: Aortic atherosclerosis. No enlarged abdominal or pelvic lymph nodes. Reproductive: No mass or other significant abnormality. Other: No abdominal wall hernia or abnormality. Large volume ascites, similar to prior examination. Musculoskeletal: No acute or significant osseous findings. IMPRESSION: 1. Redemonstrated cirrhotic morphology of the liver. 2. Large, ill-defined hypodense mass of the posterior right lobe of the liver, involving hepatic segments VI and VII. Although difficult to accurately measure given ill definition and single phase of contrast on this examination, this is not significantly changed compared to prior examination and in keeping with known diagnosis of hepatocellular carcinoma. 3. There is new thrombus which appears to occlude the right portal vein and extends into the proximal left portal vein, very likely tumor thrombus in the setting of hepatocellular carcinoma. 4. Large volume ascites, similar to prior examination. 5. Mild splenomegaly. 6. Aortic Atherosclerosis (ICD10-I70.0). Electronically Signed   By: Eddie Candle M.D.   On: 12/06/2019 13:12   US Paracentesis  Result Date: 12/05/2019 INDICATION: Patient with history of hepatitis-C, hepatocellular carcinoma, recurrent ascites, previous COVID-19 in May of this year. Request received for diagnostic and therapeutic paracentesis up to 4 liters. EXAM: ULTRASOUND GUIDED DIAGNOSTIC AND THERAPEUTIC PARACENTESIS MEDICATIONS: 1% lidocaine to skin and subcutaneous tissue COMPLICATIONS: None immediate. PROCEDURE: Informed written consent was obtained from the patient after a discussion of the risks,  benefits and alternatives to treatment. A timeout was performed prior to the initiation of the procedure. Initial ultrasound scanning demonstrates a moderate to large amount of ascites within the left lower abdominal quadrant. The left lower abdomen was prepped and draped in the usual sterile fashion. 1% lidocaine was used for local anesthesia. Following this, a 19 gauge, 7-cm, Yueh catheter was introduced. An ultrasound image was saved for documentation purposes. The paracentesis was performed. The catheter was removed and a dressing was applied. The patient tolerated the procedure well without immediate post procedural complication. FINDINGS: A total of approximately 4 liters of amber fluid was removed. Samples were sent to the laboratory as requested by the clinical team. IMPRESSION: Successful ultrasound-guided diagnostic and therapeutic paracentesis yielding 4 liters of peritoneal fluid. Read by: Rowe Robert, PA-C Electronically Signed   By: Jerilynn Mages.  Shick M.D.   On: 12/05/2019 12:18  EKG: Independently reviewed.  NSR with rate 91; prolonged QTc 525; nonspecific ST changes with no evidence of acute ischemia   Labs on Admission: I have personally reviewed the available labs and imaging studies at the time of the admission.  Pertinent labs:   K+ 2.6 BUN 16/Creatinine 1.37/GFR 44 Anion gap 17 Albumin 2.5 Lipase 69 AST 103/ALT 28/Bili 2.8 HS troponin 2, 3 Mag++ 2.2 WBC 3.3 Platelets 45 INR 1.6   Assessment/Plan Principal Problem:   Hepatocellular carcinoma (HCC) Active Problems:   Thrombocytopenia ([place])   Liver cirrhosis (HCC)   Abdominal pain    -Patient presenting with abdominal pain after just having had paracentesis of 4L yesterday -Recurrent ascites, drained by IR prior to my evaluation of the patient -At the time of my evaluation, she reported significant improvement in symptoms with desire for d/c to home -Given improvement in symptoms, this seems reasonable -She did have  marked hypokalemia which has been repleted; needs outpatient f/u -She also does not have a clear understanding of the severity of her condition and would likely benefit from palliative care consultation -Needs close outpatient oncology f/u      Thank you for this interesting consult.  The patient appears stable for d/c to home with close outpatient oncology f/u.  Would strongly suggest consideration of palliative care consultation.   Karmen Bongo MD [facility]   How to contact the [facility] Attending or Consulting provider [place] or covering provider during after hours [place], for this patient?  1. Check the care team in [facility] and look for a) attending/consulting [facility] provider listed and b) the [facility] team listed 2. Log into [email or web address] and use [place]'s universal password to access. If you do not have the password, please contact the hospital operator. 3. Locate the [facility] provider you are looking for under [facility] and page to a number that you can be directly reached. 4. If you still have difficulty reaching the provider, please page the [facility] (Director on Call) for the Hospitalists listed on amion for assistance.   12/06/2019, 7:01 PM

## 2019-12-06 NOTE — ED Notes (Signed)
Pt is still in IR.

## 2019-12-06 NOTE — ED Provider Notes (Signed)
Pt was seen by Dr. Lorin Mercy and there was a plan for admission.  However, pt felt so much better after IR drained her ascites that she wants to go home.  Dr. Lorin Mercy offered admission, but she wants to go home.  I talked to her as well and she would be more comfortable at home and also told me that she wanted to go home.  She is instructed to f/u with Dr. Benay Spice.  Return if worse.   Isla Pence, MD 12/06/19 1736

## 2019-12-06 NOTE — Procedures (Signed)
PROCEDURE SUMMARY:  Successful US guided paracentesis from right lateral abdomen.  Yielded 3.0 liters of yellow fluid.  No immediate complications.  Pt tolerated well.   Specimen was not sent for labs.  EBL < 30mL  Docia Barrier PA-C 12/06/2019 4:19 PM

## 2019-12-08 ENCOUNTER — Emergency Department (HOSPITAL_COMMUNITY)
Admission: EM | Admit: 2019-12-08 | Discharge: 2019-12-09 | Disposition: A | Discharge status: Home / Self Care | Payer: Medicaid Other | Attending: Emergency Medicine | Admitting: Emergency Medicine

## 2019-12-08 ENCOUNTER — Encounter (HOSPITAL_COMMUNITY): Payer: Self-pay

## 2019-12-08 ENCOUNTER — Other Ambulatory Visit: Payer: Self-pay

## 2019-12-08 DIAGNOSIS — Z5321 Procedure and treatment not carried out due to patient leaving prior to being seen by health care provider: Secondary | ICD-10-CM | POA: Diagnosis not present

## 2019-12-08 DIAGNOSIS — Z8505 Personal history of malignant neoplasm of liver: Secondary | ICD-10-CM | POA: Insufficient documentation

## 2019-12-08 DIAGNOSIS — Z87738 Personal history of other specified (corrected) congenital malformations of digestive system: Secondary | ICD-10-CM | POA: Insufficient documentation

## 2019-12-08 DIAGNOSIS — R1032 Left lower quadrant pain: Secondary | ICD-10-CM | POA: Insufficient documentation

## 2019-12-08 NOTE — ED Triage Notes (Signed)
Per ems: pt coming in c/o LLQ abdominal pain. Hx liver cancer and cirrhosis. Treated [facility] Friday. No n/v/d or fevers

## 2019-12-09 ENCOUNTER — Other Ambulatory Visit: Payer: Self-pay | Admitting: Oncology

## 2019-12-09 ENCOUNTER — Telehealth: Payer: Self-pay

## 2019-12-09 ENCOUNTER — Other Ambulatory Visit: Payer: Self-pay | Admitting: Family Medicine

## 2019-12-09 DIAGNOSIS — C220 Liver cell carcinoma: Secondary | ICD-10-CM

## 2019-12-09 DIAGNOSIS — F411 Generalized anxiety disorder: Secondary | ICD-10-CM

## 2019-12-09 DIAGNOSIS — R1013 Epigastric pain: Secondary | ICD-10-CM

## 2019-12-09 LAB — CBC
HCT: 35.4 % — ABNORMAL LOW (ref 36.0–46.0)
Hemoglobin: 11.9 g/dL — ABNORMAL LOW (ref 12.0–15.0)
MCH: 32.7 pg (ref 26.0–34.0)
MCHC: 33.6 g/dL (ref 30.0–36.0)
MCV: 97.3 fL (ref 80.0–100.0)
Platelets: 53 10*3/uL — ABNORMAL LOW (ref 150–400)
RBC: 3.64 MIL/uL — ABNORMAL LOW (ref 3.87–5.11)
RDW: 15.7 % — ABNORMAL HIGH (ref 11.5–15.5)
WBC: 4.2 10*3/uL (ref 4.0–10.5)
nRBC: 0 % (ref 0.0–0.2)

## 2019-12-09 LAB — COMPREHENSIVE METABOLIC PANEL
ALT: 33 U/L (ref 0–44)
AST: 97 U/L — ABNORMAL HIGH (ref 15–41)
Albumin: 2.5 g/dL — ABNORMAL LOW (ref 3.5–5.0)
Alkaline Phosphatase: 82 U/L (ref 38–126)
Anion gap: 6 (ref 5–15)
BUN: 18 mg/dL (ref 6–20)
CO2: 27 mmol/L (ref 22–32)
Calcium: 8 mg/dL — ABNORMAL LOW (ref 8.9–10.3)
Chloride: 104 mmol/L (ref 98–111)
Creatinine, Ser: 1.37 mg/dL — ABNORMAL HIGH (ref 0.44–1.00)
GFR calc Af Amer: 51 mL/min — ABNORMAL LOW (ref >60)
GFR calc non Af Amer: 44 mL/min — ABNORMAL LOW (ref >60)
Glucose, Bld: 98 mg/dL (ref 70–99)
Potassium: 3.8 mmol/L (ref 3.5–5.1)
Sodium: 137 mmol/L (ref 135–145)
Total Bilirubin: 1.8 mg/dL — ABNORMAL HIGH (ref 0.3–1.2)
Total Protein: 5.8 g/dL — ABNORMAL LOW (ref 6.5–8.1)

## 2019-12-09 LAB — I-STAT BETA HCG BLOOD, ED (MC, WL, AP ONLY): I-stat hCG, quantitative: <5 m[IU]/mL (ref <5)

## 2019-12-09 LAB — LIPASE, BLOOD: Lipase: 62 U/L — ABNORMAL HIGH (ref 11–51)

## 2019-12-09 NOTE — Telephone Encounter (Signed)
"  Valerie [name]" called to discuss pt stating that she was on "the list" of approved contacts. This RN informed her that we did not have her name listed as an approved contact at this time and she would need to reach out to the patient directly for information. She verbalizes understanding

## 2019-12-10 ENCOUNTER — Inpatient Hospital Stay: Payer: Medicaid Other

## 2019-12-10 ENCOUNTER — Inpatient Hospital Stay (HOSPITAL_BASED_OUTPATIENT_CLINIC_OR_DEPARTMENT_OTHER): Payer: Medicaid Other | Admitting: Oncology

## 2019-12-10 ENCOUNTER — Other Ambulatory Visit: Payer: Self-pay

## 2019-12-10 ENCOUNTER — Other Ambulatory Visit: Payer: Self-pay | Admitting: *Deleted

## 2019-12-10 VITALS — BP 140/85 | HR 100 | Temp 97.2°F | Resp 16 | Ht 62.0 in | Wt 114.0 lb

## 2019-12-10 DIAGNOSIS — C220 Liver cell carcinoma: Secondary | ICD-10-CM

## 2019-12-10 DIAGNOSIS — Z5112 Encounter for antineoplastic immunotherapy: Secondary | ICD-10-CM | POA: Diagnosis not present

## 2019-12-10 LAB — BASIC METABOLIC PANEL - CANCER CENTER ONLY
Anion gap: 7 (ref 5–15)
BUN: 20 mg/dL (ref 6–20)
CO2: 27 mmol/L (ref 22–32)
Calcium: 8.9 mg/dL (ref 8.9–10.3)
Chloride: 102 mmol/L (ref 98–111)
Creatinine: 1.37 mg/dL — ABNORMAL HIGH (ref 0.44–1.00)
GFR, Est AFR Am: 51 mL/min — ABNORMAL LOW (ref >60)
GFR, Estimated: 44 mL/min — ABNORMAL LOW (ref >60)
Glucose, Bld: 99 mg/dL (ref 70–99)
Potassium: 3.8 mmol/L (ref 3.5–5.1)
Sodium: 136 mmol/L (ref 135–145)

## 2019-12-10 MED ORDER — ONDANSETRON HCL 8 MG PO TABS
8.0000 mg | ORAL_TABLET | Freq: Three times a day (TID) | ORAL | 2 refills | Status: AC | PRN
Start: 2019-12-10 — End: ?

## 2019-12-10 MED ORDER — POTASSIUM CHLORIDE CRYS ER 20 MEQ PO TBCR: 20.0000 meq | EXTENDED_RELEASE_TABLET | Freq: Every day | ORAL | 2 refills | Status: AC

## 2019-12-10 MED ORDER — SPIRONOLACTONE 50 MG PO TABS: 50.0000 mg | ORAL_TABLET | Freq: Every day | ORAL | 2 refills | Status: AC

## 2019-12-10 MED ORDER — HYDROMORPHONE HCL 2 MG PO TABS: 2.0000 mg | ORAL_TABLET | ORAL | 0 refills | Status: DC | PRN

## 2019-12-10 NOTE — Progress Notes (Signed)
[facility] OFFICE PROGRESS NOTE   Diagnosis: Hepatocellular carcinoma, cirrhosis  INTERVAL HISTORY:   Ms. Keach underwent a therapeutic paracentesis for 4 L on 12/05/2019.  She presented to the emergency room on 12/06/2019 with abdominal pain.  She underwent another paracentesis for 3 L.  She reports partial improvement in abdominal pain following the paracentesis procedures.  The pain has returned.  Abdominal distention has returned.  A CT of the abdomen and pelvis on 12/06/2019 revealed cirrhosis, and unchanged posterior right liver mass, new right portal vein thrombosis, large volume ascites, and mild splenomegaly.  I reviewed the images with the radiologist on 12/06/2019.  He indicated the "portal vein thrombus "was very likely tumor thrombus and varices are present. A CT of the chest on 12/06/2019 was negative for pulmonary embolism and there is near complete resolution of pulmonary nodules.  A CT of the head revealed no acute change.  Ms. Kinsel reports a history of seizures that have recurred recently, last on 12/07/2019.  She is not taking seizure medication.   Objective:  Vital signs in last 24 hours:  Blood pressure (!) 139/102, pulse 100, temperature (!) 97.2 F (36.2 C), temperature source Tympanic, resp. rate 16, height 5\' 2"  (1.575 m), weight 114 lb (51.7 kg), SpO2 100 %.    Resp: Lungs clear bilaterally Cardio: Regular rate and rhythm GI: Distended with ascites, no mass, mild diffuse tenderness Vascular: No leg edema Neuro: Alert and oriented Skin: No rash   Lab Results:  Lab Results  Component Value Date   WBC 4.2 12/09/2019   HGB 11.9 (L) 12/09/2019   HCT 35.4 (L) 12/09/2019   MCV 97.3 12/09/2019   PLT 53 (L) 12/09/2019   NEUTROABS 2.2 12/06/2019    CMP  Lab Results  Component Value Date   NA 136 12/10/2019   K 3.8 12/10/2019   CL 102 12/10/2019   CO2 27 12/10/2019   GLUCOSE 99 12/10/2019   BUN 20 12/10/2019   CREATININE 1.37 (H)  12/10/2019   CALCIUM 8.9 12/10/2019   PROT 5.8 (L) 12/09/2019   ALBUMIN 2.5 (L) 12/09/2019   AST 97 (H) 12/09/2019   ALT 33 12/09/2019   ALKPHOS 82 12/09/2019   BILITOT 1.8 (H) 12/09/2019   GFRNONAA 44 (L) 12/10/2019   GFRAA 51 (L) 12/10/2019     Imaging:  CT Head Wo Contrast  Result Date: 12/06/2019 CLINICAL DATA:  [age] see a, nontraumatic, history lung cancer, 1 day post paracentesis, LEFT upper chest pain and diffuse abdominal pain with nausea and vomiting EXAM: CT HEAD WITHOUT CONTRAST TECHNIQUE: Contiguous axial images were obtained from the base of the skull through the vertex without intravenous contrast. Sagittal and coronal MPR images reconstructed from axial data set. COMPARISON:  06/29/2019 FINDINGS: Brain: Normal ventricular morphology. No midline shift or mass effect. Normal appearance of brain parenchyma. No intracranial hemorrhage, mass lesion, evidence of acute infarction, or extra-axial fluid collection. Vascular: Atherosclerotic calcification of internal carotid arteries at skull base. No hyperdense vessels. Skull: Intact Sinuses/Orbits: Clear Other: N/A IMPRESSION: No acute intracranial abnormalities. Electronically Signed   By: Lavonia Dana M.D.   On: 12/06/2019 12:54   CT Angio Chest PE W and/or Wo Contrast  Result Date: 12/06/2019 CLINICAL DATA:  PE suspected, history of hepatocellular carcinoma, lung metastases, left chest pain EXAM: CT ANGIOGRAPHY CHEST WITH CONTRAST TECHNIQUE: Multidetector CT imaging of the chest was performed using the standard protocol during bolus administration of intravenous contrast. Multiplanar CT image reconstructions and MIPs were obtained  to evaluate the vascular anatomy. CONTRAST:  63mL OMNIPAQUE IOHEXOL 350 MG/ML SOLN COMPARISON:  CT chest, 06/30/2019 FINDINGS: Cardiovascular: Satisfactory opacification of the pulmonary arteries to the segmental level. No evidence of pulmonary embolism. Normal heart size. No pericardial effusion. Aortic  atherosclerosis. Mediastinum/Nodes: No enlarged mediastinal, hilar, or axillary lymph nodes. Thyroid gland, trachea, and esophagus demonstrate no significant findings. Lungs/Pleura: Numerous bilateral pulmonary nodules are almost completely resolved in comparison to prior examination, with occasional small residual pulmonary nodules, for example a 3 mm nodule of the left lung base which previously measured 9 mm (series 11, image 61). Scarring and or atelectasis at the right lung base, similar to prior examination, with trace right pleural effusion. Upper Abdomen: Large volume ascites, cirrhosis, and splenomegaly in the included upper abdomen, separately reported. Musculoskeletal: No chest wall abnormality. No acute or significant osseous findings. Review of the MIP images confirms the above findings. IMPRESSION: 1. Negative examination for pulmonary embolism. 2. Numerous bilateral pulmonary nodules are almost completely resolved in comparison to prior examination, with occasional small residual pulmonary nodules. Findings are consistent with treatment response of pulmonary metastatic disease. 3. Scarring and or atelectasis at the right lung base, similar to prior examination, with trace right pleural effusion. 4.  Aortic Atherosclerosis (ICD10-I70.0). 5. Large volume ascites, cirrhosis, and splenomegaly in the included upper abdomen, separately reported. Electronically Signed   By: Eddie Candle M.D.   On: 12/06/2019 13:03   CT ABDOMEN PELVIS W CONTRAST  Result Date: 12/06/2019 CLINICAL DATA:  Hepatocellular carcinoma, worsening abdominal pain, paracentesis yesterday EXAM: CT ABDOMEN AND PELVIS WITH CONTRAST TECHNIQUE: Multidetector CT imaging of the abdomen and pelvis was performed using the standard protocol following bolus administration of intravenous contrast. CONTRAST:  21mL OMNIPAQUE IOHEXOL 350 MG/ML SOLN COMPARISON:  08/27/2019 FINDINGS: Lower chest: No acute abnormality, separately reported.  Hepatobiliary: Redemonstrated cirrhotic morphology of the liver, with a large, ill-defined hypodense mass of the posterior right lobe of the liver, involving hepatic segments VI and VII. Although difficult to accurately measure given ill definition and single phase of contrast on this examination, this is not significantly changed compared to prior examination, measuring approximately 8.4 x 6.1 cm (series 4, image 18). There is new thrombus which appears to occlude the right portal vein and extends into the proximal left portal vein (series 4, image 29). No gallstones, gallbladder wall thickening, or biliary dilatation. Pancreas: Unremarkable. No pancreatic ductal dilatation or surrounding inflammatory changes. Spleen: Mild splenomegaly, maximum span 13.1 cm. Adrenals/Urinary Tract: Adrenal glands are unremarkable. Kidneys are normal, without renal calculi, solid lesion, or hydronephrosis. Bladder is unremarkable. Stomach/Bowel: Stomach is within normal limits. Appendix appears normal. No evidence of bowel wall thickening, distention, or inflammatory changes. Vascular/Lymphatic: Aortic atherosclerosis. No enlarged abdominal or pelvic lymph nodes. Reproductive: No mass or other significant abnormality. Other: No abdominal wall hernia or abnormality. Large volume ascites, similar to prior examination. Musculoskeletal: No acute or significant osseous findings. IMPRESSION: 1. Redemonstrated cirrhotic morphology of the liver. 2. Large, ill-defined hypodense mass of the posterior right lobe of the liver, involving hepatic segments VI and VII. Although difficult to accurately measure given ill definition and single phase of contrast on this examination, this is not significantly changed compared to prior examination and in keeping with known diagnosis of hepatocellular carcinoma. 3. There is new thrombus which appears to occlude the right portal vein and extends into the proximal left portal vein, very likely tumor  thrombus in the setting of hepatocellular carcinoma. 4. Large volume ascites, similar to prior  examination. 5. Mild splenomegaly. 6. Aortic Atherosclerosis (ICD10-I70.0). Electronically Signed   By: Eddie Candle M.D.   On: 12/06/2019 13:12   IR Paracentesis  Result Date: 12/09/2019 INDICATION: Patient with history of [place], recurrent ascites. Request is made for therapeutic paracentesis of up to 4 L maximum. EXAM: ULTRASOUND GUIDED THERAPEUTIC PARACENTESIS MEDICATIONS: 10 mL 1% lidocaine COMPLICATIONS: None immediate. PROCEDURE: Informed written consent was obtained from the patient after a discussion of the risks, benefits and alternatives to treatment. A timeout was performed prior to the initiation of the procedure. Initial ultrasound scanning demonstrates a moderate amount of ascites within the right lower abdominal quadrant. The right lower abdomen was prepped and draped in the usual sterile fashion. 1% lidocaine was used for local anesthesia. Following this, a 19 gauge, 7-cm, Yueh catheter was introduced. An ultrasound image was saved for documentation purposes. The paracentesis was performed. The catheter was removed and a dressing was applied. The patient tolerated the procedure well without immediate post procedural complication. FINDINGS: A total of approximately 3.0 liters of yellow fluid was removed. Samples were sent to the laboratory as requested by the clinical team. IMPRESSION: Successful ultrasound-guided therapeutic paracentesis yielding 3.0 liters of peritoneal fluid. Read by: Brynda Greathouse PA-C Electronically Signed   By: Jerilynn Mages.  Shick M.D.   On: 12/06/2019 16:23    Medications: I have reviewed the patient's current medications.   Assessment/Plan: 1.Liver and lung lesions concerning for metastatic cancer, ?[place] primary -CT 06/28/2019-probable cirrhosis, large heterogenous mass in the right liver extending to the right lung base with possible violation of the capsule and invasion of the  right hemidiaphragm, at least 1 additional mass in the inferior right liver -06/29/2019 MRI of the abdomen-probable diffusely infiltrative hepatocellular carcinoma throughout the right lobe of the liver which appears to extend out of the hepatic capsule and invade the right hemidiaphragm, tumor thrombus involving the right portal and hepatic veins, esophageal and proximal gastric varices -06/30/2019 CT of the chest without contrast-multiple pulmonary nodules concerning for widespread metastatic disease to the lungs, large right lobe liver mass with transdiaphragmatic invasion into the lower right hemithorax involving portions of the right lower lobe -07/01/2019 -[id number] -Cycle 1atezolizumab/bevacizumab 07/12/2019 -Cycle 2 atezolizumab/bevacizumab 08/02/2019 -Cycle 3 atezolizumab/bevacizumab 08/22/2019 -CT 08/27/2019-new ascites, decreased right liver mass with diminished diaphragmatic involvement, improved pulmonary nodules -Cycle 4 atezolizumab/bevacizumab 09/19/2019 -Cycle 5 atezolizumab/bevacizumab 10/23/2019 -Cycle 6 atezolizumab/bevacizumab 11/12/2019 -CTs 12/06/2019-head without acute abnormality, near complete resolution of lung nodules, stable right liver mass, new right portal vein thrombosis, changes of cirrhosis, mild splenomegaly, varices 2. Hepatitis C -Reports prior treatment with 16 weeks of interferon ribavirin -06/29/2019 HCV RNA quantitative 634,000 3. Cirrhosis 4. Pseudoseizures 5. Bipolar disorder 6. Anemia and thrombocytopenia 7.  History ofelevated LFTs 8.Admission with COVID-19 pneumonia 08/27/2019, treated with steroids and remdesivir 9.  Abdominal pain    Disposition: Ms. Rys has metastatic hepatocellular carcinoma.  She has increased abdominal pain and ascites.  The CTs 12/06/2019 revealed no evidence of progressive HCC in the liver or lungs.  There is new right portal vein thrombosis.  I suspect the thrombosis is related to "tumor thrombus ".  It is unclear  whether this is contributing to the increased ascites and pain.  She is a poor candidate for anticoagulation therapy in the setting of cirrhosis, thrombocytopenia, and an elevated PT/INR.  She also had small varices on the CT last week.  She has rapidly reaccumulating ascites.  She will continue HCTZ.  I added spironolactone.  We will make referral to gastroenterology  for management of cirrhosis/ascites and to further discuss the indication for anticoagulation therapy.  She would like to discuss treatment of hepatitis C with the gastroenterologist.  Ms. Westerhoff will benefit from a home health RN to help with pain and blood pressure management, and to assess for home equipment needs.  She reports recent seizures.  She has a history of "pseudoseizures ".  I made a neurology referral today.  She will return for an office visit in 1 week.  We will arrange for a repeat paracentesis this week to include a sample for cytology.  Betsy Coder, MD  12/10/2019  10:30 AM

## 2019-12-11 ENCOUNTER — Telehealth: Payer: Self-pay | Admitting: Oncology

## 2019-12-11 ENCOUNTER — Ambulatory Visit (INDEPENDENT_AMBULATORY_CARE_PROVIDER_SITE_OTHER): Payer: Medicaid Other | Admitting: Neurology

## 2019-12-11 ENCOUNTER — Encounter: Payer: Self-pay | Admitting: Neurology

## 2019-12-11 VITALS — BP 129/87 | HR 83 | Ht 62.0 in | Wt 114.0 lb

## 2019-12-11 DIAGNOSIS — R404 Transient alteration of awareness: Secondary | ICD-10-CM | POA: Diagnosis not present

## 2019-12-11 NOTE — Patient Instructions (Signed)
1. Schedule MRI brain with and without contrast  2. Schedule 1-hour EEG  3. If tests are normal, we will plan to do a longer EEG.  4. Follow-up after tests, call for any changes   Seizure Precautions: 1. If medication has been prescribed for you to prevent seizures, take it exactly as directed.  Do not stop taking the medicine without talking to your doctor first, even if you have not had a seizure in a long time.   2. Avoid activities in which a seizure would cause danger to yourself or to others.  Don't operate dangerous machinery, swim alone, or climb in high or dangerous places, such as on ladders, roofs, or girders.  Do not drive unless your doctor says you may.  3. If you have any warning that you may have a seizure, lay down in a safe place where you can't hurt yourself.    4.  No driving for 6 months from last seizure, as per [facility].   Please refer to the following link on the [facility] website for more information: http://[email or web address]/[email or web address]/[email or web address]   5.  Maintain good sleep hygiene. Avoid alcohol.  6.  Contact your doctor if you have any problems that may be related to the medicine you are taking.  7.  Call 911 and bring the patient back to the ED if:        A.  The seizure lasts longer than 5 minutes.       B.  The patient doesn't awaken shortly after the seizure  C.  The patient has new problems such as difficulty seeing, speaking or moving  D.  The patient was injured during the seizure  E.  The patient has a temperature over 102 F (39C)  F.  The patient vomited and now is having trouble breathing

## 2019-12-11 NOTE — Progress Notes (Signed)
NEUROLOGY CONSULTATION NOTE  Valerie [name] MRN: [id number] DOB: Sep 14, 1965  Referring provider: Dr. Donneta [name] Primary care provider: Dr. Charlott [name]  Reason for consult:  seizure  Dear Dr Valerie [name]:  Thank you for your kind referral of Valerie [name] for consultation of the above symptoms. Although her history is well known to you, please allow me to reiterate it for the purpose of our medical record. The patient was accompanied to the clinic by her grandson who also provides collateral information. Records and images were personally reviewed where available.   HISTORY OF PRESENT ILLNESS: This is a 54 year old right-handed woman with a history of metastatic hepatocellular carcinoma with cirrhosis, portal vein thrombus, bipolar disorder, presenting for evaluation of seizures. She was at her oncology visit yesterday and reported last seizure was on 12/07/19. 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 She was previously on Depakote. There is an ER visit in 2018 at [facility] where she was found in the bathroom by co-workers, she only recalls feeling hot and flushed. She had run out of Depakote at that time. She has not been on Depakote for many years. She and her grandson continue to report recurrent episodes of staring and unresponsiveness. She reports they started at age 54 during her 6th pregnancy, and was told stress was causing her seizures. She had seen a neurologist in [place], [place] and recalls having an EEG, results unknown. She reports a metallic taste and a headache prior, then loses awareness. She knows she had one after because she would feel tired and have a headache. Her grandson reports episodes where she has a blank face, not responding, "not there." Sometimes her lip or leg would twitch. She may fall during these episodes and they have to catch her. He usually puts a cold rag on her neck. She only recalls one  time associated with urinary incontinence. Last staring spell was 2 days ago, her grandson thinks they occur a few times a week. No convulsive activity. She reports memory loss, sometimes she cannot remember anything. Both legs go weak and numb, she reports cramps in both legs. She has migraines 1-2 times a month with pain in the right periorbital region. She always feels sick. She gets dizzy standing up or after prolonged standing. No diplopia, dysarthria/dysphagia, neck/back pain, bowel/bladder dysfunction. She was adopted and does not know family history. She denies any history of febrile convulsions, CNS infections such as meningitis/encephalitis, significant traumatic brain injury, neurosurgical procedures.  Laboratory Data:  Lab Results  Component Value Date   WBC 4.2 12/09/2019   HGB 11.9 (L) 12/09/2019   HCT 35.4 (L) 12/09/2019   MCV 97.3 12/09/2019   PLT 53 (L) 12/09/2019     Chemistry      Component Value Date/Time   NA 136 12/10/2019 0932   K 3.8 12/10/2019 0932   CL 102 12/10/2019 0932   CO2 27 12/10/2019 0932   BUN 20 12/10/2019 0932   CREATININE 1.37 (H) 12/10/2019 0932      Component Value Date/Time   CALCIUM 8.9 12/10/2019 0932   ALKPHOS 82 12/09/2019 0007   AST 97 (H) 12/09/2019 0007   AST 112 (H) 12/03/2019 1138   ALT 33 12/09/2019 0007   ALT 29 12/03/2019 1138   BILITOT 1.8 (H) 12/09/2019 0007   BILITOT 2.8 (H) 12/03/2019 1138      Prior AEDs: Depakote  EEGs: none available for  review MRI: MRI brain without contrast in 2018 for transient right-sided weakness showed no acute changes, minimal chronic microvascular disease.   PAST MEDICAL HISTORY: Past Medical History:  Diagnosis Date  . Bipolar disorder ([place])   . Hepatitis C   . Hepatocellular carcinoma ([place])   . Liver cirrhosis ([place])   . Pseudoseizures     PAST SURGICAL HISTORY: Past Surgical History:  Procedure Laterality Date  . CESAREAN SECTION    . IR PARACENTESIS  12/06/2019     MEDICATIONS: Current Outpatient Medications on File Prior to Visit  Medication Sig Dispense Refill  . albuterol (VENTOLIN HFA) 108 (90 Base) MCG/ACT inhaler Inhale 1 puff into the lungs every 4 (four) hours as needed for wheezing or shortness of breath. 18 g 0  . FLUoxetine (PROZAC) 40 MG capsule Take 1 capsule (40 mg total) by mouth daily. 30 capsule 6  . hydrochlorothiazide (HYDRODIURIL) 25 MG tablet Take 1 tablet (25 mg total) by mouth daily. 30 tablet 6  . HYDROmorphone (DILAUDID) 2 MG tablet Take 1 tablet (2 mg total) by mouth every 4 (four) hours as needed for severe pain. 60 tablet 0  . loperamide (IMODIUM A-D) 2 MG tablet Take 2-4 mg by mouth 4 (four) times daily as needed. (Patient not taking: Reported on 12/10/2019)    . Nutritional Supplements (ENSURE COMPLETE PO) Take 1 Can by mouth 3 (three) times daily.    . ondansetron (ZOFRAN) 8 MG tablet Take 1 tablet (8 mg total) by mouth every 8 (eight) hours as needed for nausea or vomiting. 30 tablet 2  . pantoprazole (PROTONIX) 40 MG tablet Take 1 tablet (40 mg total) by mouth daily. 30 tablet 3  . polyethylene glycol (MIRALAX / GLYCOLAX) 17 g packet Take 17 g by mouth daily as needed for mild constipation.    . potassium chloride SA (KLOR-CON) 20 MEQ tablet Take 1 tablet (20 mEq total) by mouth daily. 30 tablet 2  . sennosides-docusate sodium (SENOKOT-S) 8.6-50 MG tablet Take 1-2 tablets by mouth 2 (two) times daily as needed.    Marland Kitchen spironolactone (ALDACTONE) 50 MG tablet Take 1 tablet (50 mg total) by mouth daily. 30 tablet 2   No current facility-administered medications on file prior to visit.    ALLERGIES: Allergies  Allergen Reactions  . Norco [Hydrocodone-Acetaminophen] Anaphylaxis and Shortness Of Breath  . Tylenol [Acetaminophen] Other (See Comments)    Cannot take due to liver    FAMILY HISTORY: Family History  Family history unknown: Yes    SOCIAL HISTORY: Social History   Socioeconomic History  . Marital  status: Unknown    Spouse name: Not on file  . Number of children: 6  . Years of education: Not on file  . Highest education level: Not on file  Occupational History  . Not on file  Tobacco Use  . Smoking status: Former Smoker    Packs/day: 1.00    Years: 20.00    Pack years: 20.00    Types: Cigarettes    Quit date: 06/2019    Years since quitting: 0.5  . Smokeless tobacco: Never Used  Substance and Sexual Activity  . 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 Drug use: Yes    Types: Marijuana, "Crack" cocaine    Comment: remote use of crack; daily use of marijuana  . Sexual activity: Not on file  Other Topics Concern  . Not on file  Social History Narrative  . Not on  file   Social Determinants of Health   Financial Resource Strain:   . Difficulty of Paying Living Expenses: Not on file  Food Insecurity:   . Worried About Charity fundraiser in the Last Year: Not on file  . Ran Out of Food in the Last Year: Not on file  Transportation Needs:   . Lack of Transportation (Medical): Not on file  . Lack of Transportation (Non-Medical): Not on file  Physical Activity:   . Days of Exercise per Week: Not on file  . Minutes of Exercise per Session: Not on file  Stress:   . Feeling of Stress : Not on file  Social Connections:   . Frequency of Communication with Friends and Family: Not on file  . Frequency of Social Gatherings with Friends and Family: Not on file  . Attends Religious Services: Not on file  . Active Member of Clubs or Organizations: Not on file  . Attends Archivist Meetings: Not on file  . Marital Status: Not on file  Intimate Partner Violence:   . Fear of Current or Ex-Partner: Not on file  . Emotionally Abused: Not on file  . Physically Abused: Not on file  . Sexually Abused: Not on file    PHYSICAL EXAM: Vitals:   12/11/19 1309  BP: 129/87  Pulse: 83  SpO2: 99%   General: No acute distress Head:   Normocephalic/atraumatic Skin/Extremities: No rash, no edema Neurological Exam: Mental status: alert and oriented to person, place, and time, no dysarthria or aphasia, Fund of knowledge is appropriate.  Recent and remote memory are intact.  Attention and concentration are normal.   Cranial nerves: CN I: not tested CN II: pupils equal, round and reactive to light, visual fields intact CN III, IV, VI:  full range of motion, no nystagmus, no ptosis CN V: facial sensation intact CN VII: upper and lower face symmetric CN VIII: hearing intact to conversation CN IX, X: gag intact, uvula midline CN XI: sternocleidomastoid and trapezius muscles intact CN XII: tongue midline Bulk & Tone: normal, no fasciculations. Motor: 5/5 throughout with no pronator drift. Sensation: intact to light touch, cold.  [name] test negative Deep Tendon Reflexes: brisk + throughout, no ankle clonus, negative Hoffman sign Cerebellar: no incoordination on finger to nose testing Gait: slow and cautious, narrow-based, no ataxia Tremor: none  IMPRESSION: This is a 53 year old right-handed woman with a history of metastatic hepatocellular carcinoma with cirrhosis, portal vein thrombus, bipolar disorder, presenting for evaluation of seizures. There is note of psychogenic non-epileptic events, she has not been on Depakote for many years. She and her grandson report recurrent staring/unresponsive episodes occurring a few times a week. MRI brain with and without contrast and a 1-hour EEG will be ordered to assess for focal abnormalities that increase risk for recurrent seizures. If normal, we will plan for a 48-hour EEG to further classify seizures. [place] driving laws were discussed with the patient, and she knows to stop driving after a seizure, until 6 months seizure-free. She does not drive. Continue close supervision. Follow-up after tests, they know to call for any changes.   Thank you for allowing me to participate in the care  of this patient. Please do not hesitate to call for any questions or concerns.   Ellouise Newer, M.D.  CC: Dr. Margarita [name], Dr. Learta Codding

## 2019-12-11 NOTE — Telephone Encounter (Signed)
Scheduled appointments per 8/31 los. Patient is aware of appointments date and times. I also sent referrals using [facility].

## 2019-12-12 ENCOUNTER — Encounter: Payer: Self-pay | Admitting: *Deleted

## 2019-12-12 ENCOUNTER — Ambulatory Visit (HOSPITAL_COMMUNITY): Admission: RE | Admit: 2019-12-12 | Payer: Medicaid Other | Source: Ambulatory Visit

## 2019-12-12 NOTE — Progress Notes (Signed)
Faxed referral order and chart information for home health nurse to [facility], [facility] and [facility] and all declined to accept patient due to staffing issues. Awaiting response from [facility].

## 2019-12-13 ENCOUNTER — Telehealth: Payer: Self-pay | Admitting: *Deleted

## 2019-12-13 NOTE — Telephone Encounter (Signed)
Called to report she forgot about her appointment yesterday for paracentesis. Rescheduled it for her for 12/17/19 at 1:454/2:00 at [facility]. Informed her that the appointment is in [place] as well. Informed her that [facility] is not able to accept her referral due to staffing. [facility] does not have contract with Medicaid.

## 2019-12-17 ENCOUNTER — Other Ambulatory Visit: Payer: Self-pay

## 2019-12-17 ENCOUNTER — Ambulatory Visit (HOSPITAL_COMMUNITY)
Admission: RE | Admit: 2019-12-17 | Discharge: 2019-12-17 | Disposition: A | Discharge status: Home / Self Care | Payer: Medicaid Other | Source: Ambulatory Visit | Attending: Oncology | Admitting: Oncology

## 2019-12-17 ENCOUNTER — Ambulatory Visit (HOSPITAL_COMMUNITY): Payer: Medicaid Other

## 2019-12-17 ENCOUNTER — Telehealth: Payer: Self-pay | Admitting: *Deleted

## 2019-12-17 ENCOUNTER — Encounter (HOSPITAL_COMMUNITY): Payer: Self-pay

## 2019-12-17 DIAGNOSIS — R188 Other ascites: Secondary | ICD-10-CM | POA: Insufficient documentation

## 2019-12-17 DIAGNOSIS — C220 Liver cell carcinoma: Secondary | ICD-10-CM | POA: Diagnosis not present

## 2019-12-17 HISTORY — PX: IR PARACENTESIS: IMG2679

## 2019-12-17 MED ORDER — LIDOCAINE HCL 1 % IJ SOLN
INTRAMUSCULAR | Status: AC
  Filled 2019-12-17: qty 20

## 2019-12-17 MED ORDER — LIDOCAINE HCL 1 % IJ SOLN
INTRAMUSCULAR | Status: AC | PRN
  Administered 2019-12-17: 10 mL

## 2019-12-17 NOTE — Telephone Encounter (Signed)
Patient had called answering service on 12/14/19 to report she was bitten by her pet mouse on hand. Said that the area was red. Called her today and she reports no pain and the redness has resolved.

## 2019-12-17 NOTE — Telephone Encounter (Signed)
Form has been faxed.

## 2019-12-17 NOTE — Procedures (Signed)
PROCEDURE SUMMARY:  Successful image-guided paracentesis from the right lateral abdomen.  Yielded 3.5 liters of hazy amber fluid.  No immediate complications.  EBL = 0 mL. Patient tolerated well.   Specimen was not sent for labs.  Please see imaging section of Epic for full dictation.   Brittay Mogle PA-C 12/17/2019 3:00 PM

## 2019-12-18 ENCOUNTER — Other Ambulatory Visit: Payer: Self-pay

## 2019-12-18 ENCOUNTER — Inpatient Hospital Stay: Payer: Medicaid Other | Attending: Oncology

## 2019-12-18 ENCOUNTER — Inpatient Hospital Stay (HOSPITAL_BASED_OUTPATIENT_CLINIC_OR_DEPARTMENT_OTHER): Payer: Medicaid Other | Admitting: Nurse Practitioner

## 2019-12-18 ENCOUNTER — Encounter: Payer: Self-pay | Admitting: Nurse Practitioner

## 2019-12-18 VITALS — BP 137/68 | HR 88 | Temp 97.8°F | Ht 62.0 in | Wt 110.5 lb

## 2019-12-18 DIAGNOSIS — R600 Localized edema: Secondary | ICD-10-CM | POA: Diagnosis not present

## 2019-12-18 DIAGNOSIS — B1920 Unspecified viral hepatitis C without hepatic coma: Secondary | ICD-10-CM | POA: Insufficient documentation

## 2019-12-18 DIAGNOSIS — G893 Neoplasm related pain (acute) (chronic): Secondary | ICD-10-CM | POA: Diagnosis not present

## 2019-12-18 DIAGNOSIS — Z8616 Personal history of COVID-19: Secondary | ICD-10-CM | POA: Diagnosis not present

## 2019-12-18 DIAGNOSIS — C220 Liver cell carcinoma: Secondary | ICD-10-CM | POA: Insufficient documentation

## 2019-12-18 DIAGNOSIS — R188 Other ascites: Secondary | ICD-10-CM | POA: Diagnosis not present

## 2019-12-18 DIAGNOSIS — K7460 Unspecified cirrhosis of liver: Secondary | ICD-10-CM | POA: Diagnosis not present

## 2019-12-18 DIAGNOSIS — R630 Anorexia: Secondary | ICD-10-CM | POA: Insufficient documentation

## 2019-12-18 DIAGNOSIS — F319 Bipolar disorder, unspecified: Secondary | ICD-10-CM | POA: Insufficient documentation

## 2019-12-18 DIAGNOSIS — R110 Nausea: Secondary | ICD-10-CM | POA: Insufficient documentation

## 2019-12-18 DIAGNOSIS — Z5112 Encounter for antineoplastic immunotherapy: Secondary | ICD-10-CM | POA: Diagnosis not present

## 2019-12-18 DIAGNOSIS — K625 Hemorrhage of anus and rectum: Secondary | ICD-10-CM | POA: Diagnosis not present

## 2019-12-18 LAB — CMP (CANCER CENTER ONLY)
ALT: 40 U/L (ref 0–44)
AST: 118 U/L — ABNORMAL HIGH (ref 15–41)
Albumin: 2.6 g/dL — ABNORMAL LOW (ref 3.5–5.0)
Alkaline Phosphatase: 110 U/L (ref 38–126)
Anion gap: 9 (ref 5–15)
BUN: 24 mg/dL — ABNORMAL HIGH (ref 6–20)
CO2: 22 mmol/L (ref 22–32)
Calcium: 8.8 mg/dL — ABNORMAL LOW (ref 8.9–10.3)
Chloride: 106 mmol/L (ref 98–111)
Creatinine: 1.71 mg/dL — ABNORMAL HIGH (ref 0.44–1.00)
GFR, Est AFR Am: 39 mL/min — ABNORMAL LOW (ref >60)
GFR, Estimated: 33 mL/min — ABNORMAL LOW (ref >60)
Glucose, Bld: 88 mg/dL (ref 70–99)
Potassium: 4.9 mmol/L (ref 3.5–5.1)
Sodium: 137 mmol/L (ref 135–145)
Total Bilirubin: 2.7 mg/dL — ABNORMAL HIGH (ref 0.3–1.2)
Total Protein: 6.7 g/dL (ref 6.5–8.1)

## 2019-12-18 LAB — CYTOLOGY - NON PAP

## 2019-12-18 NOTE — Progress Notes (Signed)
[facility] OFFICE PROGRESS NOTE   Diagnosis:  Hepatocellular carcinoma, cirrhosis  INTERVAL HISTORY:   Valerie [name] returns as scheduled. She underwent a paracentesis yesterday with 3.5 liters of fluid removed. She feels the fluid has reaccumulated. She reports increased abdominal pain this morning, also having nausea.  No recent seizure.  Objective:  Vital signs in last 24 hours:  Blood pressure 137/68, pulse 88, temperature 97.8 F (36.6 C), temperature source Oral, height 5\' 2"  (1.575 m), weight 110 lb 8 oz (50.1 kg), SpO2 100 %.    Resp: Lungs clear. Breath sounds diminished right lower lung field. No respiratory distress. Cardio: Regular rate and rhythm.  GI: Abdomen is soft, distended, consistent with ascites.  Vascular: No leg edema.   Lab Results:  Lab Results  Component Value Date   WBC 4.2 12/09/2019   HGB 11.9 (L) 12/09/2019   HCT 35.4 (L) 12/09/2019   MCV 97.3 12/09/2019   PLT 53 (L) 12/09/2019   NEUTROABS 2.2 12/06/2019    Imaging:  IR Paracentesis  Result Date: 12/17/2019 INDICATION: Patient with history of HCC, hepatitis C, cirrhosis, and recurrent ascites. Request is made for diagnostic and therapeutic paracentesis up to 5 L. EXAM: ULTRASOUND GUIDED DIAGNOSTIC AND THERAPEUTIC PARACENTESIS MEDICATIONS: 10 mL 1% lidocaine COMPLICATIONS: None immediate. PROCEDURE: Informed written consent was obtained from the patient after a discussion of the risks, benefits and alternatives to treatment. A timeout was performed prior to the initiation of the procedure. Initial ultrasound scanning demonstrates a moderate amount of ascites within the right lower abdominal quadrant. The right lower abdomen was prepped and draped in the usual sterile fashion. 1% lidocaine was used for local anesthesia. Following this, a 19 gauge, 7-cm, Yueh catheter was introduced. An ultrasound image was saved for documentation purposes. The paracentesis was performed. The catheter was  removed and a dressing was applied. The patient tolerated the procedure well without immediate post procedural complication. FINDINGS: A total of approximately 3.5 L of hazy amber fluid was removed. Samples were sent to the laboratory as requested by the clinical team. IMPRESSION: Successful ultrasound-guided paracentesis yielding 3.5 L of peritoneal fluid. Read by: Earley Abide, PA-C Electronically Signed   By: Corrie Mckusick D.O.   On: 12/17/2019 15:15    Medications: I have reviewed the patient's current medications.  Assessment/Plan: 1.Liver and lung lesions concerning for metastatic cancer, ?Sac primary -CT 06/28/2019-probable cirrhosis, large heterogenous mass in the right liver extending to the right lung base with possible violation of the capsule and invasion of the right hemidiaphragm, at least 1 additional mass in the inferior right liver -06/29/2019 MRI of the abdomen-probable diffusely infiltrative hepatocellular carcinoma throughout the right lobe of the liver which appears to extend out of the hepatic capsule and invade the right hemidiaphragm, tumor thrombus involving the right portal and hepatic veins, esophageal and proximal gastric varices -06/30/2019 CT of the chest without contrast-multiple pulmonary nodules concerning for widespread metastatic disease to the lungs, large right lobe liver mass with transdiaphragmatic invasion into the lower right hemithorax involving portions of the right lower lobe -07/01/2019 -[id number] -Cycle 1atezolizumab/bevacizumab 07/12/2019 -Cycle 2 atezolizumab/bevacizumab 08/02/2019 -Cycle 3 atezolizumab/bevacizumab 08/22/2019 -CT 08/27/2019-new ascites, decreased right liver mass with diminished diaphragmatic involvement, improved pulmonary nodules -Cycle 4 atezolizumab/bevacizumab 09/19/2019 -Cycle 5 atezolizumab/bevacizumab 10/23/2019 -Cycle 6 atezolizumab/bevacizumab 11/12/2019 -Cycle 7 atezolizumab/bevacizumab 12/03/2019 -CTs 12/06/2019-head without  acute abnormality, near complete resolution of lung nodules, stable right liver mass, new right portal vein thrombosis, changes of cirrhosis, mild splenomegaly, varices 2. Hepatitis C -  Reports prior treatment with 16 weeks of interferon ribavirin -06/29/2019 HCV RNA quantitative 634,000 3. Cirrhosis 4. Pseudoseizures-followed by neurology 5. Bipolar disorder 6. Anemia and thrombocytopenia 7.  History ofelevated LFTs 8.Admission with COVID-19 pneumonia 08/27/2019, treated with steroids and remdesivir 9.  Abdominal pain   Disposition: [name] appears stable. She has completed 7 cycles of atezolizumab/avastin. Recent restaging CTs showed no evidence of progression of the cancer.   She has rapidly accumulating ascites. She has been referred to gastroenterology. She continues HCTZ and spironolactone.  She will return for lab, follow-up and treatment in 1 week.  Plan reviewed with Dr. Benay Spice.   Ned Card ANP/GNP-BC   12/18/2019  9:18 AM

## 2019-12-19 ENCOUNTER — Telehealth: Payer: Self-pay | Admitting: Nurse Practitioner

## 2019-12-19 ENCOUNTER — Encounter: Payer: Self-pay | Admitting: Pharmacy Technician

## 2019-12-19 NOTE — Progress Notes (Signed)
Patient no longer getting Mvasi from [place] based on medicaid coverage. Last DOS covered is 11/12/19. Patient no longer getting Tecentriq from [place] based on medicaid coverage. Last DOS covered is 11/12/19. Patient no longer getting Avastin from [place] based on medicaid coverage. No doses given.

## 2019-12-19 NOTE — Telephone Encounter (Signed)
No new appointments scheduled per 9/8 los.

## 2019-12-20 MED ORDER — FLUOXETINE HCL 40 MG PO CAPS
40.0000 mg | ORAL_CAPSULE | Freq: Every day | ORAL | 6 refills | Status: AC
Start: 2019-12-20 — End: 2020-02-18

## 2019-12-20 MED ORDER — PANTOPRAZOLE SODIUM 40 MG PO TBEC: 40.0000 mg | DELAYED_RELEASE_TABLET | Freq: Every day | ORAL | 3 refills | Status: DC

## 2019-12-20 MED FILL — PANTOPRAZOLE SOD DR 40 MG T: 40 | 90 days supply | Qty: 90 | Fill #0

## 2019-12-20 MED FILL — FLUoxetine HCL 40 MG CAPS: 40 | 90 days supply | Qty: 90 | Fill #0

## 2019-12-22 ENCOUNTER — Other Ambulatory Visit: Payer: Self-pay | Admitting: Oncology

## 2019-12-24 ENCOUNTER — Other Ambulatory Visit: Payer: Medicaid Other

## 2019-12-25 ENCOUNTER — Telehealth: Payer: Self-pay

## 2019-12-25 ENCOUNTER — Inpatient Hospital Stay (HOSPITAL_BASED_OUTPATIENT_CLINIC_OR_DEPARTMENT_OTHER): Payer: Medicaid Other | Admitting: Oncology

## 2019-12-25 ENCOUNTER — Inpatient Hospital Stay: Payer: Medicaid Other

## 2019-12-25 ENCOUNTER — Telehealth: Payer: Self-pay | Admitting: *Deleted

## 2019-12-25 ENCOUNTER — Other Ambulatory Visit: Payer: Self-pay

## 2019-12-25 ENCOUNTER — Ambulatory Visit: Payer: Medicaid Other | Attending: Family Medicine | Admitting: Family Medicine

## 2019-12-25 ENCOUNTER — Encounter: Payer: Self-pay | Admitting: Family Medicine

## 2019-12-25 VITALS — BP 118/79 | HR 56 | Ht 62.0 in | Wt 113.8 lb

## 2019-12-25 VITALS — BP 110/80 | HR 63 | Temp 97.9°F | Resp 20 | Ht 62.0 in | Wt 113.8 lb

## 2019-12-25 DIAGNOSIS — K7460 Unspecified cirrhosis of liver: Secondary | ICD-10-CM | POA: Diagnosis not present

## 2019-12-25 DIAGNOSIS — R188 Other ascites: Secondary | ICD-10-CM

## 2019-12-25 DIAGNOSIS — N1832 Chronic kidney disease, stage 3b: Secondary | ICD-10-CM

## 2019-12-25 DIAGNOSIS — C220 Liver cell carcinoma: Secondary | ICD-10-CM

## 2019-12-25 DIAGNOSIS — R296 Repeated falls: Secondary | ICD-10-CM

## 2019-12-25 DIAGNOSIS — F411 Generalized anxiety disorder: Secondary | ICD-10-CM

## 2019-12-25 DIAGNOSIS — Z5112 Encounter for antineoplastic immunotherapy: Secondary | ICD-10-CM | POA: Diagnosis not present

## 2019-12-25 DIAGNOSIS — I10 Essential (primary) hypertension: Secondary | ICD-10-CM

## 2019-12-25 LAB — CBC WITH DIFFERENTIAL (CANCER CENTER ONLY)
Abs Immature Granulocytes: 0.02 10*3/uL (ref 0.00–0.07)
Basophils Absolute: 0 10*3/uL (ref 0.0–0.1)
Basophils Relative: 1 %
Eosinophils Absolute: 0.1 10*3/uL (ref 0.0–0.5)
Eosinophils Relative: 2 %
HCT: 43.6 % (ref 36.0–46.0)
Hemoglobin: 14.9 g/dL (ref 12.0–15.0)
Immature Granulocytes: 0 %
Lymphocytes Relative: 30 %
Lymphs Abs: 1.7 10*3/uL (ref 0.7–4.0)
MCH: 32.1 pg (ref 26.0–34.0)
MCHC: 34.2 g/dL (ref 30.0–36.0)
MCV: 94 fL (ref 80.0–100.0)
Monocytes Absolute: 0.5 10*3/uL (ref 0.1–1.0)
Monocytes Relative: 8 %
Neutro Abs: 3.4 10*3/uL (ref 1.7–7.7)
Neutrophils Relative %: 59 %
Platelet Count: 88 10*3/uL — ABNORMAL LOW (ref 150–400)
RBC: 4.64 MIL/uL (ref 3.87–5.11)
RDW: 15.6 % — ABNORMAL HIGH (ref 11.5–15.5)
WBC Count: 5.8 10*3/uL (ref 4.0–10.5)
nRBC: 0 % (ref 0.0–0.2)

## 2019-12-25 LAB — CMP (CANCER CENTER ONLY)
ALT: 42 U/L (ref 0–44)
AST: 124 U/L — ABNORMAL HIGH (ref 15–41)
Albumin: 2.5 g/dL — ABNORMAL LOW (ref 3.5–5.0)
Alkaline Phosphatase: 130 U/L — ABNORMAL HIGH (ref 38–126)
Anion gap: 6 (ref 5–15)
BUN: 28 mg/dL — ABNORMAL HIGH (ref 6–20)
CO2: 23 mmol/L (ref 22–32)
Calcium: 8.5 mg/dL — ABNORMAL LOW (ref 8.9–10.3)
Chloride: 106 mmol/L (ref 98–111)
Creatinine: 1.74 mg/dL — ABNORMAL HIGH (ref 0.44–1.00)
GFR, Est AFR Am: 38 mL/min — ABNORMAL LOW (ref >60)
GFR, Estimated: 33 mL/min — ABNORMAL LOW (ref >60)
Glucose, Bld: 104 mg/dL — ABNORMAL HIGH (ref 70–99)
Potassium: 4.7 mmol/L (ref 3.5–5.1)
Sodium: 135 mmol/L (ref 135–145)
Total Bilirubin: 2.6 mg/dL — ABNORMAL HIGH (ref 0.3–1.2)
Total Protein: 6.7 g/dL (ref 6.5–8.1)

## 2019-12-25 LAB — TOTAL PROTEIN, URINE DIPSTICK: Protein, ur: 30 mg/dL — AB

## 2019-12-25 MED ORDER — SODIUM CHLORIDE 0.9 % IV SOLN
Freq: Once | INTRAVENOUS | Status: AC
  Filled 2019-12-25: qty 250

## 2019-12-25 MED ORDER — SODIUM CHLORIDE 0.9 % IV SOLN
1200.0000 mg | Freq: Once | INTRAVENOUS | Status: AC
  Administered 2019-12-25: 1200 mg via INTRAVENOUS
  Filled 2019-12-25: qty 20

## 2019-12-25 MED ORDER — HYDROMORPHONE HCL 2 MG PO TABS: 2.0000 mg | ORAL_TABLET | ORAL | 0 refills | Status: DC | PRN

## 2019-12-25 MED ORDER — FUROSEMIDE 20 MG PO TABS: 20.0000 mg | ORAL_TABLET | Freq: Every day | ORAL | 3 refills | Status: AC

## 2019-12-25 MED FILL — FUROSEMIDE 20 MG TABS: 20 | 30 days supply | Qty: 30 | Fill #0

## 2019-12-25 NOTE — Progress Notes (Signed)
Per Dr. Benay Spice: OK to treat w/platelts 88,000 and elevated creatinine, bili and AST. Will hold avastin today due to urine protein 30.

## 2019-12-25 NOTE — Progress Notes (Signed)
Patient declines flu vaccine.

## 2019-12-25 NOTE — Progress Notes (Signed)
Needs referral for home health for her [employer] service.

## 2019-12-25 NOTE — Progress Notes (Signed)
Name: Zell Doucette   MRN: [id number]    DOB: 03-27-1966   Date:12/25/2019       Progress Note  Subjective  Chief Complaint Chief Complaint  Patient presents with  . Hypertension      HPI Edie Vallandingham is a 54 yearold female who presents for a 3 month follow-up. Medical history is significant for hepatitis C (completed treatment with Ribavirin),cirrhosis, bipolar disorder, pseudoseizures, hepatocellular carcinoma with pulmonary metastases diagnosed in 06/2019, hypertension, and generalized anxiety disorder.  Hepatocellular carcinoma: Sees oncology, last appointment today 12/25/19. She completed a cycle of atezolizumab today. Last CT scan revealed absolute complete resolution of lung nodules and stable right liver mass. Her next appointment with oncology is 9/24. She is scheduled to see GI on 10/13, but oncology is trying to expedite the appointment. She will need a paracentesis.  Generalized anxiety disorder: States she is having a hard time.Her PHQ-9 and GAD-7 revealed anxiety and depression. Thinks that she won't make it, has seen others die from liver caner. She has some financial issues and had a recent loss of a loved one from [place]. She denies suicidal or homicidal ideations. She has talked to [facility] in the past via telephone. Thinks she will benefit from talk therapy. She states she has a stable support system, which includes her six children and grandchildren. She lives with her daughter and grandson, however is at home alone during the daytime.   Falls: She feels unsteady at times. Has had several falls in the past few months. Reports some dizziness after changing from a sitting to standing position, contributing to her falls. She has a neurology appointment next month. She has has a scheduled MRI of brain on 01/08/20.   PHQ2/9: Depression screen PHQ 2/9 12/25/2019  Decreased Interest 2  Down, Depressed, Hopeless 1  PHQ - 2 Score 3  Altered sleeping 3  Tired, decreased energy 3   Change in appetite 3  Feeling bad or failure about yourself  1  Trouble concentrating 2  Moving slowly or fidgety/restless 0  Suicidal thoughts 1  PHQ-9 Score 16   GAD 7 : Generalized Anxiety Score 12/25/2019 12/25/2019  Nervous, Anxious, on Edge 3 3  Control/stop worrying 3 2  Worry too much - different things 2 2  Trouble relaxing 2 2  Restless 3 1  Easily annoyed or irritable 3 2  Afraid - awful might happen 2 2  Total GAD 7 Score 18 14  Anxiety Difficulty Extremely difficult -     Patient Active Problem List   Diagnosis Date Noted  . Pneumonia due to COVID-19 virus 09/05/2019  . Neutropenia ([place]) 08/27/2019  . Febrile neutropenia ([place]) 08/27/2019  . COVID-19 08/27/2019  . Encounter for antineoplastic immunotherapy 07/12/2019  . Encounter for antineoplastic chemotherapy 07/12/2019  . Hepatocellular carcinoma ([place]) 07/04/2019  . Goals of care, counseling/discussion 07/04/2019  . Liver mass   . Fever 06/29/2019  . SIRS (systemic inflammatory response syndrome) ([place]) 06/29/2019  . Abdominal pain 06/29/2019  . Hepatitis C 11/27/2016  . Thrombocytopenia ([place]) 11/27/2016  . Liver cirrhosis ([place]) 11/27/2016  . Elevated liver enzymes 11/27/2016  . Migraine 11/27/2016  . Hemiplegic migraine 11/27/2016  . Marijuana abuse 11/27/2016  . Normocytic anemia 11/27/2016  . Right sided weakness 11/26/2016    Past Medical History:  Diagnosis Date  . Bipolar disorder ([place])   . Hepatitis C   . Hepatocellular carcinoma ([place])   . Liver cirrhosis ([place])   . Pseudoseizures     Past Surgical History:  Procedure Laterality Date  . CESAREAN SECTION    . IR PARACENTESIS  12/06/2019  . IR PARACENTESIS  12/17/2019    Social History   Tobacco Use  . Smoking status: Former Smoker    Packs/day: 1.00    Years: 20.00    Pack years: 20.00    Types: Cigarettes    Quit date: 06/2019    Years since quitting: 0.5  . Smokeless tobacco: Never Used  Substance Use Topics  . 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  albuterol (VENTOLIN HFA) 108 (90 Base) MCG/ACT inhaler, Inhale 1 puff into the lungs every 4 (four) hours as needed for wheezing or shortness of breath., Disp: 18 g, Rfl: 0 .  FLUoxetine (PROZAC) 40 MG capsule, Take 1 capsule (40 mg total) by mouth daily., Disp: 30 capsule, Rfl: 6 .  hydrochlorothiazide (HYDRODIURIL) 25 MG tablet, Take 1 tablet (25 mg total) by mouth daily., Disp: 30 tablet, Rfl: 6 .  HYDROmorphone (DILAUDID) 2 MG tablet, Take 1-2 tablets (2-4 mg total) by mouth every 4 (four) hours as needed for severe pain., Disp: 60 tablet, Rfl: 0 .  loperamide (IMODIUM A-D) 2 MG tablet, Take 2-4 mg by mouth 4 (four) times daily as needed. , Disp: , Rfl:  .  Nutritional Supplements (ENSURE COMPLETE PO), Take 1 Can by mouth 3 (three) times daily., Disp: , Rfl:  .  ondansetron (ZOFRAN) 8 MG tablet, Take 1 tablet (8 mg total) by mouth every 8 (eight) hours as needed for nausea or vomiting., Disp: 30 tablet, Rfl: 2 .  pantoprazole (PROTONIX) 40 MG tablet, Take 1 tablet (40 mg total) by mouth daily., Disp: 30 tablet, Rfl: 3 .  polyethylene glycol (MIRALAX / GLYCOLAX) 17 g packet, Take 17 g by mouth daily as needed for mild constipation., Disp: , Rfl:  .  potassium chloride SA (KLOR-CON) 20 MEQ tablet, Take 1 tablet (20 mEq total) by mouth daily., Disp: 30 tablet, Rfl: 2 .  spironolactone (ALDACTONE) 50 MG tablet, Take 1 tablet (50 mg total) by mouth daily., Disp: 30 tablet, Rfl: 2 .  sennosides-docusate sodium (SENOKOT-S) 8.6-50 MG tablet, Take 1-2 tablets by mouth 2 (two) times daily as needed. (Patient not taking: Reported on 12/25/2019), Disp: , Rfl:   Allergies  Allergen Reactions  . Norco [Hydrocodone-Acetaminophen] Anaphylaxis and Shortness Of Breath  . Tylenol [Acetaminophen] Other (See Comments)    Cannot take due to liver    Review of Systems  Constitutional: Positive for chills and  malaise/fatigue. Negative for fever.  Respiratory: Negative for cough, shortness of breath and wheezing.   Gastrointestinal: Positive for abdominal pain. Negative for constipation and nausea.  Neurological: Positive for dizziness and weakness. Negative for tingling and tremors.  Psychiatric/Behavioral: Positive for depression. The patient is nervous/anxious.      No other specific complaints in a complete review of systems (except as listed in HPI above).  Objective  Vitals:   12/25/19 1519  BP: 118/79  Pulse: (!) 56  SpO2: 100%  Weight: 113 lb 12.8 oz (51.6 kg)  Height: 5\' 2"  (1.575 m)    Body mass index is 20.81 kg/m.  Nursing Note and Vital Signs reviewed.  Physical Exam Constitutional:      Appearance: She is ill-appearing.     Comments: Chronically ill-appearing; wrapped up in blankets   Cardiovascular:     Rate and Rhythm: Bradycardia present.     Heart  sounds: Normal heart sounds and S2 normal.  Pulmonary:     Effort: Pulmonary effort is normal. No respiratory distress.     Breath sounds: Normal breath sounds. No wheezing.  Abdominal:     General: There is distension.     Tenderness: There is abdominal tenderness in the right upper quadrant.  Musculoskeletal:     Right lower leg: 1+ Edema present.     Left lower leg: 1+ Edema present.  Neurological:     Mental Status: She is alert and oriented to person, place, and time.     Comments: Gait: unsteady gait  Psychiatric:        Attention and Perception: Attention and perception normal.        Mood and Affect: Mood is depressed.            Results for orders placed or performed in visit on 12/25/19 (from the past 48 hour(s))  CMP ([place] only)     Status: Abnormal   Collection Time: 12/25/19  9:30 AM  Result Value Ref Range   Sodium 135 135 - 145 mmol/L   Potassium 4.7 3.5 - 5.1 mmol/L   Chloride 106 98 - 111 mmol/L   CO2 23 22 - 32 mmol/L   Glucose, Bld 104 (H) 70 - 99 mg/dL    Comment:  Glucose reference range applies only to samples taken after fasting for at least 8 hours.   BUN 28 (H) 6 - 20 mg/dL   Creatinine 1.74 (H) 0.44 - 1.00 mg/dL   Calcium 8.5 (L) 8.9 - 10.3 mg/dL   Total Protein 6.7 6.5 - 8.1 g/dL   Albumin 2.5 (L) 3.5 - 5.0 g/dL   AST 124 (H) 15 - 41 U/L   ALT 42 0 - 44 U/L   Alkaline Phosphatase 130 (H) 38 - 126 U/L   Total Bilirubin 2.6 (H) 0.3 - 1.2 mg/dL   GFR, Est Non Af Am 33 (L) >60 mL/min   GFR, Est AFR Am 38 (L) >60 mL/min   Anion gap 6 5 - 15    Comment: Performed at [facility], [place] [address], [place], [place] [address]  CBC with Differential ([place] Only)     Status: Abnormal   Collection Time: 12/25/19  9:30 AM  Result Value Ref Range   WBC Count 5.8 4.0 - 10.5 K/uL   RBC 4.64 3.87 - 5.11 MIL/uL   Hemoglobin 14.9 12.0 - 15.0 g/dL   HCT 43.6 36 - 46 %   MCV 94.0 80.0 - 100.0 fL   MCH 32.1 26.0 - 34.0 pg   MCHC 34.2 30.0 - 36.0 g/dL   RDW 15.6 (H) 11.5 - 15.5 %   Platelet Count 88 (L) 150 - 400 K/uL   nRBC 0.0 0.0 - 0.2 %   Neutrophils Relative % 59 %   Neutro Abs 3.4 1.7 - 7.7 K/uL   Lymphocytes Relative 30 %   Lymphs Abs 1.7 0.7 - 4.0 K/uL   Monocytes Relative 8 %   Monocytes Absolute 0.5 0 - 1 K/uL   Eosinophils Relative 2 %   Eosinophils Absolute 0.1 0 - 0 K/uL   Basophils Relative 1 %   Basophils Absolute 0.0 0 - 0 K/uL   Immature Granulocytes 0 %   Abs Immature Granulocytes 0.02 0.00 - 0.07 K/uL    Comment: Performed at [facility], [place] [address], [place], [place] [address]  Total Protein, Urine dipstick  Status: Abnormal   Collection Time: 12/25/19  9:32 AM  Result Value Ref Range   Protein, ur 30 (A) NEGATIVE mg/dL    Comment: Performed at [facility], [address], [place], [place] [address]    Assessment & Plan 1. Hepatocellular carcinoma ([place]) With treatment responsive pulmonary metastasis Currently on Chemo Continue  following oncology.  Next appointment 9/24.   2. Essential hypertension Blood pressure on the low side Discontinued hydrochlorothiazide.  Reduced spironolactone to 25 mg.Patient within goal blood pressure <130/80.  3. Falls frequently Falls appears to related to position changes. Discontinued hydrochlorothiazide at this time. She spoke with Nurse Case Management, Opal Sidles and she was provided with a walker. She will also be referred to [facility] services to help provide home aid services.  4. Cirrhosis of liver with ascites, unspecified hepatic cirrhosis type ([place]) Will add furosemide due to ascites and ankle edema. Instructed patient to let office know if she is still having falls. She has a GI appointment on 10/13, however oncology is trying to expedite it. - furosemide (LASIX) 20 MG tablet; Take 1 tablet (20 mg total) by mouth daily.  Dispense: 30 tablet; Refill: 3  5. Stage 3b chronic kidney disease Avoid Nephrotoxins  6. Generalized anxiety disorder Anxiety is secondary to underlying medical conditions.  Currently on Prozac. Referred patient to social worker, Delana Meyer to help with anxiety.  Support groups will be beneficial  Return in about 3 months (around 03/25/2020) for chronic disease management.   Evaluation and management procedures were performed by me with NP Student in attendance, note written by NP student under my supervision and collaboration. I have reviewed the note and I agree with the management and plan.  Ms Vajda presents today for chronic disease management.  Her Hepatocellular carcinoma with pulmonary metastases is being managed by oncology whom she saw today and she is currently on chemotherapy with Atezolizumab/Bevacizumab and most recent CT abdomen and pelvis with contrast revealed portal vein thrombosis, cirrhotic appearance of the liver, findings in keeping with known hepatocellular carcinoma.  CT angiogram of the chest revealed findings consistent with treatment  responsive metastatic pulmonary disease.  Her anxiety and depression is worsened by her underlying medical conditions and she is currently on an SSRI and undergoing psychotherapy with LCSW.  We have discussed palliative care which she is not ready for at this time and so we will provide additional support by phone during patient care services, provision of a walker Case management services employed during her visit today. Advanced planning also discussed and information provided. I have reduced spironolactone dose and added on Lasix for better diuresis as she awaits her GI appointment. She might need anticoagulation for portal vein thrombosis; will defer to Oncology. Hydrochlorothiazide discontinued hopefully she will have some improvement in blood pressure with reduced risk for falls.  Brain metastases will also need to be excluded and she is awaiting imaging of her head which has been ordered by oncology    Charlott Rakes, MD, FAAFP. [facility] and [place], [place]   12/25/2019, 5:40 PM

## 2019-12-25 NOTE — Progress Notes (Signed)
[facility] OFFICE PROGRESS NOTE   Diagnosis: Hepatocellular carcinoma, cirrhosis  INTERVAL HISTORY:   Valerie [name] returns for a scheduled visit.  She last underwent a paracentesis for 3.5 L of fluid on 12/17/2019.  She reports feeling better after the paracentesis procedure.  The ascites has reaccumulated.  She reports a few episodes of rectal bleeding.  She continues to have abdominal pain.  She has nausea when the ascites increases.  Valerie [name] has balance difficulty and has experienced several falls.  No seizures.  She is scheduled to see neurology next month.  She has not seen gastroenterology.  Objective:  Vital signs in last 24 hours:  Blood pressure 110/80, pulse 63, temperature 97.9 F (36.6 C), temperature source Tympanic, resp. rate 20, height 5\' 2"  (1.575 m), weight 113 lb 12.8 oz (51.6 kg), SpO2 100 %.    Resp: Lungs clear bilaterally Cardio: Regular rate and rhythm GI: Distended with ascites, tender in the right upper abdomen Vascular: Trace ankle edema bilaterally Neuro: Alert and oriented, motor exam appears intact in the upper and lower extremities bilaterally, she ambulates with an unsteady gait Skin: No rash   Lab Results:  Lab Results  Component Value Date   WBC 5.8 12/25/2019   HGB 14.9 12/25/2019   HCT 43.6 12/25/2019   MCV 94.0 12/25/2019   PLT 88 (L) 12/25/2019   NEUTROABS 3.4 12/25/2019    CMP  Lab Results  Component Value Date   NA 135 12/25/2019   K 4.7 12/25/2019   CL 106 12/25/2019   CO2 23 12/25/2019   GLUCOSE 104 (H) 12/25/2019   BUN 28 (H) 12/25/2019   CREATININE 1.74 (H) 12/25/2019   CALCIUM 8.5 (L) 12/25/2019   PROT 6.7 12/25/2019   ALBUMIN 2.5 (L) 12/25/2019   AST 124 (H) 12/25/2019   ALT 42 12/25/2019   ALKPHOS 130 (H) 12/25/2019   BILITOT 2.6 (H) 12/25/2019   GFRNONAA 33 (L) 12/25/2019   GFRAA 38 (L) 12/25/2019     Medications: I have reviewed the patient's current  medications.   Assessment/Plan: 1.Liver and lung lesions concerning for metastatic cancer, ?[place] primary -CT 06/28/2019-probable cirrhosis, large heterogenous mass in the right liver extending to the right lung base with possible violation of the capsule and invasion of the right hemidiaphragm, at least 1 additional mass in the inferior right liver -06/29/2019 MRI of the abdomen-probable diffusely infiltrative hepatocellular carcinoma throughout the right lobe of the liver which appears to extend out of the hepatic capsule and invade the right hemidiaphragm, tumor thrombus involving the right portal and hepatic veins, esophageal and proximal gastric varices -06/30/2019 CT of the chest without contrast-multiple pulmonary nodules concerning for widespread metastatic disease to the lungs, large right lobe liver mass with transdiaphragmatic invasion into the lower right hemithorax involving portions of the right lower lobe -07/01/2019 -[id number] -Cycle 1atezolizumab/bevacizumab 07/12/2019 -Cycle 2 atezolizumab/bevacizumab 08/02/2019 -Cycle 3 atezolizumab/bevacizumab 08/22/2019 -CT 08/27/2019-new ascites, decreased right liver mass with diminished diaphragmatic involvement, improved pulmonary nodules -Cycle 4 atezolizumab/bevacizumab 09/19/2019 -Cycle 5 atezolizumab/bevacizumab 10/23/2019 -Cycle 6 atezolizumab/bevacizumab 11/12/2019 -Cycle 7 atezolizumab/bevacizumab 12/03/2019 -CTs 12/06/2019-head without acute abnormality, near complete resolution of lung nodules, stable right liver mass, new right portal vein thrombosis, changes of cirrhosis, mild splenomegaly, varices -Cycle 8 atezolizumab 12/25/2019 2. Hepatitis C -Reports prior treatment with 16 weeks of interferon ribavirin -06/29/2019 HCV RNA quantitative 634,000 3. Cirrhosis 4. Pseudoseizures-followed by neurology 5. Bipolar disorder 6. Anemia and thrombocytopenia 7.  History ofelevated LFTs 8.Admission with COVID-19 pneumonia 08/27/2019,  treated  with steroids and remdesivir 9.  Abdominal pain    Disposition: Her overall status appears unchanged.  She has refractory ascites.  We will refer her for another paracentesis procedure.  She will complete treatment with atezolizumab today.  Avastin will be held secondary to the progressive renal failure and portal vein thrombosis.  I doubt the renal insufficiency is related to atezolizumab.  The increased creatinine may be related to intravascular depletion with recent paracentesis procedures.  We will try to expedite the GI appointment.  Valerie [name] will return for an office visit in approximately 10 days.  She will benefit from a home health nurse.  Betsy Coder, MD  12/25/2019  10:43 AM

## 2019-12-25 NOTE — Patient Instructions (Signed)

## 2019-12-25 NOTE — Telephone Encounter (Signed)
Met with the patient when she was in the clinic.  She is currently living with her family.  She receives disability.   She explained that her gait is unsteady and she could benefit from a walker and a RW was provided to her.    She is requesting assistance with personal care and said that she has not been contacted by [employer] yet for [employer].  She was agreeable to having a new referral submitted.  She now has [employer].  She inquired about establishing a living will and information was provided to her about Advanced Directives.

## 2019-12-25 NOTE — Telephone Encounter (Signed)
Called patient and she reports she is driving around trying to remember where to come for her appointment today. She said she can be at the [facility] in 10 minutes. Instructed her to come to [facility] next to [facility] and we will get her worked in.

## 2019-12-25 NOTE — Patient Instructions (Signed)
[facility] Discharge Instructions for Patients Receiving Chemotherapy  Today you received the following chemotherapy agents: atezolizumab.  To help prevent nausea and vomiting after your treatment, we encourage you to take your nausea medication as directed.   If you develop nausea and vomiting that is not controlled by your nausea medication, call the clinic.   BELOW ARE SYMPTOMS THAT SHOULD BE REPORTED IMMEDIATELY:  *FEVER GREATER THAN 100.5 F  *CHILLS WITH OR WITHOUT FEVER  NAUSEA AND VOMITING THAT IS NOT CONTROLLED WITH YOUR NAUSEA MEDICATION  *UNUSUAL SHORTNESS OF BREATH  *UNUSUAL BRUISING OR BLEEDING  TENDERNESS IN MOUTH AND THROAT WITH OR WITHOUT PRESENCE OF ULCERS  *URINARY PROBLEMS  *BOWEL PROBLEMS  UNUSUAL RASH Items with * indicate a potential emergency and should be followed up as soon as possible.  Feel free to call the clinic should you have any questions or concerns. The clinic phone number is [phone].  Please show the [place] at check-in to the Emergency Department and triage nurse.

## 2019-12-26 ENCOUNTER — Other Ambulatory Visit: Payer: Self-pay | Admitting: *Deleted

## 2019-12-26 ENCOUNTER — Telehealth: Payer: Self-pay | Admitting: Oncology

## 2019-12-26 DIAGNOSIS — C220 Liver cell carcinoma: Secondary | ICD-10-CM

## 2019-12-26 NOTE — Telephone Encounter (Signed)
Scheduled appointments per 9/15 los. Patient is aware of upcoming appointments date and times.

## 2019-12-26 NOTE — Progress Notes (Signed)
Message from central scheduling to put in new order for IR paracentesis so it can be done at [facility]. [facility] has no openings this week or Monday.  Order placed and central scheduling notified.

## 2019-12-27 ENCOUNTER — Telehealth: Payer: Self-pay

## 2019-12-27 NOTE — Telephone Encounter (Signed)
Completed PCS application faxed to [facility]

## 2019-12-30 ENCOUNTER — Other Ambulatory Visit: Payer: Self-pay

## 2019-12-30 ENCOUNTER — Ambulatory Visit (HOSPITAL_COMMUNITY)
Admission: RE | Admit: 2019-12-30 | Discharge: 2019-12-30 | Disposition: A | Discharge status: Home / Self Care | Payer: Medicaid Other | Source: Ambulatory Visit | Attending: Oncology | Admitting: Oncology

## 2019-12-30 DIAGNOSIS — C220 Liver cell carcinoma: Secondary | ICD-10-CM | POA: Insufficient documentation

## 2019-12-30 DIAGNOSIS — R180 Malignant ascites: Secondary | ICD-10-CM | POA: Insufficient documentation

## 2019-12-30 HISTORY — PX: IR PARACENTESIS: IMG2679

## 2019-12-30 MED ORDER — LIDOCAINE HCL 1 % IJ SOLN
INTRAMUSCULAR | Status: AC
  Filled 2019-12-30: qty 20

## 2019-12-30 MED ORDER — LIDOCAINE HCL 1 % IJ SOLN
INTRAMUSCULAR | Status: DC | PRN
  Administered 2019-12-30: 10 mL

## 2019-12-30 NOTE — Procedures (Signed)
PROCEDURE SUMMARY:  Successful US guided paracentesis from left abdomen Yielded 3.1 L of clear yellow fluid.  No immediate complications.  Pt tolerated well.   EBL < 27mL  Maurilio Puryear R Gottlieb Zuercher, NP 12/30/2019 3:20 PM

## 2020-01-02 ENCOUNTER — Ambulatory Visit (INDEPENDENT_AMBULATORY_CARE_PROVIDER_SITE_OTHER): Payer: Medicaid Other | Admitting: Neurology

## 2020-01-02 ENCOUNTER — Other Ambulatory Visit: Payer: Self-pay

## 2020-01-02 DIAGNOSIS — R404 Transient alteration of awareness: Secondary | ICD-10-CM

## 2020-01-03 ENCOUNTER — Inpatient Hospital Stay: Payer: Medicaid Other

## 2020-01-03 ENCOUNTER — Telehealth: Payer: Self-pay | Admitting: Nurse Practitioner

## 2020-01-03 ENCOUNTER — Inpatient Hospital Stay: Payer: Medicaid Other | Admitting: Nurse Practitioner

## 2020-01-03 NOTE — Telephone Encounter (Signed)
Rescheduled appointments per 9/24 patient call. Patient is aware of updated appointment time and day.

## 2020-01-06 ENCOUNTER — Inpatient Hospital Stay: Payer: Medicaid Other

## 2020-01-06 ENCOUNTER — Encounter: Payer: Self-pay | Admitting: Nurse Practitioner

## 2020-01-06 ENCOUNTER — Other Ambulatory Visit: Payer: Self-pay

## 2020-01-06 ENCOUNTER — Inpatient Hospital Stay (HOSPITAL_BASED_OUTPATIENT_CLINIC_OR_DEPARTMENT_OTHER): Payer: Medicaid Other | Admitting: Nurse Practitioner

## 2020-01-06 VITALS — BP 112/74 | HR 73 | Temp 97.8°F | Resp 19 | Ht 62.0 in | Wt 108.8 lb

## 2020-01-06 DIAGNOSIS — C220 Liver cell carcinoma: Secondary | ICD-10-CM

## 2020-01-06 DIAGNOSIS — Z5112 Encounter for antineoplastic immunotherapy: Secondary | ICD-10-CM | POA: Diagnosis not present

## 2020-01-06 LAB — CMP (CANCER CENTER ONLY)
ALT: 38 U/L (ref 0–44)
AST: 114 U/L — ABNORMAL HIGH (ref 15–41)
Albumin: 2.3 g/dL — ABNORMAL LOW (ref 3.5–5.0)
Alkaline Phosphatase: 112 U/L (ref 38–126)
Anion gap: 4 — ABNORMAL LOW (ref 5–15)
BUN: 48 mg/dL — ABNORMAL HIGH (ref 6–20)
CO2: 26 mmol/L (ref 22–32)
Calcium: 8.3 mg/dL — ABNORMAL LOW (ref 8.9–10.3)
Chloride: 102 mmol/L (ref 98–111)
Creatinine: 3.65 mg/dL (ref 0.44–1.00)
GFR, Est AFR Am: 15 mL/min — ABNORMAL LOW (ref >60)
GFR, Estimated: 13 mL/min — ABNORMAL LOW (ref >60)
Glucose, Bld: 130 mg/dL — ABNORMAL HIGH (ref 70–99)
Potassium: 4.4 mmol/L (ref 3.5–5.1)
Sodium: 132 mmol/L — ABNORMAL LOW (ref 135–145)
Total Bilirubin: 1.5 mg/dL — ABNORMAL HIGH (ref 0.3–1.2)
Total Protein: 6.2 g/dL — ABNORMAL LOW (ref 6.5–8.1)

## 2020-01-06 NOTE — Progress Notes (Addendum)
[facility] OFFICE PROGRESS NOTE   Diagnosis: Hepatocellular carcinoma, cirrhosis  INTERVAL HISTORY:   Valerie [name] returns for follow-up.  She underwent a paracentesis procedure 12/30/2019 with 3.1 L of fluid removed.  She feels the fluid has partially reaccumulated and would like to have another paracentesis in a week. About 3 days ago she noted bilateral lower extremity edema.  She had 3 loose stools yesterday.  None today.  Appetite is poor.  Objective:  Vital signs in last 24 hours:  Blood pressure 112/74, pulse 73, temperature 97.8 F (36.6 C), temperature source Tympanic, resp. rate 19, height 5\' 2"  (1.575 m), weight 108 lb 12.8 oz (49.4 kg), SpO2 100 %.    HEENT: No thrush or ulcers. Resp: Lungs clear bilaterally. Cardio: Regular rate and rhythm. GI: Abdomen is distended consistent with ascites. Vascular: Trace pitting edema at the lower legs bilaterally. Neuro: Alert and oriented.   Lab Results:  Lab Results  Component Value Date   WBC 5.8 12/25/2019   HGB 14.9 12/25/2019   HCT 43.6 12/25/2019   MCV 94.0 12/25/2019   PLT 88 (L) 12/25/2019   NEUTROABS 3.4 12/25/2019    Imaging:  No results found.  Medications: I have reviewed the patient's current medications.  Assessment/Plan: 1.Liver and lung lesions concerning for metastatic cancer, ?[place] primary -CT 06/28/2019-probable cirrhosis, large heterogenous mass in the right liver extending to the right lung base with possible violation of the capsule and invasion of the right hemidiaphragm, at least 1 additional mass in the inferior right liver -06/29/2019 MRI of the abdomen-probable diffusely infiltrative hepatocellular carcinoma throughout the right lobe of the liver which appears to extend out of the hepatic capsule and invade the right hemidiaphragm, tumor thrombus involving the right portal and hepatic veins, esophageal and proximal gastric varices -06/30/2019 CT of the chest without  contrast-multiple pulmonary nodules concerning for widespread metastatic disease to the lungs, large right lobe liver mass with transdiaphragmatic invasion into the lower right hemithorax involving portions of the right lower lobe -07/01/2019 -[id number] -Cycle 1atezolizumab/bevacizumab 07/12/2019 -Cycle 2 atezolizumab/bevacizumab 08/02/2019 -Cycle 3 atezolizumab/bevacizumab 08/22/2019 -CT 08/27/2019-new ascites, decreased right liver mass with diminished diaphragmatic involvement, improved pulmonary nodules -Cycle 4 atezolizumab/bevacizumab 09/19/2019 -Cycle 5 atezolizumab/bevacizumab 10/23/2019 -Cycle 6 atezolizumab/bevacizumab 11/12/2019 -Cycle 7 atezolizumab/bevacizumab 12/03/2019 -CTs 12/06/2019-head without acute abnormality, near complete resolution of lung nodules, stable right liver mass, new right portal vein thrombosis, changes of cirrhosis, mild splenomegaly, varices -Cycle 8 atezolizumab 12/25/2019 2. Hepatitis C -Reports prior treatment with 16 weeks of interferon ribavirin -06/29/2019 HCV RNA quantitative 634,000 3. Cirrhosis 4. Pseudoseizures-followed by neurology 5. Bipolar disorder 6. Anemia and thrombocytopenia 7.History ofelevated LFTs 8.Admission with COVID-19 pneumonia 08/27/2019, treated with steroids and remdesivir 9.Abdominal pain    Disposition: Valerie [name] appears unchanged.  She completed a cycle of atezolizumab 12/25/2019.  Avastin was held secondary to progressive renal failure and portal vein thrombosis.  We reviewed the chemistry panel from today.  She has progressive renal failure.  She will hold Lasix and Aldactone.  Hold on further paracentesis procedures for now.  She will return to the lab for urinalysis.  Plan to repeat renal function on 01/08/2020.  She will return for lab, follow-up, possible treatment in 1 week.  Patient seen with Dr. Benay Spice.   Ned Card ANP/GNP-BC   01/06/2020  2:55 PM This was a shared visit with Ned Card.  Valerie [name]  appears unchanged, but she has developed progressive renal failure.  The renal failure is likely related to polypharmacy and  recent paracentesis procedures.  I doubt the renal failure is due to toxicity from atezolizumab.  Valerie [name] will hold spironolactone and furosemide.  She will return for a repeat chemistry panel in a few days.  Julieanne Manson, MD

## 2020-01-06 NOTE — Progress Notes (Signed)
CRITICAL VALUE STICKER  CRITICAL VALUE: Creatinine 3.65  RECEIVER (on-site recipient of call):Ernestine Langworthy,RN   DATE & TIME NOTIFIED: 01/06/20 @ 1504  MESSENGER (representative from lab):M. Steward  MD NOTIFIED: Ned Card, NP  TIME OF NOTIFICATION: 1516  RESPONSE: In process of office visit.

## 2020-01-07 ENCOUNTER — Telehealth: Payer: Self-pay | Admitting: Nurse Practitioner

## 2020-01-07 NOTE — Procedures (Signed)
ELECTROENCEPHALOGRAM REPORT  Date of Study: 01/02/2020  Patient's Name: Valerie [name] MRN: [id number] Date of Birth: 1965-07-11  Referring Provider: Dr. Ellouise Newer  Clinical History: This is a 54 year old woman with hepatocellular carcinoma, bipolar disorder, with recurrent episodes of staring/unresponsiveness. EEG for classification.  Medications: VENTOLIN HFA 108 (90 Base) MCG/ACT inhaler PROZAC 40 MG capsule HYDRODIURIL 25 MG tablet DILAUDID 2 MG tablet ENSURE COMPLETE PO ZOFRAN 8 MG tablet PROTONIX 40 MG tablet MIRALAX / GLYCOLAX 17 g packet KLOR-CON 20 MEQ tablet SENOKOT-S 8.6-50 MG tablet ALDACTONE 50 MG tablet   Technical Summary: A multichannel digital 1-hour EEG recording measured by the international 10-20 system with electrodes applied with paste and impedances below 5000 ohms performed as portable with EKG monitoring in an awake and asleep patient.  Hyperventilation was not performed. Photic stimulation was performed.  The digital EEG was referentially recorded, reformatted, and digitally filtered in a variety of bipolar and referential montages for optimal display.   Description: The patient is awake and asleep during the recording.  During maximal wakefulness, there is a symmetric, medium voltage 9 Hz posterior dominant rhythm that attenuates with eye opening. This is admixed with occasional diffuse 4-5 Hz theta and 2-3 Hz delta slowing of the waking background, at times with triphasic appearance.  During drowsiness and sleep, there is an increase in theta and delta slowing of the background.  Vertex waves and symmetric sleep spindles were seen. Photic stimulation produced excellent driving response, no epileptiform abnormalities.  There were no epileptiform discharges or electrographic seizures seen.    EKG lead was unremarkable.  Impression: This 1-hour awake and asleep EEG is abnormal due to mild to moderate diffuse slowing of the waking background.  Clinical  Correlation of the above findings indicates diffuse cerebral dysfunction that is non-specific in etiology and can be seen with hypoxic/ischemic injury, toxic/metabolic encephalopathies, neurodegenerative disorders, or medication effect.  Triphasic waves are typically seen with hepatic encephalopathy, but can be seen with other metabolic disturbances.The absence of epileptiform discharges does not rule out a clinical diagnosis of epilepsy.  Clinical correlation is advised.   Ellouise Newer, M.D.

## 2020-01-07 NOTE — Telephone Encounter (Signed)
Scheduled appointments per 9/27 los. Patient is aware of upcoming appointments dates and times.

## 2020-01-08 ENCOUNTER — Other Ambulatory Visit: Payer: Self-pay

## 2020-01-08 ENCOUNTER — Other Ambulatory Visit: Payer: Medicaid Other

## 2020-01-08 ENCOUNTER — Inpatient Hospital Stay: Payer: Medicaid Other

## 2020-01-08 DIAGNOSIS — Z5112 Encounter for antineoplastic immunotherapy: Secondary | ICD-10-CM | POA: Diagnosis not present

## 2020-01-08 DIAGNOSIS — C220 Liver cell carcinoma: Secondary | ICD-10-CM

## 2020-01-08 LAB — CMP (CANCER CENTER ONLY)
ALT: 38 U/L (ref 0–44)
AST: 96 U/L — ABNORMAL HIGH (ref 15–41)
Albumin: 2.4 g/dL — ABNORMAL LOW (ref 3.5–5.0)
Alkaline Phosphatase: 116 U/L (ref 38–126)
Anion gap: 8 (ref 5–15)
BUN: 58 mg/dL — ABNORMAL HIGH (ref 6–20)
CO2: 23 mmol/L (ref 22–32)
Calcium: 8.7 mg/dL — ABNORMAL LOW (ref 8.9–10.3)
Chloride: 102 mmol/L (ref 98–111)
Creatinine: 3.36 mg/dL (ref 0.44–1.00)
GFR, Est AFR Am: 17 mL/min — ABNORMAL LOW (ref >60)
GFR, Estimated: 15 mL/min — ABNORMAL LOW (ref >60)
Glucose, Bld: 107 mg/dL — ABNORMAL HIGH (ref 70–99)
Potassium: 4.6 mmol/L (ref 3.5–5.1)
Sodium: 133 mmol/L — ABNORMAL LOW (ref 135–145)
Total Bilirubin: 1.9 mg/dL — ABNORMAL HIGH (ref 0.3–1.2)
Total Protein: 6.7 g/dL (ref 6.5–8.1)

## 2020-01-09 ENCOUNTER — Telehealth: Payer: Self-pay

## 2020-01-09 NOTE — Telephone Encounter (Signed)
Received a phone call from [place] in lab. Patient was supposed to have a UA done on 9/29. Patient was unable to give urine. Left message for patient stating that urine was needed to check her kidney function per Lattie Haw NP. Advised patient lab closes at 4pm and if she had any question to call back to the office.

## 2020-01-10 ENCOUNTER — Other Ambulatory Visit: Payer: Self-pay

## 2020-01-10 ENCOUNTER — Inpatient Hospital Stay (HOSPITAL_BASED_OUTPATIENT_CLINIC_OR_DEPARTMENT_OTHER): Payer: Medicaid Other | Admitting: Nurse Practitioner

## 2020-01-10 ENCOUNTER — Encounter: Payer: Self-pay | Admitting: Nurse Practitioner

## 2020-01-10 ENCOUNTER — Telehealth: Payer: Self-pay

## 2020-01-10 ENCOUNTER — Inpatient Hospital Stay: Payer: Medicaid Other | Attending: Oncology

## 2020-01-10 VITALS — BP 122/83 | Temp 97.9°F | Resp 19 | Ht 62.0 in | Wt 105.5 lb

## 2020-01-10 DIAGNOSIS — K5900 Constipation, unspecified: Secondary | ICD-10-CM | POA: Insufficient documentation

## 2020-01-10 DIAGNOSIS — R188 Other ascites: Secondary | ICD-10-CM | POA: Insufficient documentation

## 2020-01-10 DIAGNOSIS — N19 Unspecified kidney failure: Secondary | ICD-10-CM | POA: Insufficient documentation

## 2020-01-10 DIAGNOSIS — C7802 Secondary malignant neoplasm of left lung: Secondary | ICD-10-CM | POA: Insufficient documentation

## 2020-01-10 DIAGNOSIS — Z8616 Personal history of COVID-19: Secondary | ICD-10-CM | POA: Diagnosis not present

## 2020-01-10 DIAGNOSIS — G893 Neoplasm related pain (acute) (chronic): Secondary | ICD-10-CM | POA: Insufficient documentation

## 2020-01-10 DIAGNOSIS — R600 Localized edema: Secondary | ICD-10-CM | POA: Insufficient documentation

## 2020-01-10 DIAGNOSIS — B1920 Unspecified viral hepatitis C without hepatic coma: Secondary | ICD-10-CM | POA: Insufficient documentation

## 2020-01-10 DIAGNOSIS — C220 Liver cell carcinoma: Secondary | ICD-10-CM

## 2020-01-10 DIAGNOSIS — K7460 Unspecified cirrhosis of liver: Secondary | ICD-10-CM | POA: Insufficient documentation

## 2020-01-10 DIAGNOSIS — C7801 Secondary malignant neoplasm of right lung: Secondary | ICD-10-CM | POA: Diagnosis not present

## 2020-01-10 DIAGNOSIS — K7290 Hepatic failure, unspecified without coma: Secondary | ICD-10-CM | POA: Insufficient documentation

## 2020-01-10 DIAGNOSIS — Z66 Do not resuscitate: Secondary | ICD-10-CM | POA: Insufficient documentation

## 2020-01-10 DIAGNOSIS — F319 Bipolar disorder, unspecified: Secondary | ICD-10-CM | POA: Insufficient documentation

## 2020-01-10 DIAGNOSIS — D696 Thrombocytopenia, unspecified: Secondary | ICD-10-CM | POA: Diagnosis not present

## 2020-01-10 DIAGNOSIS — D649 Anemia, unspecified: Secondary | ICD-10-CM | POA: Insufficient documentation

## 2020-01-10 DIAGNOSIS — R531 Weakness: Secondary | ICD-10-CM | POA: Insufficient documentation

## 2020-01-10 DIAGNOSIS — I81 Portal vein thrombosis: Secondary | ICD-10-CM | POA: Insufficient documentation

## 2020-01-10 DIAGNOSIS — R319 Hematuria, unspecified: Secondary | ICD-10-CM | POA: Diagnosis not present

## 2020-01-10 DIAGNOSIS — N390 Urinary tract infection, site not specified: Secondary | ICD-10-CM | POA: Insufficient documentation

## 2020-01-10 DIAGNOSIS — Z5112 Encounter for antineoplastic immunotherapy: Secondary | ICD-10-CM | POA: Diagnosis present

## 2020-01-10 LAB — URINALYSIS, COMPLETE (UACMP) WITH MICROSCOPIC
Bilirubin Urine: NEGATIVE
Glucose, UA: 50 mg/dL — AB
Ketones, ur: 5 mg/dL — AB
Nitrite: NEGATIVE
Protein, ur: 30 mg/dL — AB
Specific Gravity, Urine: 1.019 (ref 1.005–1.030)
pH: 5 (ref 5.0–8.0)

## 2020-01-10 LAB — CBC WITH DIFFERENTIAL (CANCER CENTER ONLY)
Abs Immature Granulocytes: 0.03 10*3/uL (ref 0.00–0.07)
Basophils Absolute: 0 10*3/uL (ref 0.0–0.1)
Basophils Relative: 0 %
Eosinophils Absolute: 0.1 10*3/uL (ref 0.0–0.5)
Eosinophils Relative: 1 %
HCT: 35.3 % — ABNORMAL LOW (ref 36.0–46.0)
Hemoglobin: 12.2 g/dL (ref 12.0–15.0)
Immature Granulocytes: 1 %
Lymphocytes Relative: 31 %
Lymphs Abs: 1.9 10*3/uL (ref 0.7–4.0)
MCH: 32.4 pg (ref 26.0–34.0)
MCHC: 34.6 g/dL (ref 30.0–36.0)
MCV: 93.9 fL (ref 80.0–100.0)
Monocytes Absolute: 0.8 10*3/uL (ref 0.1–1.0)
Monocytes Relative: 13 %
Neutro Abs: 3.3 10*3/uL (ref 1.7–7.7)
Neutrophils Relative %: 54 %
Platelet Count: 87 10*3/uL — ABNORMAL LOW (ref 150–400)
RBC: 3.76 MIL/uL — ABNORMAL LOW (ref 3.87–5.11)
RDW: 15.6 % — ABNORMAL HIGH (ref 11.5–15.5)
WBC Count: 6.2 10*3/uL (ref 4.0–10.5)
nRBC: 0 % (ref 0.0–0.2)

## 2020-01-10 LAB — CMP (CANCER CENTER ONLY)
ALT: 37 U/L (ref 0–44)
AST: 119 U/L — ABNORMAL HIGH (ref 15–41)
Albumin: 2.4 g/dL — ABNORMAL LOW (ref 3.5–5.0)
Alkaline Phosphatase: 115 U/L (ref 38–126)
Anion gap: 6 (ref 5–15)
BUN: 72 mg/dL — ABNORMAL HIGH (ref 6–20)
CO2: 24 mmol/L (ref 22–32)
Calcium: 8.6 mg/dL — ABNORMAL LOW (ref 8.9–10.3)
Chloride: 102 mmol/L (ref 98–111)
Creatinine: 3.27 mg/dL (ref 0.44–1.00)
GFR, Est AFR Am: 18 mL/min — ABNORMAL LOW (ref >60)
GFR, Estimated: 15 mL/min — ABNORMAL LOW (ref >60)
Glucose, Bld: 112 mg/dL — ABNORMAL HIGH (ref 70–99)
Potassium: 4.7 mmol/L (ref 3.5–5.1)
Sodium: 132 mmol/L — ABNORMAL LOW (ref 135–145)
Total Bilirubin: 2 mg/dL — ABNORMAL HIGH (ref 0.3–1.2)
Total Protein: 6.4 g/dL — ABNORMAL LOW (ref 6.5–8.1)

## 2020-01-10 LAB — TYPE AND SCREEN
ABO/RH(D): O NEG
Antibody Screen: NEGATIVE

## 2020-01-10 NOTE — Progress Notes (Addendum)
[facility] OFFICE PROGRESS NOTE   Diagnosis:  Hepatocellular carcinoma, cirrhosis  INTERVAL HISTORY:   Valerie [name] returns prior to scheduled follow-up for evaluation of poorly controlled pain.  She completed a cycle of Atezolizumab 12/25/2019.  Bevacizumab was held due to progressive renal failure and portal vein thrombosis.  She was seen in follow-up 01/06/2020, noted to have progressive renal failure.  Lasix and Aldactone placed on hold.  Urinalysis recommended but not completed.  Repeat labs 01/08/2020 with persistent renal failure, creatinine 3.36.  She was unable to provide a urine specimen.  She and her daughter contacted the office today to report poorly controlled pain.  They called for EMS transport to the emergency department but decided not to go due to potential long wait to be seen.  She reports continued intermittent low abdomen/back pain.  Pain is similar to what she has experienced in the recent past but rates the quality of pain as more severe.  When she takes Dilaudid the pain improves.  She took a dose at home earlier this morning and at present denies pain.  She denies nausea but had an episode of vomiting while in the lobby earlier.  She is having regular bowel movements.  She reports 2 recent episodes of vaginal bleeding, today in 1 week ago.  No other bleeding.  She denies fever, cough, shortness of breath.  Objective:  Vital signs in last 24 hours:  Blood pressure 122/83, temperature 97.9 F (36.6 C), temperature source Tympanic, resp. rate 19, height 5\' 2"  (1.575 m), weight 105 lb 8 oz (47.9 kg), SpO2 98 %.    HEENT: No thrush or ulcers.  Mucous membranes appear moist. Resp: Lungs are clear.  Breath sounds diminished right lower lung field.  No respiratory distress. Cardio: Regular rate and rhythm. GI: Abdomen is soft with diffuse tenderness.  Distention consistent with ascites.  No hepatomegaly.  No mass. Vascular: Pitting edema at the lower legs  bilaterally left greater than right. Neuro: Alert, not oriented to the year which she consistently states is 2001.  She was unable to name the current president.  She is aware she is in [place] and has cancer.    Lab Results:  Lab Results  Component Value Date   WBC 6.2 01/10/2020   HGB 12.2 01/10/2020   HCT 35.3 (L) 01/10/2020   MCV 93.9 01/10/2020   PLT 87 (L) 01/10/2020   NEUTROABS 3.3 01/10/2020    Imaging:  No results found.  Medications: I have reviewed the patient's current medications.  Assessment/Plan: 1.Liver and lung lesions concerning for metastatic cancer, ?[place] primary -CT 06/28/2019-probable cirrhosis, large heterogenous mass in the right liver extending to the right lung base with possible violation of the capsule and invasion of the right hemidiaphragm, at least 1 additional mass in the inferior right liver -06/29/2019 MRI of the abdomen-probable diffusely infiltrative hepatocellular carcinoma throughout the right lobe of the liver which appears to extend out of the hepatic capsule and invade the right hemidiaphragm, tumor thrombus involving the right portal and hepatic veins, esophageal and proximal gastric varices -06/30/2019 CT of the chest without contrast-multiple pulmonary nodules concerning for widespread metastatic disease to the lungs, large right lobe liver mass with transdiaphragmatic invasion into the lower right hemithorax involving portions of the right lower lobe -07/01/2019 -[id number] -Cycle 1atezolizumab/bevacizumab 07/12/2019 -Cycle 2 atezolizumab/bevacizumab 08/02/2019 -Cycle 3 atezolizumab/bevacizumab 08/22/2019 -CT 08/27/2019-new ascites, decreased right liver mass with diminished diaphragmatic involvement, improved pulmonary nodules -Cycle 4 atezolizumab/bevacizumab 09/19/2019 -Cycle 5 atezolizumab/bevacizumab 10/23/2019 -  Cycle 6 atezolizumab/bevacizumab 11/12/2019 -Cycle 7 atezolizumab/bevacizumab 12/03/2019 -CTs 12/06/2019-head without acute  abnormality, near complete resolution of lung nodules, stable right liver mass, new right portal vein thrombosis, changes of cirrhosis, mild splenomegaly, varices -Cycle 8 atezolizumab 12/25/2019 2. Hepatitis C -Reports prior treatment with 16 weeks of interferon ribavirin -06/29/2019 HCV RNA quantitative 634,000 3. Cirrhosis 4. Pseudoseizures-followed by neurology  EEG 01/02/2000-abnormal due to mild to moderate diffuse slowing of the waking background.  Findings indicate diffuse cerebral dysfunction that is nonspecific in etiology and can be seen with hypoxic/ischemic injury, toxic/metabolic encephalopathies, neurodegenerative disorders or medication effect.  Triphasic waves are typically seen with hepatic encephalopathy but can be seen with other metabolic disturbances.  Followed by Dr. Delice Lesch. 5. Bipolar disorder 6. Anemia and thrombocytopenia 7.History ofelevated LFTs 8.Admission with COVID-19 pneumonia 08/27/2019, treated with steroids and remdesivir 9.Abdominal pain 10.  Renal failure  Disposition: Valerie [name] appears unchanged.  Her pain seems to be controlled with Dilaudid as needed.  She will try to limit the amount of Dilaudid as possible due to confusion.  We reviewed the CBC, chemistry panel and urinalysis from today.  Kidney function is stable to slightly improved.  Urine has low-level protein, elevated white cells and red cells.  Urine culture pending.  She is scheduled to return for follow-up on 01/14/2020.  We are adding an ammonia level to labs to be collected that day.  Patient seen with Dr. Benay Spice.      Ned Card ANP/GNP-BC   01/10/2020  1:38 PM This was a shared visit with Ned Card.  Valerie [name] was interviewed and examined.  She has developed renal failure.  The creatinine is slightly improved over the past week while off of Lasix and Aldactone.  It is unclear whether the renal failure is related to liver disease, recent large volume paracentesis  procedures, or toxicity from treatment.  We will refer her to allergy if the kidney function does not improve.  Julieanne Manson, MD

## 2020-01-10 NOTE — Progress Notes (Signed)
CRITICAL VALUE STICKER  CRITICAL VALUE: Creatinine 3.27 RECEIVER (on-site recipient of call): Meriel Flavors LPN DATE & TIME NOTIFIED:  01/10/20 1329 MESSENGER (representative from lab): Deidre Ala MD NOTIFIED:  Lattie Haw NP TIME OF NOTIFICATION: 0240 RESPONSE:  Noted

## 2020-01-10 NOTE — Telephone Encounter (Signed)
Patient called in and left a message stating that she is in terrible pain and was told to stop taking her pain meds. Patient states she needs to be seen. Called patient back, patient states she is taking her pain meds and she will start talking and forgets what she is talking about. Patient had to be asked several times what was bothering her. Patient's daughter states patient is in terrible pain despite taking her pain meds. She is taking two dilaudid every four hours and it is not helping. Has called EMS and they told the patient they will take her to the ED but she maybe in the ED for twelve hours or more. Patient decided not to go. Spoke with Lattie Haw NP about patient. Patient scheduled to come in now to be seen with Lattie Haw and have labs drawn. Orders placed per Lattie Haw.

## 2020-01-11 LAB — URINE CULTURE: Culture: 30000 — AB

## 2020-01-12 ENCOUNTER — Other Ambulatory Visit: Payer: Self-pay | Admitting: Oncology

## 2020-01-13 ENCOUNTER — Telehealth: Payer: Self-pay | Admitting: Nurse Practitioner

## 2020-01-13 NOTE — Telephone Encounter (Signed)
No new appointments scheduled per 10/1 los.

## 2020-01-14 ENCOUNTER — Other Ambulatory Visit: Payer: Self-pay

## 2020-01-14 ENCOUNTER — Inpatient Hospital Stay: Payer: Medicaid Other

## 2020-01-14 ENCOUNTER — Inpatient Hospital Stay (HOSPITAL_BASED_OUTPATIENT_CLINIC_OR_DEPARTMENT_OTHER): Payer: Medicaid Other | Admitting: Nurse Practitioner

## 2020-01-14 ENCOUNTER — Encounter: Payer: Self-pay | Admitting: Nurse Practitioner

## 2020-01-14 ENCOUNTER — Telehealth: Payer: Self-pay

## 2020-01-14 VITALS — BP 135/82 | HR 84 | Temp 97.8°F | Resp 17 | Ht 62.0 in | Wt 102.3 lb

## 2020-01-14 DIAGNOSIS — C220 Liver cell carcinoma: Secondary | ICD-10-CM

## 2020-01-14 LAB — CMP (CANCER CENTER ONLY)
ALT: 46 U/L — ABNORMAL HIGH (ref 0–44)
AST: 141 U/L — ABNORMAL HIGH (ref 15–41)
Albumin: 2.4 g/dL — ABNORMAL LOW (ref 3.5–5.0)
Alkaline Phosphatase: 150 U/L — ABNORMAL HIGH (ref 38–126)
Anion gap: 6 (ref 5–15)
BUN: 69 mg/dL — ABNORMAL HIGH (ref 6–20)
CO2: 24 mmol/L (ref 22–32)
Calcium: 9 mg/dL (ref 8.9–10.3)
Chloride: 106 mmol/L (ref 98–111)
Creatinine: 2.26 mg/dL — ABNORMAL HIGH (ref 0.44–1.00)
GFR, Estimated: 24 mL/min — ABNORMAL LOW (ref >60)
Glucose, Bld: 82 mg/dL (ref 70–99)
Potassium: 4.2 mmol/L (ref 3.5–5.1)
Sodium: 136 mmol/L (ref 135–145)
Total Bilirubin: 2.2 mg/dL — ABNORMAL HIGH (ref 0.3–1.2)
Total Protein: 7.1 g/dL (ref 6.5–8.1)

## 2020-01-14 LAB — URINALYSIS, COMPLETE (UACMP) WITH MICROSCOPIC
Bilirubin Urine: NEGATIVE
Glucose, UA: NEGATIVE mg/dL
Ketones, ur: 5 mg/dL — AB
Nitrite: NEGATIVE
Protein, ur: 100 mg/dL — AB
RBC / HPF: >50 RBC/hpf — ABNORMAL HIGH (ref 0–5)
Specific Gravity, Urine: 1.023 (ref 1.005–1.030)
WBC, UA: >50 WBC/hpf — ABNORMAL HIGH (ref 0–5)
pH: 5 (ref 5.0–8.0)

## 2020-01-14 LAB — CBC WITH DIFFERENTIAL (CANCER CENTER ONLY)
Abs Immature Granulocytes: 0.05 10*3/uL (ref 0.00–0.07)
Basophils Absolute: 0.1 10*3/uL (ref 0.0–0.1)
Basophils Relative: 1 %
Eosinophils Absolute: 0.1 10*3/uL (ref 0.0–0.5)
Eosinophils Relative: 1 %
HCT: 45.6 % (ref 36.0–46.0)
Hemoglobin: 15.2 g/dL — ABNORMAL HIGH (ref 12.0–15.0)
Immature Granulocytes: 1 %
Lymphocytes Relative: 22 %
Lymphs Abs: 1.9 10*3/uL (ref 0.7–4.0)
MCH: 31.3 pg (ref 26.0–34.0)
MCHC: 33.3 g/dL (ref 30.0–36.0)
MCV: 93.8 fL (ref 80.0–100.0)
Monocytes Absolute: 0.5 10*3/uL (ref 0.1–1.0)
Monocytes Relative: 6 %
Neutro Abs: 6 10*3/uL (ref 1.7–7.7)
Neutrophils Relative %: 69 %
Platelet Count: 88 10*3/uL — ABNORMAL LOW (ref 150–400)
RBC: 4.86 MIL/uL (ref 3.87–5.11)
RDW: 16.5 % — ABNORMAL HIGH (ref 11.5–15.5)
WBC Count: 8.7 10*3/uL (ref 4.0–10.5)
nRBC: 0 % (ref 0.0–0.2)

## 2020-01-14 LAB — AMMONIA: Ammonia: 40 umol/L — ABNORMAL HIGH (ref 9–35)

## 2020-01-14 LAB — TOTAL PROTEIN, URINE DIPSTICK: Protein, ur: 30 mg/dL — AB

## 2020-01-14 NOTE — Telephone Encounter (Signed)
Scheduled patient for a paracentesis on 01/15/20 at 1115am. Called patient with appointment information. Patient verbalized understanding and will be at appointment.

## 2020-01-14 NOTE — Progress Notes (Addendum)
[facility] OFFICE PROGRESS NOTE   Diagnosis: Hepatocellular carcinoma, cirrhosis  INTERVAL HISTORY:   Valerie [name] returns as scheduled.  She completed a cycle of atezolizumab 12/25/2019.  Avastin placed on hold due to progressive renal failure and portal vein thrombosis.  She feels weak.  She has had some falls.  Abdomen continues to be distended.  She has diarrhea alternating with constipation.  Some nausea/vomiting.  Recent hematuria.  Objective:  Vital signs in last 24 hours:  Blood pressure (!) 136/106, pulse 87, temperature 97.8 F (36.6 C), temperature source Tympanic, resp. rate 17, height 5\' 2"  (1.575 m), weight 102 lb 4.8 oz (46.4 kg), SpO2 100 %.    HEENT: No thrush or ulcers. Resp: Lungs clear bilaterally. Cardio: Regular rate and rhythm. GI: Abdomen distended consistent with ascites. Vascular: Trace edema at the lower legs bilaterally.  Lab Results:  Lab Results  Component Value Date   WBC 8.7 01/14/2020   HGB 15.2 (H) 01/14/2020   HCT 45.6 01/14/2020   MCV 93.8 01/14/2020   PLT 88 (L) 01/14/2020   NEUTROABS 6.0 01/14/2020    Imaging:  No results found.  Medications: I have reviewed the patient's current medications.  Assessment/Plan: 1.Liver and lung lesions concerning for metastatic cancer, ?[place] primary -CT 06/28/2019-probable cirrhosis, large heterogenous mass in the right liver extending to the right lung base with possible violation of the capsule and invasion of the right hemidiaphragm, at least 1 additional mass in the inferior right liver -06/29/2019 MRI of the abdomen-probable diffusely infiltrative hepatocellular carcinoma throughout the right lobe of the liver which appears to extend out of the hepatic capsule and invade the right hemidiaphragm, tumor thrombus involving the right portal and hepatic veins, esophageal and proximal gastric varices -06/30/2019 CT of the chest without contrast-multiple pulmonary nodules concerning for  widespread metastatic disease to the lungs, large right lobe liver mass with transdiaphragmatic invasion into the lower right hemithorax involving portions of the right lower lobe -07/01/2019 -[id number] -Cycle 1atezolizumab/bevacizumab 07/12/2019 -Cycle 2 atezolizumab/bevacizumab 08/02/2019 -Cycle 3 atezolizumab/bevacizumab 08/22/2019 -CT 08/27/2019-new ascites, decreased right liver mass with diminished diaphragmatic involvement, improved pulmonary nodules -Cycle 4 atezolizumab/bevacizumab 09/19/2019 -Cycle 5 atezolizumab/bevacizumab 10/23/2019 -Cycle 6 atezolizumab/bevacizumab 11/12/2019 -Cycle 7 atezolizumab/bevacizumab 12/03/2019 -CTs 12/06/2019-head without acute abnormality, near complete resolution of lung nodules, stable right liver mass, new right portal vein thrombosis, changes of cirrhosis, mild splenomegaly, varices -Cycle 8 atezolizumab 12/25/2019 2. Hepatitis C -Reports prior treatment with 16 weeks of interferon ribavirin -06/29/2019 HCV RNA quantitative 634,000 3. Cirrhosis 4. Pseudoseizures-followed by neurology  EEG 01/02/2000-abnormal due to mild to moderate diffuse slowing of the waking background.  Findings indicate diffuse cerebral dysfunction that is nonspecific in etiology and can be seen with hypoxic/ischemic injury, toxic/metabolic encephalopathies, neurodegenerative disorders or medication effect.  Triphasic waves are typically seen with hepatic encephalopathy but can be seen with other metabolic disturbances.  Followed by Dr. Delice Lesch. 5. Bipolar disorder 6. Anemia and thrombocytopenia 7.History ofelevated LFTs 8.Admission with COVID-19 pneumonia 08/27/2019, treated with steroids and remdesivir 9.Abdominal pain 10.  Renal failure  Disposition: Valerie [name] appears unchanged.  Performance status is poor.  We reviewed the chemistry panel from today.  Kidney function is better.  We discussed with Valerie [name] and her daughter that the etiology of the kidney dysfunction may  be related to the diuretics or possibly treatment.  She will continue to hold Lasix and Aldactone.  Treatment for the [facility] will remain on hold.  She has recurrent ascites, symptomatic.  We are referring her  for a paracentesis this week, limit 2 L.  She has an appointment with gastroenterology next week on 01/22/2020.  We will see her in follow-up on 01/23/2020.  She will contact the office in the interim with any problems.  Patient seen with Dr. Benay Spice.    Ned Card ANP/GNP-BC   01/14/2020  2:55 PM This was a shared visit with Ned Card. Valerie [name] was interviewed and examined. The renal function has improved, but has not returned to baseline. Diuretics will remain on hold. We will hold further treatment of the [facility] until she is evaluated by gastroenterology and the renal function is further improved.  She will be treated for urinary tract infection. We will refer her for another paracentesis.  We discussed the prognosis with Valerie [name] and her daughter. I explained the expected survival of months compared to years. They understand we may consider changing to a comfort care approach depending on her clinical status over the next few weeks.  Julieanne Manson, MD

## 2020-01-15 ENCOUNTER — Telehealth: Payer: Self-pay

## 2020-01-15 ENCOUNTER — Ambulatory Visit (HOSPITAL_COMMUNITY)
Admission: RE | Admit: 2020-01-15 | Discharge: 2020-01-15 | Disposition: A | Discharge status: Home / Self Care | Payer: Medicaid Other | Source: Ambulatory Visit | Attending: Nurse Practitioner | Admitting: Nurse Practitioner

## 2020-01-15 ENCOUNTER — Other Ambulatory Visit: Payer: Self-pay | Admitting: Nurse Practitioner

## 2020-01-15 ENCOUNTER — Telehealth: Payer: Self-pay | Admitting: Nurse Practitioner

## 2020-01-15 DIAGNOSIS — C220 Liver cell carcinoma: Secondary | ICD-10-CM | POA: Diagnosis not present

## 2020-01-15 MED ORDER — SULFAMETHOXAZOLE-TRIMETHOPRIM 800-160 MG PO TABS: 1.0000 | ORAL_TABLET | Freq: Every day | ORAL | 0 refills | Status: AC

## 2020-01-15 MED ORDER — LIDOCAINE HCL 1 % IJ SOLN
INTRAMUSCULAR | Status: AC
  Filled 2020-01-15: qty 20

## 2020-01-15 NOTE — Telephone Encounter (Signed)
Called patient per Lattie Haw NP about antibiotic. Gave medication instructions. Patient verbalized understanding

## 2020-01-15 NOTE — Procedures (Signed)
PROCEDURE SUMMARY:  Successful US guided paracentesis from right abdomen.  Yielded 2L  of clear yellow fluid.  No immediate complications.  Pt tolerated well.   Specimen sent for labs.  EBL <  2 ml  Theresa Duty, NP 01/15/2020 1:11 PM

## 2020-01-15 NOTE — Telephone Encounter (Signed)
Scheduled appointments per 10/ los. Spoke to patient who is aware of appointment date and times.

## 2020-01-15 NOTE — Telephone Encounter (Signed)
-----   Message from Owens Shark, NP sent at 01/15/2020  8:46 AM EDT ----- Please let her know she appears to have a urinary tract infection based on the urinalysis from yesterday and current symptoms.  I sent a prescription to her pharmacy for Septra DS 1 tablet daily for 3 days.

## 2020-01-16 LAB — URINE CULTURE: Culture: <10000 — AB

## 2020-01-16 LAB — CYTOLOGY - NON PAP

## 2020-01-20 ENCOUNTER — Other Ambulatory Visit: Payer: Self-pay | Admitting: Nurse Practitioner

## 2020-01-20 DIAGNOSIS — C220 Liver cell carcinoma: Secondary | ICD-10-CM

## 2020-01-20 MED ORDER — HYDROMORPHONE HCL 2 MG PO TABS: 2.0000 mg | ORAL_TABLET | ORAL | 0 refills | Status: AC | PRN

## 2020-01-22 ENCOUNTER — Other Ambulatory Visit (INDEPENDENT_AMBULATORY_CARE_PROVIDER_SITE_OTHER): Payer: Medicaid Other

## 2020-01-22 ENCOUNTER — Encounter: Payer: Self-pay | Admitting: Nurse Practitioner

## 2020-01-22 ENCOUNTER — Ambulatory Visit (INDEPENDENT_AMBULATORY_CARE_PROVIDER_SITE_OTHER): Payer: Medicaid Other | Admitting: Nurse Practitioner

## 2020-01-22 VITALS — BP 126/104 | HR 92 | Ht 64.0 in | Wt 103.0 lb

## 2020-01-22 DIAGNOSIS — C220 Liver cell carcinoma: Secondary | ICD-10-CM | POA: Diagnosis not present

## 2020-01-22 DIAGNOSIS — K219 Gastro-esophageal reflux disease without esophagitis: Secondary | ICD-10-CM | POA: Diagnosis not present

## 2020-01-22 DIAGNOSIS — N179 Acute kidney failure, unspecified: Secondary | ICD-10-CM | POA: Diagnosis not present

## 2020-01-22 DIAGNOSIS — K7460 Unspecified cirrhosis of liver: Secondary | ICD-10-CM | POA: Diagnosis not present

## 2020-01-22 LAB — PROTIME-INR
INR: 1.3 ratio — ABNORMAL HIGH (ref 0.8–1.0)
Prothrombin Time: 15 s — ABNORMAL HIGH (ref 9.6–13.1)

## 2020-01-22 MED ORDER — PANTOPRAZOLE SODIUM 40 MG PO TBEC: 40.0000 mg | DELAYED_RELEASE_TABLET | Freq: Every day | ORAL | 3 refills | Status: AC

## 2020-01-22 NOTE — Progress Notes (Signed)
ASSESSMENT AND PLAN    # Cirrhosis / portal HTN and hepatocellular carcinoma diagnosed March 2021.  --She has been undergoing chemotherapy but currently being held due to renal failure. CT scan late August shows cirrhosis, stable right liver mass, thrombus appearing to occlude right portal vein and extends into proximal left portal vein.  --INR 1.6, platelets 88 --Ascites: Requiring frequent LVPs ( last one on 01/14/20 with 2 L off). Diuresis complicated by renal failure. Diuretics on hold. I don't know if she would be a candidate for TIPS with portal vein thrombosis and also renal failure.  --Esophageal and gastric varices on MRI March 2021. At some point, depending on clinical course we may consider EGD --Occasional confusion at home. No asterixis today. She is alert and oriented. Recent Ammonia 40. Daughter will monitor for now. Low threshold for starting Lactulose but she already has 4-5 BMs a day.  --Follow up with me in ~ 3 weeks. Call in the interim if getting uncomfortable with increasing ascites.  --Needs to follow a low sodium diet which we discussed in clinic. Will reiterate when we call her with lab results.   # AKI, improving but Cr still 2.26. Diuretics on hold.  --Repeat labs today  # GERD --gets pyrosis about once a week. Protonix as needed helps. Will refill Rx. Marland Kitchen    HISTORY OF PRESENT ILLNESS     Primary Gastroenterologist : [facility] - Altheimer Cellar, MD  Chief Complaint : cirrhosis  Valerie [name] is a 54 y.o. female with PMH / [place] significant for,  but not necessarily limited to: cirrhosis, HCV hepatocellular carcinoma, bipolar disorder COVID May 2021, pseudoseizures,   Patient accompanied by her daughter with whom she lives. Patient referred for management of cirrhosis which is presumably secondary to HCV. No history of significant Etoh use.  She was apparently treated with interferon at one time but viral load was positive in March of this year . She was diagnosed  with metastatic [place] in March - April 2021. Imaging showed widespread metastic disease including tumor thrombus involving the right portal and hepatic veins, esophageal varices and gastric varices. She was on chemotherapy but it is being held due to renal failure. She has recurring ascites but treatment complicated by the renal failure. Oncology has been ordering LVPs  She one in May, a couple in late August, one in early September and the last one on 10/6 with 2 L removed.  She has had no more than 4 liters removed each time. She is not on a salt restricted diet, says she was told that was no longer necessary but doesn't consume much salt anyway. She was taking lasix 20 mg / aldactone 50 mg daily but per daughter those were stopped a few weeks. Her appetite is poor.  Daughter says patient has periods of confusion. Patient has 4-5 BMs a day, sometimes solid but other times loose depending on diet.   Data Review:   12/06/19 INR 1.6  01/14/20 Cr 2.26, albumin 2.4, tbili 2.2 Hgb 15.2, platelets 88  Previous Endoscopic Evaluations / Pertinent Studies:  12/06/19 CT w/ contrast  IMPRESSION: 1. Redemonstrated cirrhotic morphology of the liver. 2. Large, ill-defined hypodense mass of the posterior right lobe of the liver, involving hepatic segments VI and VII. Although difficult to accurately measure given ill definition and single phase of contrast on this examination, this is not significantly changed compared to prior examination and in keeping with known diagnosis of hepatocellular carcinoma. 3. There is new  thrombus which appears to occlude the right portal vein and extends into the proximal left portal vein, very likely tumor thrombus in the setting of hepatocellular carcinoma. 4. Large volume ascites, similar to prior examination. 5. Mild splenomegaly. 6. Aortic Atherosclerosis (ICD10-I70.0).  Past Medical History:  Diagnosis Date  . Bipolar disorder ([place])   . Chronic headaches   .  Clotting disorder ([place])    clots in liver  . Depression   . Hepatitis C   . Hepatocellular carcinoma ([place])   . HTN (hypertension)   . Liver cirrhosis ([place])   . Lung cancer ([place])   . Pseudoseizures [facility])      Past Surgical History:  Procedure Laterality Date  . CESAREAN SECTION    . IR PARACENTESIS  12/06/2019  . IR PARACENTESIS  12/17/2019  . IR PARACENTESIS  12/30/2019   Family History  Adopted: Yes  Problem Relation Age of Onset  . COPD Father   . Hypertension Father   . Asthma Daughter   . Eczema Daughter    Social History   Tobacco Use  . Smoking status: Former Smoker    Packs/day: 1.00    Years: 20.00    Pack years: 20.00    Types: Cigarettes    Quit date: 06/2019    Years since quitting: 0.6  . Smokeless tobacco: Never Used  Vaping Use  . Vaping Use: Never used  Substance Use Topics  . 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 Drug use: Yes    Types: Marijuana, "Crack" cocaine    Comment: remote use of crack; daily use of marijuana   Current Outpatient Medications  Medication Sig Dispense Refill  . albuterol (VENTOLIN HFA) 108 (90 Base) MCG/ACT inhaler Inhale 1 puff into the lungs every 4 (four) hours as needed for wheezing or shortness of breath. 18 g 0  . FLUoxetine (PROZAC) 40 MG capsule Take 1 capsule (40 mg total) by mouth daily. 30 capsule 6  . furosemide (LASIX) 20 MG tablet Take 1 tablet (20 mg total) by mouth daily. 30 tablet 3  . HYDROmorphone (DILAUDID) 2 MG tablet Take 1-2 tablets (2-4 mg total) by mouth every 4 (four) hours as needed for severe pain. 60 tablet 0  . loperamide (IMODIUM A-D) 2 MG tablet Take 2-4 mg by mouth 4 (four) times daily as needed.     . Nutritional Supplements (ENSURE COMPLETE PO) Take 1 Can by mouth 3 (three) times daily.     . ondansetron (ZOFRAN) 8 MG tablet Take 1 tablet (8 mg total) by mouth every 8 (eight) hours as needed for nausea or vomiting. 30 tablet 2  . potassium chloride SA  (KLOR-CON) 20 MEQ tablet Take 1 tablet (20 mEq total) by mouth daily. 30 tablet 2  . spironolactone (ALDACTONE) 50 MG tablet Take 1 tablet (50 mg total) by mouth daily. 30 tablet 2  . pantoprazole (PROTONIX) 40 MG tablet Take 1 tablet (40 mg total) by mouth daily. (Patient not taking: Reported on 01/10/2020) 30 tablet 3  . polyethylene glycol (MIRALAX / GLYCOLAX) 17 g packet Take 17 g by mouth daily as needed for mild constipation.  (Patient not taking: Reported on 01/22/2020)    . sennosides-docusate sodium (SENOKOT-S) 8.6-50 MG tablet Take 1-2 tablets by mouth 2 (two) times daily as needed.  (Patient not taking: Reported on 01/22/2020)     No current facility-administered medications for this visit.   Allergies  Allergen Reactions  . Norco [  Hydrocodone-Acetaminophen] Anaphylaxis and Shortness Of Breath  . Tylenol [Acetaminophen] Other (See Comments)    Cannot take due to liver     Review of Systems: Positive for depression, confusion, itching, swelling of hands and feet, frequent urination.  All other systems reviewed and negative except where noted in HPI.   PHYSICAL EXAM :    Wt Readings from Last 3 Encounters:  01/22/20 103 lb (46.7 kg)  01/14/20 102 lb 4.8 oz (46.4 kg)  01/10/20 105 lb 8 oz (47.9 kg)    BP (!) 126/104 (BP Location: Left Arm, Patient Position: Sitting, Cuff Size: Normal)   Pulse 92   Ht 5\' 4"  (1.626 m) Comment: height measured without shoes  Wt 103 lb (46.7 kg)   BMI 17.68 kg/m  Constitutional:  Pleasant thin, frail appearing female in no acute distress. Psychiatric: Normal mood and affect. Behavior is normal. EENT: Pupils normal.  Conjunctivae are normal. No scleral icterus. Neck supple.  Cardiovascular: Normal rate, regular rhythm, 2-3+ BLE edema Pulmonary/chest: Effort normal and breath sounds normal. No wheezing, rales or rhonchi. Abdominal: Soft, moderately distended, nontender. Bowel sounds active throughout. Neurological: Alert and oriented to  person place and time. No asterixis Skin: Skin is warm and dry. No rashes noted.  Tye Savoy, NP  01/22/2020, 3:54 PM  Cc:  Referring Providers Ned Card, NP Julieanne Manson, MD

## 2020-01-22 NOTE — Patient Instructions (Signed)
Your provider has requested that you go to the basement level for lab work before leaving today. Press "B" on the elevator. The lab is located at the first door on the left as you exit the elevator.  We have sent the following medications to your pharmacy for you to pick up at your convenience:  Pantoprazole

## 2020-01-23 ENCOUNTER — Inpatient Hospital Stay: Payer: Medicaid Other

## 2020-01-23 ENCOUNTER — Other Ambulatory Visit: Payer: Self-pay

## 2020-01-23 ENCOUNTER — Telehealth: Payer: Self-pay

## 2020-01-23 ENCOUNTER — Inpatient Hospital Stay (HOSPITAL_BASED_OUTPATIENT_CLINIC_OR_DEPARTMENT_OTHER): Payer: Medicaid Other | Admitting: Nurse Practitioner

## 2020-01-23 ENCOUNTER — Encounter: Payer: Self-pay | Admitting: Nurse Practitioner

## 2020-01-23 VITALS — BP 120/86 | HR 107 | Temp 97.5°F | Resp 16 | Ht 64.0 in | Wt 102.7 lb

## 2020-01-23 DIAGNOSIS — C220 Liver cell carcinoma: Secondary | ICD-10-CM

## 2020-01-23 LAB — BASIC METABOLIC PANEL
BUN: 70 mg/dL — ABNORMAL HIGH (ref 6–23)
CO2: 23 mEq/L (ref 19–32)
Calcium: 8.6 mg/dL (ref 8.4–10.5)
Chloride: 102 mEq/L (ref 96–112)
Creatinine, Ser: 2.39 mg/dL — ABNORMAL HIGH (ref 0.40–1.20)
GFR: 22.22 mL/min — ABNORMAL LOW (ref >60.00)
Glucose, Bld: 162 mg/dL — ABNORMAL HIGH (ref 70–99)
Potassium: 4.5 mEq/L (ref 3.5–5.1)
Sodium: 132 mEq/L — ABNORMAL LOW (ref 135–145)

## 2020-01-23 LAB — CBC WITH DIFFERENTIAL (CANCER CENTER ONLY)
Abs Immature Granulocytes: 0.01 10*3/uL (ref 0.00–0.07)
Basophils Absolute: 0 10*3/uL (ref 0.0–0.1)
Basophils Relative: 0 %
Eosinophils Absolute: 0.1 10*3/uL (ref 0.0–0.5)
Eosinophils Relative: 2 %
HCT: 37.8 % (ref 36.0–46.0)
Hemoglobin: 13.1 g/dL (ref 12.0–15.0)
Immature Granulocytes: 0 %
Lymphocytes Relative: 30 %
Lymphs Abs: 1.5 10*3/uL (ref 0.7–4.0)
MCH: 32 pg (ref 26.0–34.0)
MCHC: 34.7 g/dL (ref 30.0–36.0)
MCV: 92.2 fL (ref 80.0–100.0)
Monocytes Absolute: 0.7 10*3/uL (ref 0.1–1.0)
Monocytes Relative: 15 %
Neutro Abs: 2.6 10*3/uL (ref 1.7–7.7)
Neutrophils Relative %: 53 %
Platelet Count: 69 10*3/uL — ABNORMAL LOW (ref 150–400)
RBC: 4.1 MIL/uL (ref 3.87–5.11)
RDW: 16.6 % — ABNORMAL HIGH (ref 11.5–15.5)
WBC Count: 5 10*3/uL (ref 4.0–10.5)
nRBC: 0 % (ref 0.0–0.2)

## 2020-01-23 LAB — CMP (CANCER CENTER ONLY)
ALT: 61 U/L — ABNORMAL HIGH (ref 0–44)
AST: 188 U/L (ref 15–41)
Albumin: 2.3 g/dL — ABNORMAL LOW (ref 3.5–5.0)
Alkaline Phosphatase: 199 U/L — ABNORMAL HIGH (ref 38–126)
Anion gap: 4 — ABNORMAL LOW (ref 5–15)
BUN: 69 mg/dL — ABNORMAL HIGH (ref 6–20)
CO2: 25 mmol/L (ref 22–32)
Calcium: 8.7 mg/dL — ABNORMAL LOW (ref 8.9–10.3)
Chloride: 106 mmol/L (ref 98–111)
Creatinine: 2.22 mg/dL — ABNORMAL HIGH (ref 0.44–1.00)
GFR, Estimated: 24 mL/min — ABNORMAL LOW (ref >60)
Glucose, Bld: 100 mg/dL — ABNORMAL HIGH (ref 70–99)
Potassium: 4 mmol/L (ref 3.5–5.1)
Sodium: 135 mmol/L (ref 135–145)
Total Bilirubin: 2.8 mg/dL — ABNORMAL HIGH (ref 0.3–1.2)
Total Protein: 6.7 g/dL (ref 6.5–8.1)

## 2020-01-23 MED ORDER — LACTULOSE 20 GM/30ML PO SOLN: 15.0000 mL | Freq: Two times a day (BID) | ORAL | 0 refills | Status: DC

## 2020-01-23 NOTE — Telephone Encounter (Signed)
Called in [facility] referral per Lattie Haw NP. Gave all patient information and phone number to reach patient's daughter.

## 2020-01-23 NOTE — Progress Notes (Addendum)
[facility] OFFICE PROGRESS NOTE   Diagnosis: Hepatocellular carcinoma, cirrhosis  INTERVAL HISTORY:   Ms. Szeliga returns as scheduled.  She is accompanied by her daughter.  She has been confused.  She complains of back pain.  Pain medication is effective.  Abdomen continues to be distended.  Appetite is poor.  Intermittent nausea/vomiting.  She denies diarrhea.  Objective:  Vital signs in last 24 hours:  Blood pressure 120/86, pulse (!) 107, temperature (!) 97.5 F (36.4 C), temperature source Tympanic, resp. rate 16, height 5\' 4"  (1.626 m), weight 102 lb 11.2 oz (46.6 kg), SpO2 100 %.    HEENT: No thrush or ulcers. Resp: Lungs clear anteriorly. Cardio: Regular rate and rhythm. GI: Abdomen is distended consistent with ascites. Vascular: Trace pitting edema at the lower legs bilaterally. Neuro: She is lethargic.  Follows commands.   Lab Results:  Lab Results  Component Value Date   WBC 5.0 01/23/2020   HGB 13.1 01/23/2020   HCT 37.8 01/23/2020   MCV 92.2 01/23/2020   PLT 69 (L) 01/23/2020   NEUTROABS 2.6 01/23/2020    Imaging:  No results found.  Medications: I have reviewed the patient's current medications.  Assessment/Plan: 1.Liver and lung lesions concerning for metastatic cancer, ?[place] primary -CT 06/28/2019-probable cirrhosis, large heterogenous mass in the right liver extending to the right lung base with possible violation of the capsule and invasion of the right hemidiaphragm, at least 1 additional mass in the inferior right liver -06/29/2019 MRI of the abdomen-probable diffusely infiltrative hepatocellular carcinoma throughout the right lobe of the liver which appears to extend out of the hepatic capsule and invade the right hemidiaphragm, tumor thrombus involving the right portal and hepatic veins, esophageal and proximal gastric varices -06/30/2019 CT of the chest without contrast-multiple pulmonary nodules concerning for widespread  metastatic disease to the lungs, large right lobe liver mass with transdiaphragmatic invasion into the lower right hemithorax involving portions of the right lower lobe -07/01/2019 -[id number] -Cycle 1atezolizumab/bevacizumab 07/12/2019 -Cycle 2 atezolizumab/bevacizumab 08/02/2019 -Cycle 3 atezolizumab/bevacizumab 08/22/2019 -CT 08/27/2019-new ascites, decreased right liver mass with diminished diaphragmatic involvement, improved pulmonary nodules -Cycle 4 atezolizumab/bevacizumab 09/19/2019 -Cycle 5 atezolizumab/bevacizumab 10/23/2019 -Cycle 6 atezolizumab/bevacizumab 11/12/2019 -Cycle 7 atezolizumab/bevacizumab 12/03/2019 -CTs 12/06/2019-head without acute abnormality, near complete resolution of lung nodules, stable right liver mass, new right portal vein thrombosis, changes of cirrhosis, mild splenomegaly, varices -Cycle 8 atezolizumab 12/25/2019 2. Hepatitis C -Reports prior treatment with 16 weeks of interferon ribavirin -06/29/2019 HCV RNA quantitative 634,000 3. Cirrhosis 4. Pseudoseizures-followed by neurology  EEG 01/02/2000-abnormal due to mild to moderate diffuse slowing of the waking background. Findings indicate diffuse cerebral dysfunction that is nonspecific in etiology and can be seen with hypoxic/ischemic injury, toxic/metabolic encephalopathies, neurodegenerative disorders or medication effect. Triphasic waves are typically seen with hepatic encephalopathy but can be seen with other metabolic disturbances. Followed by Dr. Delice Lesch. 5. Bipolar disorder 6. Anemia and thrombocytopenia 7.History ofelevated LFTs 8.Admission with COVID-19 pneumonia 08/27/2019, treated with steroids and remdesivir 9.Abdominal pain 10.Renal failure  Disposition: Ms. Consalvo performance status continues to decline.  We discussed with her and her daughter the decline is multifactorial secondary to cancer, liver failure/cirrhosis, renal failure.  She is not a candidate for further treatment of the  hepatocellular carcinoma.  Dr. Benay Spice discussed a referral to the hospice program.  Ms. Patchell is in agreement.  We discussed CODE STATUS.  She would like to be placed on NO CODE BLUE status.  She would like to try lactulose to see if  this helps with the confusion.  We sent a prescription to her pharmacy for lactulose 15 cc twice daily.  They understand to hold lactulose for diarrhea.  She will return for follow-up in 3 weeks.  We are available to see her sooner if needed.  Patient seen with Dr. Benay Spice.    Ned Card ANP/GNP-BC   01/23/2020  12:26 PM  This was a shared visit with Ned Card.  Ms. Aerts was interviewed and examined.  Her performance status has declined.  She has persistent renal failure.  I suspect she has developed hepatic encephalopathy in the setting of advanced cirrhosis and hepatocellular carcinoma.  She has been evaluated by gastroenterology.  I recommend changing to a comfort care approach.  Ms. Livengood and her daughter are in agreement.  We discussed CPR and ACLS issues.  She will be placed on a no CODE BLUE status.  She agrees to a referral for home hospice care.  She will begin a trial of lactulose for the altered mental status.  Julieanne Manson, MD

## 2020-01-23 NOTE — Progress Notes (Addendum)
Agree with assessment as outlined. Very difficult situation. HCV related cirrhosis with associated metastatic HCC, (+) ascites but limited in diuretic use due to renal failure. She has a portal vein thrombosis and with burden of [place] I doubt she is a TIPS candidate but can discuss with radiology. Management of ascites at this time would be large volume paracentesis as needed. She should stay on a low Na diet to reduce rate of ascites accumulation. Prognosis appears to be quite poor. I need to discuss her case with her oncology team, Dr. Learta Codding, about how aggressive to be with her care at this point in regards to varices treatment, etc, given her poor prognosis. She does have varices on imaging but can't be on beta blockade due to renal issues and difficult to manage ascites. We can consider EGD at the hospital with banding as she is at risk for variceal hemorrhage. Will await to have discussion with oncology and provide further recommendations.    Addendum: Spoke with Dr. Benay Spice. Patient is pursuing hospice, will not pursue EGD or invasive procedures at this time. Supportive care only

## 2020-01-23 NOTE — Telephone Encounter (Signed)
CRITICAL VALUE STICKER  CRITICAL VALUE: AST [id number]  RECEIVER (on-site recipient of call): Wendall Mola, RN-Carrying lab phone  DATE & TIME NOTIFIED: 01/23/2020 @ 1159  MESSENGER (representative from lab):H. Flynt  MD NOTIFIED: Ned Card, NP  TIME OF NOTIFICATION: 1202  RESPONSE: Acknowledged. No further instruction received at this time.

## 2020-01-24 ENCOUNTER — Encounter: Payer: Self-pay | Admitting: Oncology

## 2020-01-29 ENCOUNTER — Ambulatory Visit
Admission: RE | Admit: 2020-01-29 | Discharge: 2020-01-29 | Disposition: A | Discharge status: Home / Self Care | Payer: Medicaid Other | Source: Ambulatory Visit | Attending: Neurology | Admitting: Neurology

## 2020-01-29 ENCOUNTER — Other Ambulatory Visit: Payer: Self-pay | Admitting: Neurology

## 2020-01-29 ENCOUNTER — Other Ambulatory Visit: Payer: Self-pay

## 2020-01-29 DIAGNOSIS — R404 Transient alteration of awareness: Secondary | ICD-10-CM

## 2020-01-30 ENCOUNTER — Other Ambulatory Visit (HOSPITAL_COMMUNITY): Payer: Self-pay | Admitting: Diagnostic Radiology

## 2020-01-31 ENCOUNTER — Encounter: Payer: Self-pay | Admitting: Oncology

## 2020-02-03 ENCOUNTER — Other Ambulatory Visit: Payer: Self-pay | Admitting: *Deleted

## 2020-02-03 DIAGNOSIS — C220 Liver cell carcinoma: Secondary | ICD-10-CM

## 2020-02-03 NOTE — Progress Notes (Signed)
Patient sent Mychart message requesting paracentesis. OK per Dr. Benay Spice. Scheduled for 10/26 at 1045/1100 at [facility]. Notified by [employer] and voice mail.

## 2020-02-04 ENCOUNTER — Encounter: Payer: Self-pay | Admitting: Oncology

## 2020-02-04 ENCOUNTER — Other Ambulatory Visit: Payer: Self-pay | Admitting: Nurse Practitioner

## 2020-02-04 ENCOUNTER — Other Ambulatory Visit: Payer: Self-pay

## 2020-02-04 ENCOUNTER — Ambulatory Visit (HOSPITAL_COMMUNITY)
Admission: RE | Admit: 2020-02-04 | Discharge: 2020-02-04 | Disposition: A | Discharge status: Home / Self Care | Payer: Medicaid Other | Source: Ambulatory Visit | Attending: Oncology | Admitting: Oncology

## 2020-02-04 DIAGNOSIS — C220 Liver cell carcinoma: Secondary | ICD-10-CM | POA: Insufficient documentation

## 2020-02-04 MED ORDER — LIDOCAINE HCL 1 % IJ SOLN
INTRAMUSCULAR | Status: AC
  Filled 2020-02-04: qty 20

## 2020-02-04 NOTE — Procedures (Signed)
PROCEDURE SUMMARY:  Successful image-guided paracentesis from the right lower abdomen.  Yielded 3.5 liters of clear yellow fluid.  No immediate complications.  EBL: zero Patient tolerated well.   Specimen was not sent for labs.  Please see imaging section of Epic for full dictation.  Joaquim Nam PA-C 02/04/2020 12:04 PM

## 2020-02-06 ENCOUNTER — Telehealth: Payer: Self-pay | Admitting: *Deleted

## 2020-02-06 LAB — HEPATITIS C ANTIBODY
Hepatitis C Ab: REACTIVE — AB
SIGNAL TO CUT-OFF: 30.8 — ABNORMAL HIGH (ref <1.00)

## 2020-02-06 LAB — HIV ANTIBODY (ROUTINE TESTING W REFLEX): HIV 1&2 Ab, 4th Generation: NONREACTIVE

## 2020-02-06 LAB — HCV RNA,QUANTITATIVE REAL TIME PCR
HCV Quantitative Log: 5.29 Log IU/mL — ABNORMAL HIGH
HCV RNA, PCR, QN: 193000 IU/mL — ABNORMAL HIGH

## 2020-02-06 LAB — HEPATITIS B SURFACE ANTIBODY,QUALITATIVE: Hep B S Ab: NONREACTIVE

## 2020-02-06 NOTE — Telephone Encounter (Signed)
Called hospice to f/u on status of the referral sent on 01/23/20. Daughter reported hospice has not been very helpful. Was informed they reached out to her on 01/31/20 and patient declined services saying she was a very private person and did not want anyone in her home. Will discuss w/her again at 02/11/20 visit.

## 2020-02-07 ENCOUNTER — Telehealth: Payer: Self-pay | Admitting: Oncology

## 2020-02-07 NOTE — Telephone Encounter (Signed)
Scheduled per 10/14 los, patient has been called and voicemail was left.

## 2020-02-07 NOTE — Telephone Encounter (Signed)
Called per upcoming appointment, patient does not have a voicemail set up.

## 2020-02-11 ENCOUNTER — Inpatient Hospital Stay: Payer: Medicaid Other | Attending: Oncology | Admitting: Oncology

## 2020-02-12 ENCOUNTER — Telehealth: Payer: Self-pay | Admitting: *Deleted

## 2020-02-12 NOTE — Telephone Encounter (Signed)
Called and spoke w/patient's daughter, Vladimir Creeks regarding no show on 11/2. She reports they took her to brother's home in [place] over weekend and she took a dramatic decline. Was taken to hospital and now in his home w/terminal care with Hospice. They do not expect her to live more than a week or so. She will keep [place] posted.

## 2020-02-18 ENCOUNTER — Ambulatory Visit: Payer: Medicaid Other | Admitting: Nurse Practitioner

## 2020-03-11 DEATH — deceased

## 2020-03-25 ENCOUNTER — Ambulatory Visit: Payer: Medicaid Other | Admitting: Family Medicine

## 2021-08-24 IMAGING — MR MR ABDOMEN WO/W CM
18 of 20 series · 42 of 48 positions shown · IV contrast (Gadavist)
Comparison: No prior abdominal MRI. CT the abdomen and pelvis
06/28/2019.

CLINICAL DATA: 53-year-old female with liver lesion noted on prior
CT examination. Follow-up study.

EXAM:
MRI ABDOMEN WITHOUT AND WITH CONTRAST
TECHNIQUE: Multiplanar multisequence MR imaging of the abdomen was performed
both before and after the administration of intravenous contrast.
CONTRAST:  5.4mL GADAVIST GADOBUTROL 1 MMOL/ML IV SOLN

[Series 4: cor haste · coronal · 6.0mm · 1.19mm/px · 2 of 30 slices shown]
[im 1/30]
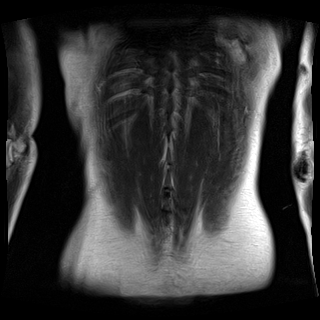
[im 30/30]
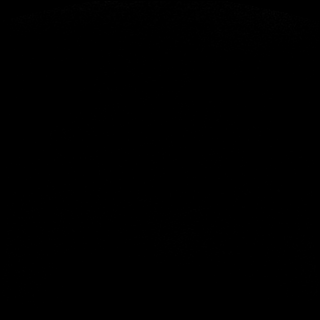

[Series 5: ax haste · axial · 6.0mm · 1.19mm/px · z∈[-125,+155]mm · 2 of 40 slices shown]
[im 1/40]
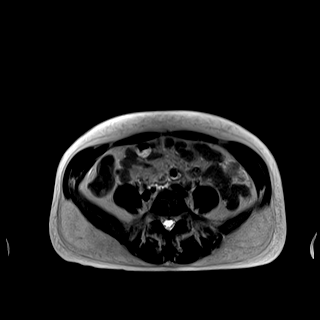
[im 40/40]
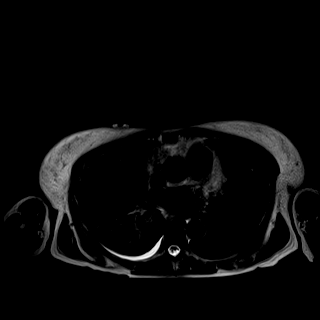

[Series 6: in phase (optional) · axial · 6.0mm · 0.74mm/px · 1 of 40 slices shown]
[im 1/40]
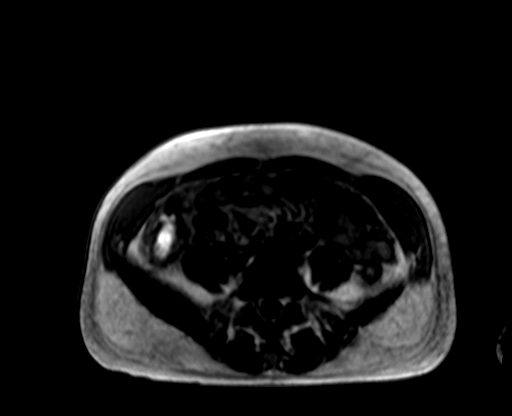

[Series 7: T1 · axial · 6.0mm · 0.74mm/px · 1 of 40 slices shown]
[im 1/40]
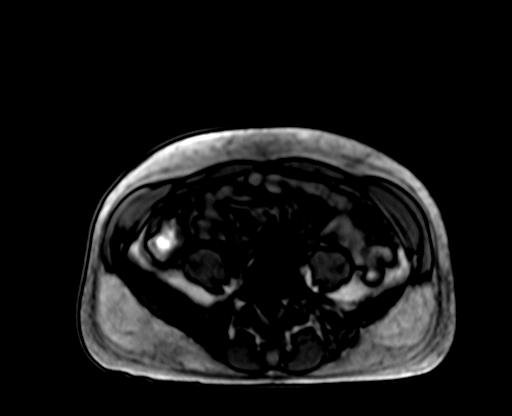

[Series 10: T2 fat-sat · axial · 6.0mm · 1.19mm/px · 1 of 40 slices shown]
[im 1/40]
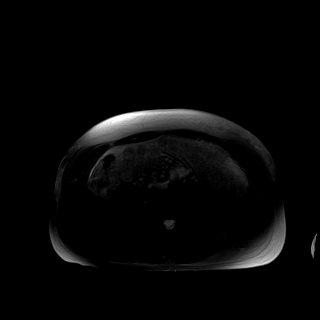

[Series 11: t1_vibe_opp-in_tra_p4_bh · axial · 3.0mm · 1.19mm/px · z∈[-127,+158]mm · 3 of 96 slices shown (1 of 2)]
[im 1/96]
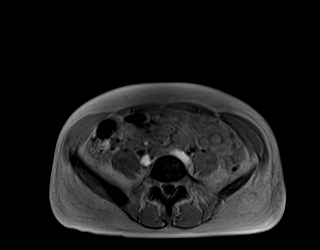
[im 48/96]
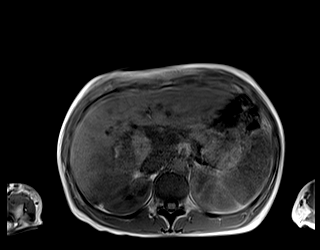
[im 96/96]
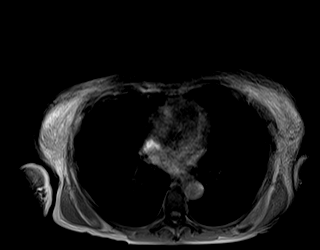

[Series 11: t1_vibe_opp-in_tra_p4_bh · axial · 3.0mm · 1.19mm/px · z∈[-127,+158]mm · 3 of 96 slices shown (2 of 2)]
[im 1/96]
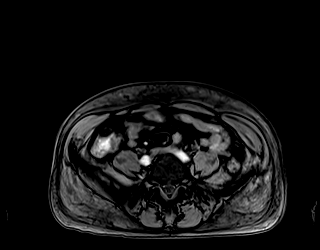
[im 48/96]
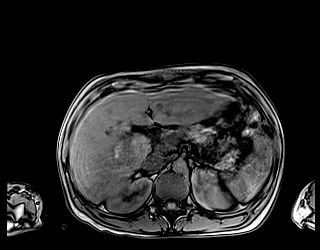
[im 96/96]
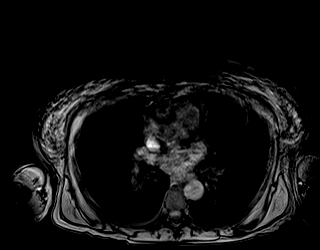

[Series 12: DWI · axial · 6.0mm · 1.42mm/px · z∈[-125,+155]mm · 4 of 120 slices shown (1 of 2)]
[im 1/120]
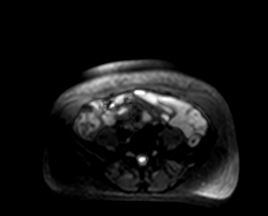
[im 40/120]
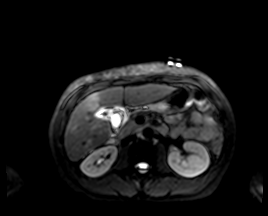
[im 80/120]
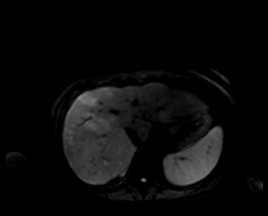
[im 120/120]
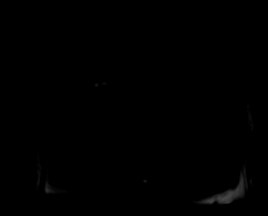

[Series 13: DWI · axial · 6.0mm · 1.42mm/px · 1 of 40 slices shown (2 of 2)]
[im 1/40]
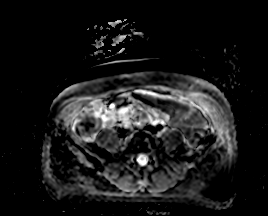

[Series 14: bSSFP · axial · 6.0mm · 0.74mm/px · 1 of 40 slices shown]
[im 1/40]
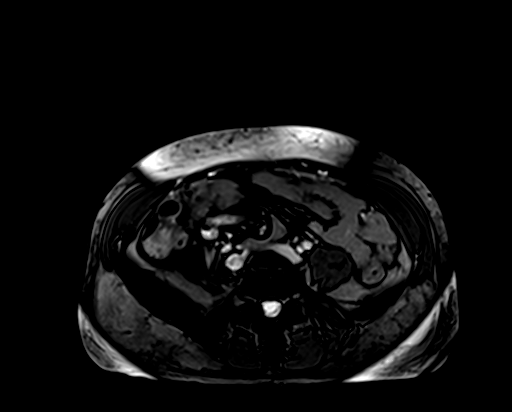

[Series 15: t1_vibe_fs_tra_p4_bh_pre · axial · 3.0mm · 1.19mm/px · z∈[-127,+158]mm · 3 of 96 slices shown]
[im 1/96]
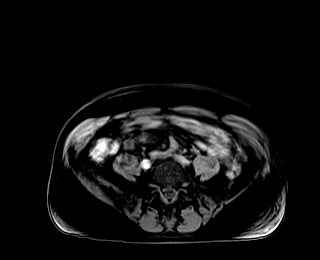
[im 48/96]
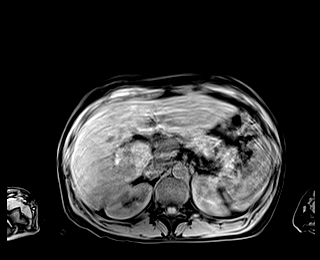
[im 96/96]
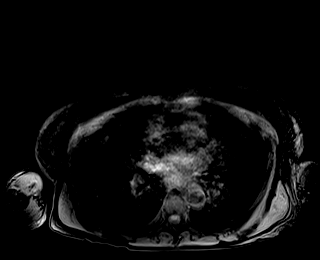

[Series 17: t1_vibe_fs_tra_p4_bh_post · axial · 3.0mm · 1.19mm/px · z∈[-127,+158]mm · 3 of 96 slices shown (1 of 4)]
[im 1/96]
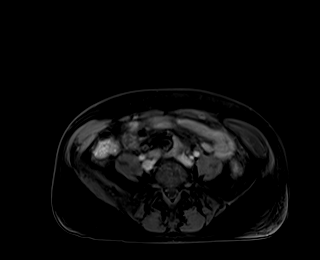
[im 48/96]
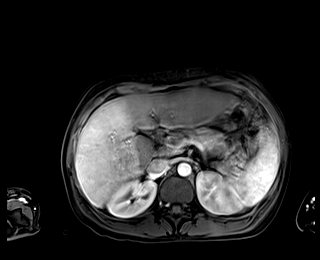
[im 96/96]
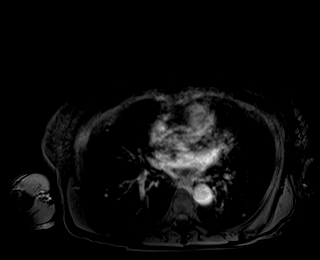

[Series 18: t1_vibe_fs_tra_p4_bh_post_sub · axial · 3.0mm · 1.19mm/px · z∈[-127,+158]mm · 3 of 96 slices shown (1 of 3)]
[im 1/96]
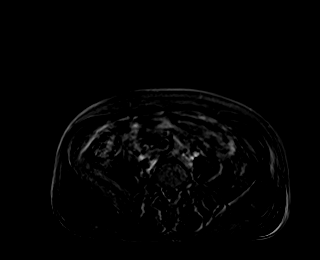
[im 48/96]
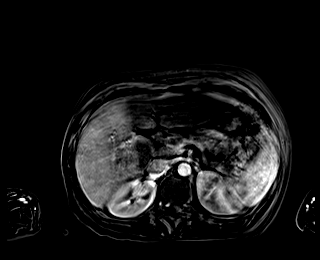
[im 96/96]
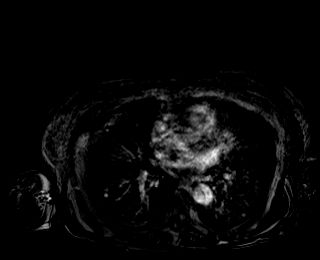

[Series 19: t1_vibe_fs_tra_p4_bh_post · axial · 3.0mm · 1.19mm/px · z∈[-127,+158]mm · 3 of 96 slices shown (2 of 4)]
[im 1/96]
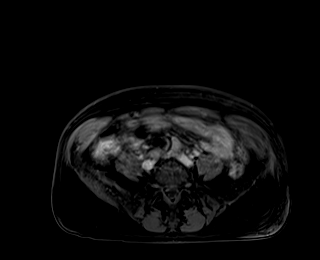
[im 48/96]
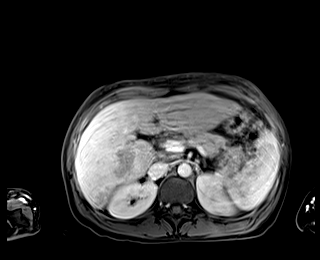
[im 96/96]
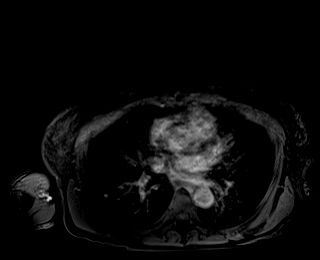

[Series 20: t1_vibe_fs_tra_p4_bh_post_sub · axial · 3.0mm · 1.19mm/px · z∈[-127,+158]mm · 3 of 96 slices shown (2 of 3)]
[im 1/96]
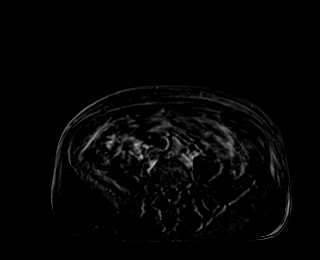
[im 48/96]
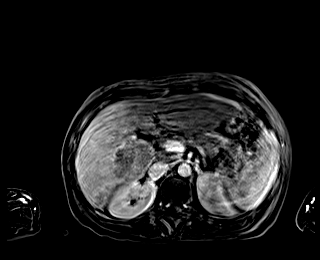
[im 96/96]
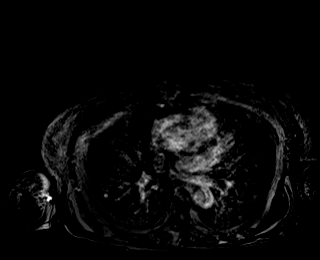

[Series 21: t1_vibe_fs_tra_p4_bh_post · axial · 3.0mm · 1.19mm/px · z∈[-127,+158]mm · 3 of 96 slices shown (3 of 4)]
[im 1/96]
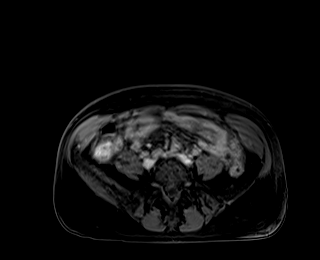
[im 48/96]
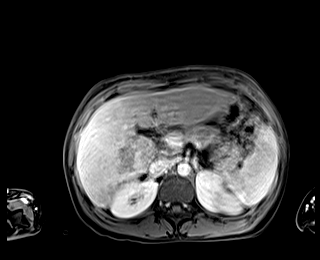
[im 96/96]
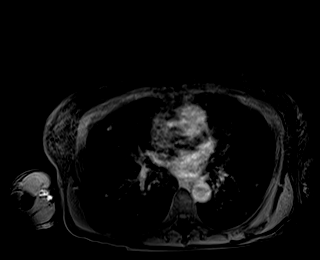

[Series 22: t1_vibe_fs_tra_p4_bh_post_sub · axial · 3.0mm · 1.19mm/px · z∈[-127,+158]mm · 3 of 96 slices shown (3 of 3)]
[im 1/96]
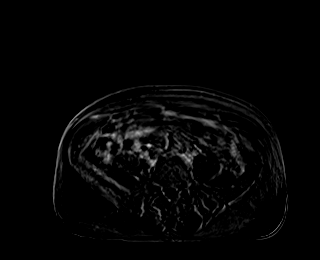
[im 48/96]
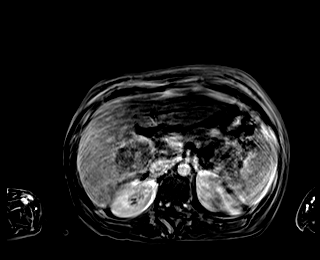
[im 96/96]
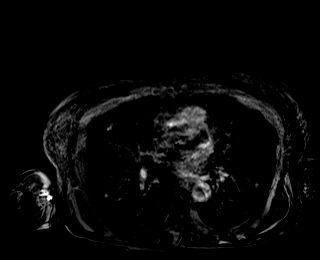

[Series 23: t1_vibe_fs_tra_p4_bh_post · axial · 3.0mm · 1.19mm/px · z∈[-127,+14]mm · 2 of 96 slices shown (4 of 4)]
[im 1/96]
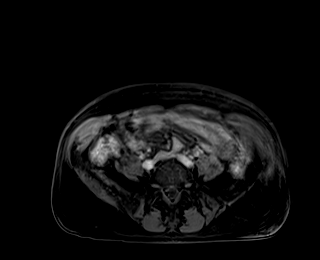
[im 48/96]
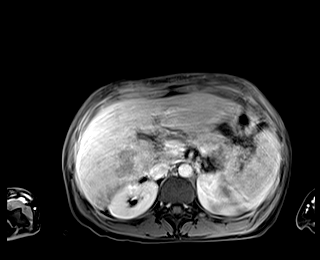

[42 of 48 positions shown; findings below may reference images not displayed]

FINDINGS: Comment: Portions of today's examination are limited by extensive
patient respiratory motion.

Lower chest: Trace right pleural effusion lying dependently.
Numerous small areas of increased signal intensity corresponding to
pulmonary nodules noted throughout the visualize lung bases.

Hepatobiliary: Liver has a slightly shrunken appearance and nodular
contour, indicative of underlying cirrhosis. There is heterogeneous
signal intensity throughout the liver, most evident in the right
lobe of the liver where there are widespread somewhat ill-defined
areas of mild T2 hyperintensity and T1 hypointensity. In the
superior aspect of segment 7 of the liver there is a well-defined
mass (axial image 9 of series 10 and coronal image 9 of series 4)
measuring 4.4 x 4.4 x 4.1 cm, which demonstrates heterogeneous
signal intensity on T1 and T2 weighted images, but generally has a
peripheral rim of T2 hypointensity, and demonstrates heterogeneous
internal enhancement on post gadolinium images without a specific
enhancement pattern. Extending cephalad from this lesion there is
additional irregular malignant appearing soft tissue which appears
to directly invade the right hemidiaphragm best appreciated on
coronal image 10 of series 4 and axial image 6 of series 5 where
this is estimated to measure approximately 8.6 x 7.2 x 3.3 cm. This
may also directly invade the inferior aspect of the right lower
lobe, although this is difficult to say for certain on today's
magnetic resonance examination. Notably, the ill-defined areas of T2
hyperintensity throughout the right lobe of the liver correspond to
relatively diffuse arterial phase hyperenhancement on post
gadolinium images which decreased on subsequent post gadolinium
delayed imaging. Markedly heterogeneous perfusion is evident
throughout the posterior aspect of the right lobe of the liver,
predominantly segments 7 and 6, suggesting widespread infiltrative
poorly defined neoplasm in these regions (although some of this
could be accounted for by the altered perfusion throughout this
portion of the liver related to portal and hepatic venous
obstruction). The right branches of the portal vein appear dilated
and do not demonstrate normal portal venous enhancement, with abrupt
truncation of the right main portal vein best appreciated on axial
image 42 of series 19), suggesting extensive tumor thrombus in the
portal venous system of the right lobe of the liver. There is also
incomplete opacification of the right hepatic vein (axial image 36
of series 19), suggesting additional tumor thrombus in the right
hepatic vein. No intra or extrahepatic biliary ductal dilatation.
Gallbladder is moderately distended with diffuse gallbladder wall
edema, but no overt surrounding inflammatory changes. No filling
defects in the gallbladder to suggest cholelithiasis.

Pancreas: No pancreatic mass. No pancreatic ductal dilatation. No
pancreatic or peripancreatic fluid collections or inflammatory
changes.

Spleen: Spleen appears borderline enlarged measuring 11.3 x 5.7 x
11.2 cm (estimated splenic volume of 361 mL) .

Adrenals/Urinary Tract: Bilateral kidneys and adrenal glands are
normal in appearance. No hydroureteronephrosis in the visualized
portions of the abdomen.

Stomach/Bowel: Visualized portions are unremarkable.

Vascular/Lymphatic: No aneurysm identified in the visualized
abdominal vasculature. Probable tumor thrombus in the right portal
and hepatic venous systems, as detailed above. Numerous serpiginous
densities adjacent to the proximal stomach and distal esophagus,
compatible with varices. No definite lymphadenopathy confidently
identified in the abdomen.

Other: No significant volume of ascites noted in the visualized
portions of the peritoneal cavity.

Musculoskeletal: No aggressive appearing osseous lesions are noted
in the visualized portions of the skeleton.
IMPRESSION: 1. Cirrhosis with probable diffusely infiltrative hepatocellular
carcinoma throughout the right lobe of the liver, including a more
well encapsulated area in segment 7 near the dome. This appears to
extend out of the hepatic capsule invading the right hemidiaphragm
which is enlarged, and potentially invading the lower right
hemithorax with possible involvement of the inferior aspect of the
right lower lobe. Trace right pleural effusion is likely malignant.
Tumor thrombus involving the right portal and hepatic venous
systems, as above.
2. Multiple small pulmonary nodules scattered throughout the
visualize lung bases compatible with widespread metastatic disease
to the lungs, better demonstrated on chest CT 06/30/2019.
3. Moderate dilatation of the gallbladder with diffuse gallbladder
wall edema. However, there are no gallstones and there is no
pericholecystic fluid. This likely reflects gallbladder wall edema
from intrinsic hepatic disease, and is not favored to indicate an
acute cholecystitis at this time.
4. Borderline splenomegaly.
5. Multiple distal esophageal and proximal gastric varices.

## 2021-08-24 IMAGING — CT CT HEAD W/O CM
4 series · 16 of 47 positions shown, 18 images · non-contrast
Comparison: Head CT 07/03/2017, brain MRI 11/27/2016

CLINICAL DATA: Headache, intracranial hemorrhage suspected.
Additional history provided: Diffuse headache with pain radiating
into neck ongoing for days.

EXAM:
CT HEAD WITHOUT CONTRAST
TECHNIQUE: Contiguous axial images were obtained from the base of the skull
through the vertex without intravenous contrast.

[Series 3: head bone · axial · 0.40mm/px · z∈[-62,-34]mm · 3 of 72 slices shown]
[im 8/72  bone]
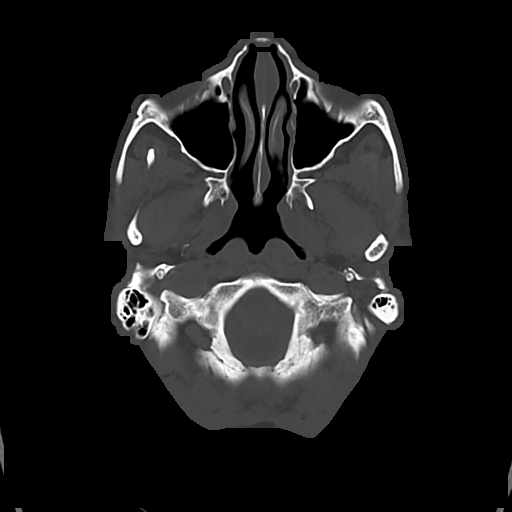
[im 15/72  bone]
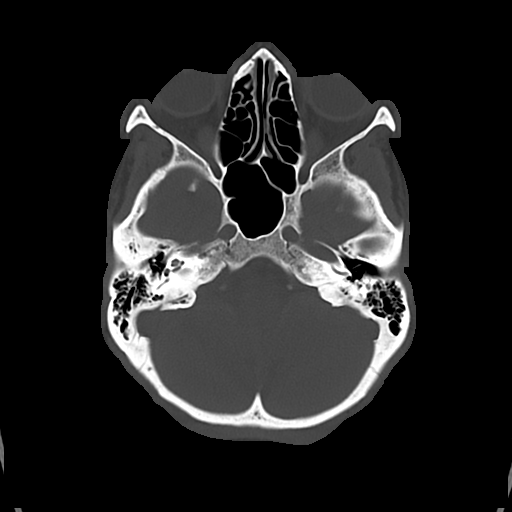
[im 22/72  bone]
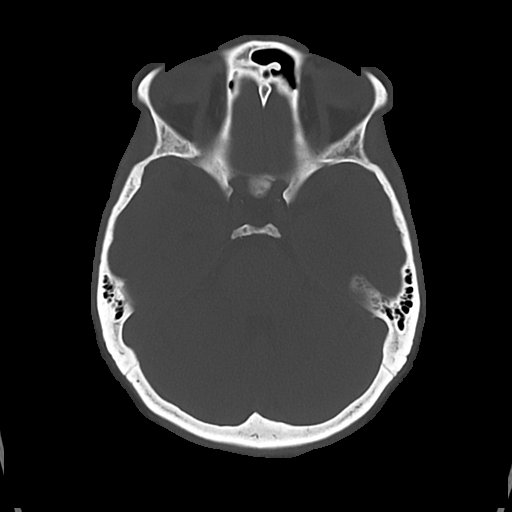

[Series 4: head wo · axial · 0.40mm/px · z∈[-61,+44]mm · 7 of 29 slices shown, 9 images]
[im 4/29  brain]
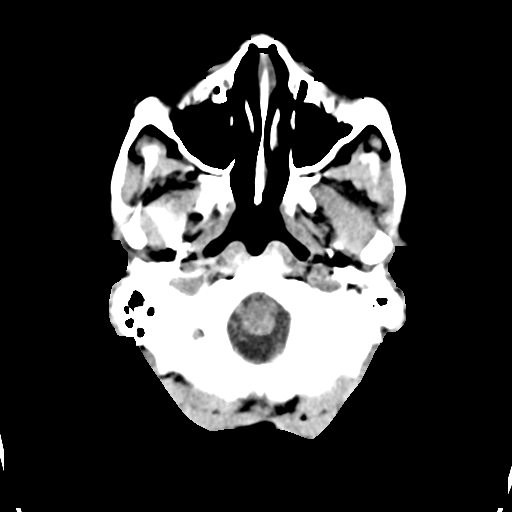
[im 4/29  bone]
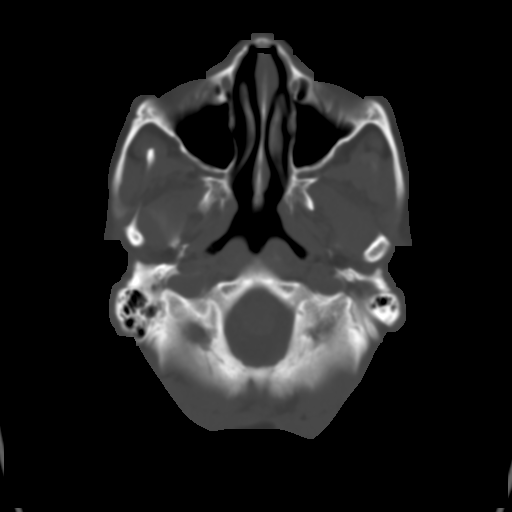
[im 8/29  brain]
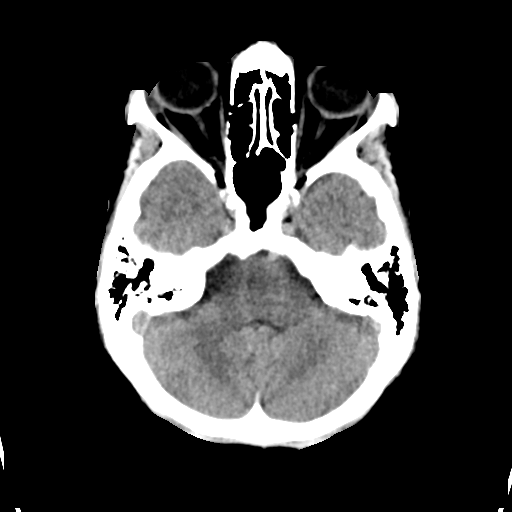
[im 11/29  brain]
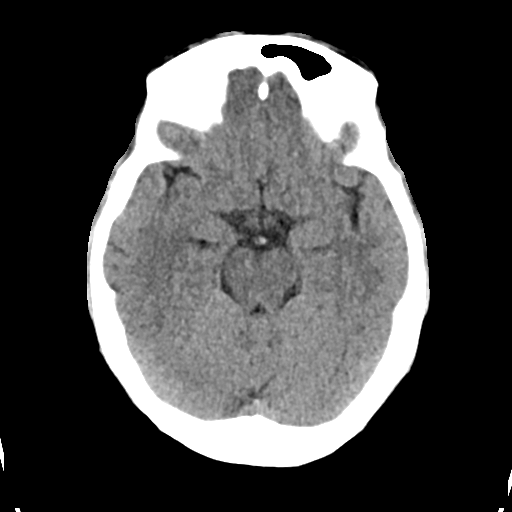
[im 15/29  brain]
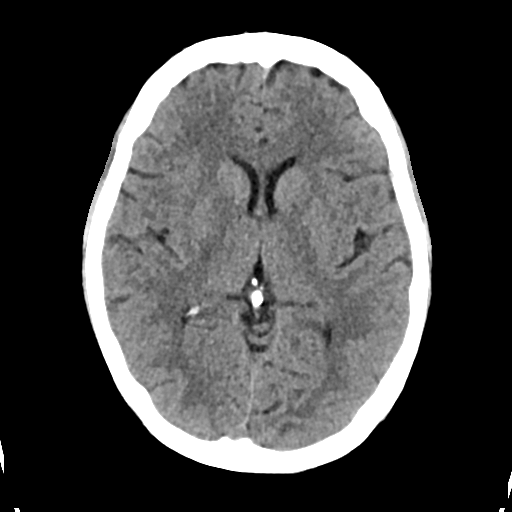
[im 18/29  brain]
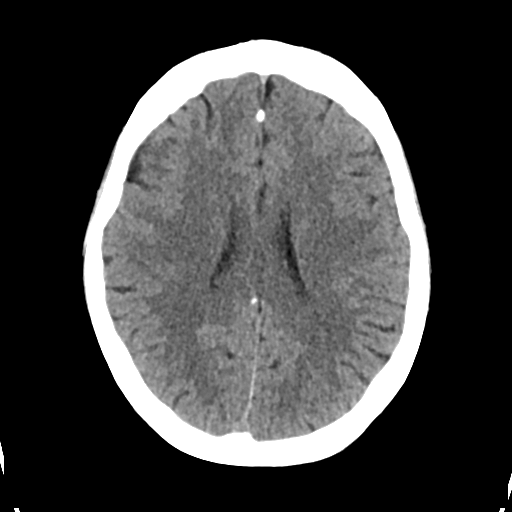
[im 18/29  bone]
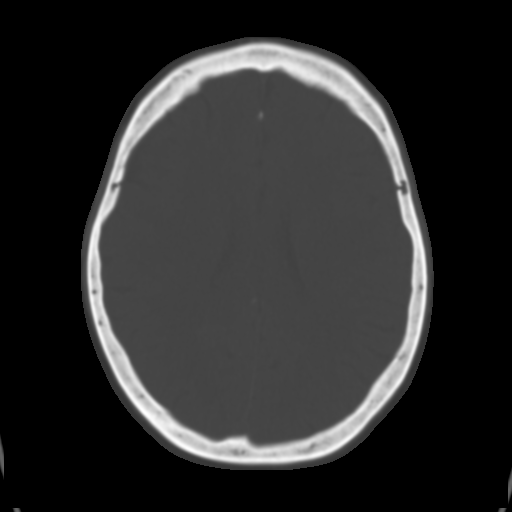
[im 22/29  brain]
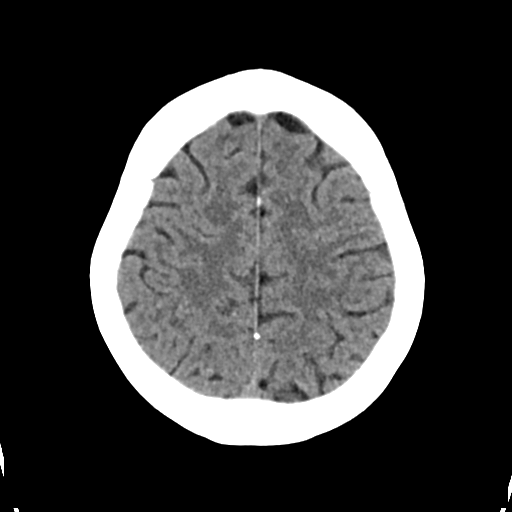
[im 25/29  brain]
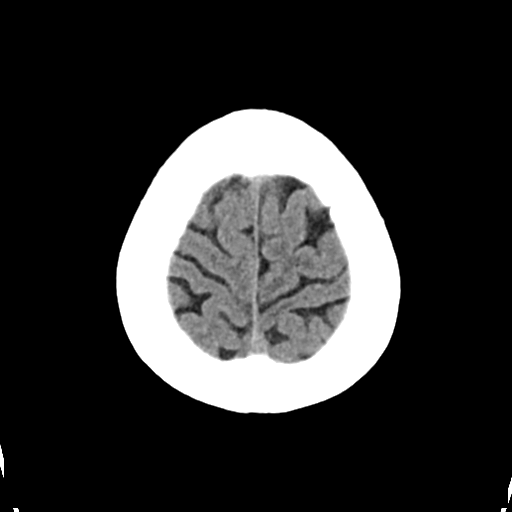

[Series 5: cor soft · coronal · 0.31mm/px · 3 of 62 slices shown]
[im 21/62  brain]
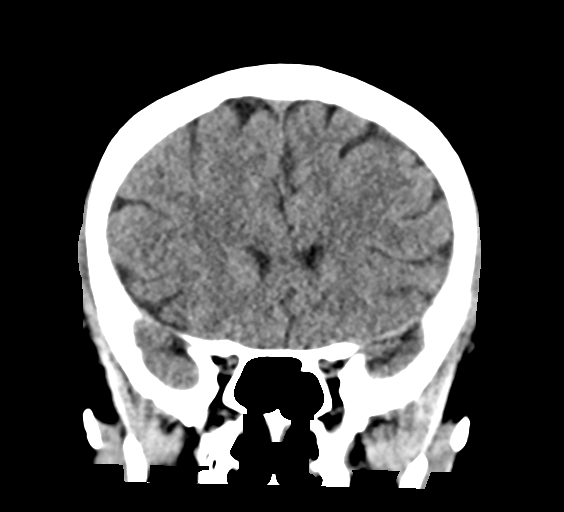
[im 28/62  brain]
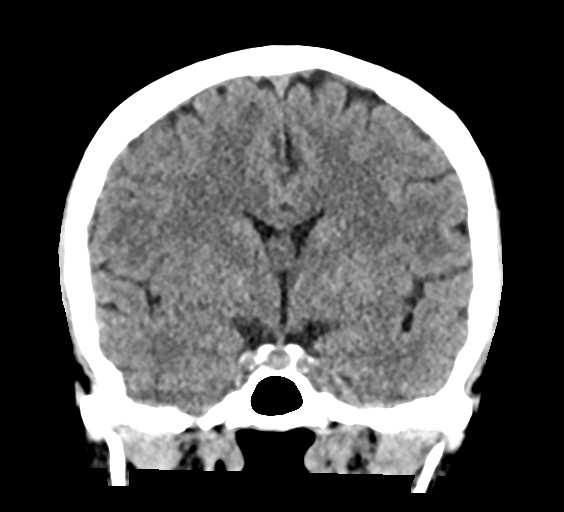
[im 34/62  brain]
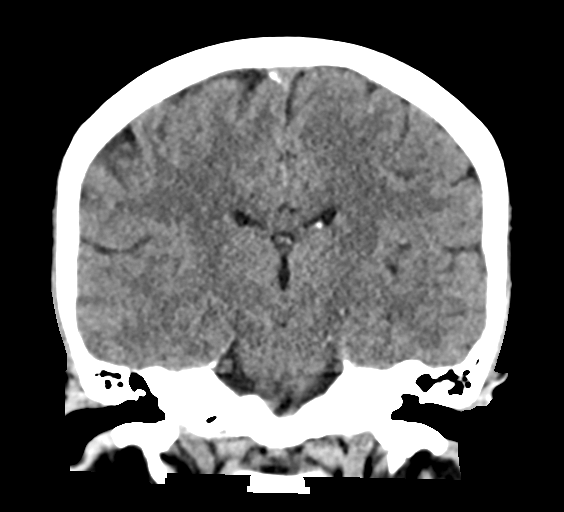

[Series 6: sag soft · sagittal · 0.32mm/px · 3 of 52 slices shown]
[im 18/52  brain]
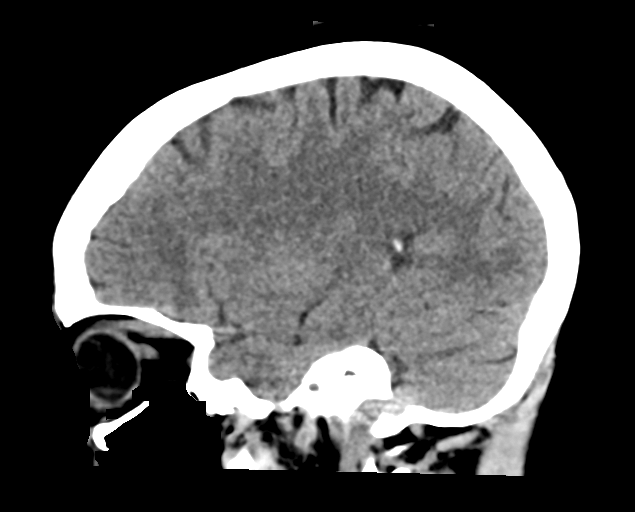
[im 26/52  brain]
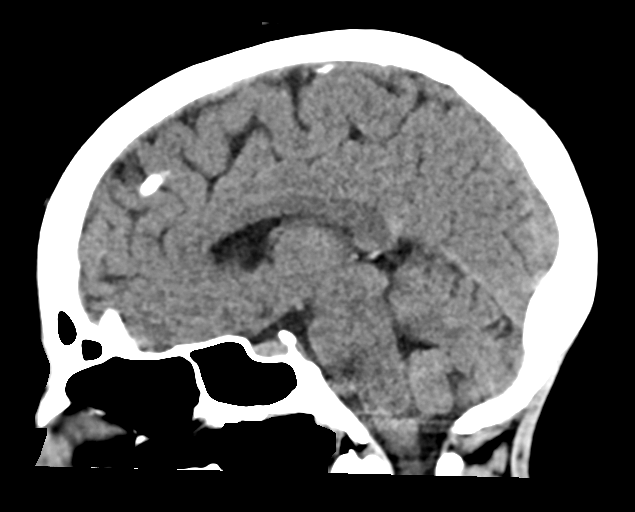
[im 35/52  brain]
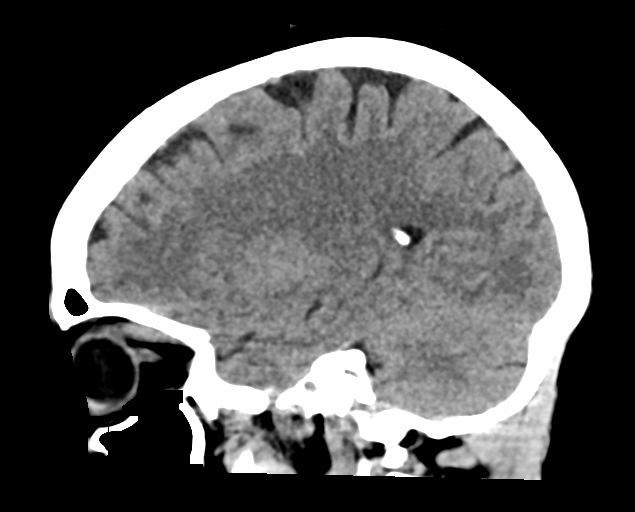

[16 of 47 positions shown; findings below may reference images not displayed]

FINDINGS: Brain: There is no evidence of acute intracranial hemorrhage,
intracranial mass, midline shift or extra-axial fluid collection.No
demarcated cortical infarction. Cerebral volume is normal for age.

Vascular: No hyperdense vessel.  Atherosclerotic calcifications

Skull: Normal. Negative for fracture or focal lesion.

Sinuses/Orbits: Visualized orbits demonstrate no acute abnormality.
No significant paranasal sinus disease or mastoid effusion at the
imaged levels.
IMPRESSION: No evidence of acute intracranial abnormality. Specifically, no
evidence of acute intracranial hemorrhage.

## 2021-08-27 IMAGING — NM NM BONE WHOLE BODY
2 series · 2 of 2 positions shown · non-contrast
Comparison: None.

CLINICAL DATA: Unknown primary malignancy.

EXAM:
NUCLEAR MEDICINE WHOLE BODY BONE SCAN
TECHNIQUE: Whole body anterior and posterior images were obtained approximately
3 hours after intravenous injection of radiopharmaceutical.
RADIOPHARMACEUTICALS:  21.9 mCi 7echnetium-22m MDP IV

[Series 1: whole body · 2.66mm/px · 1 of 1 slices shown (1 of 2)]
[im 1/1]
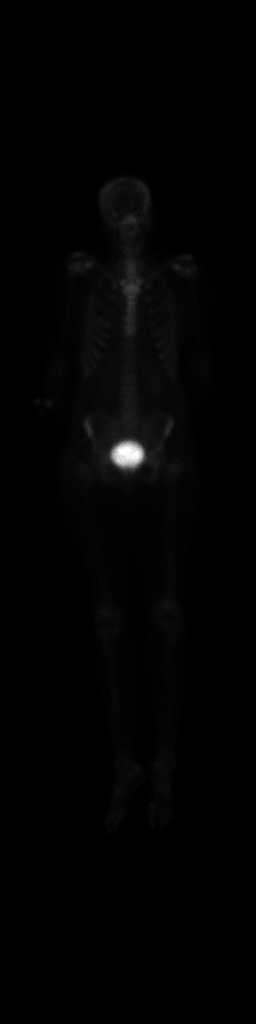

[Series 1: whole body · 2.66mm/px · 1 of 1 slices shown (2 of 2)]
[im 1/1]
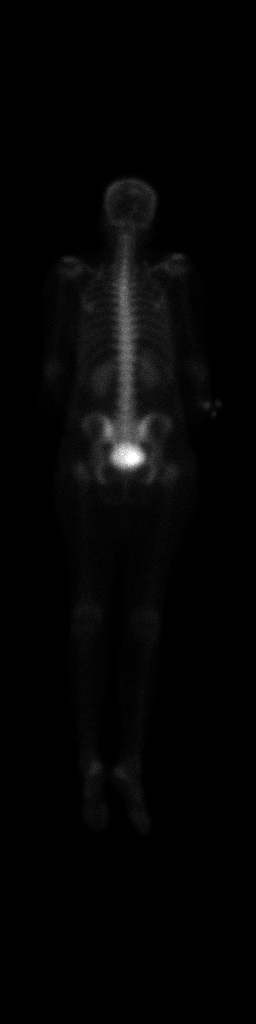

[2 of 2 positions shown; findings below may reference images not displayed]

FINDINGS: Degenerative changes are seen involving both shoulders. No other
areas of abnormal uptake are noted.
IMPRESSION: No definite scintigraphic evidence of osseous metastases.
